# Patient Record
Sex: Female | Born: 1955 | Race: White | Hispanic: No | Smoking: Former smoker
Health system: Southern US, Community
[De-identification: ages and names within clinical notes are randomized; demographics above are authoritative.]

## PROBLEM LIST (undated history)

## (undated) DIAGNOSIS — Z9189 Other specified personal risk factors, not elsewhere classified: Secondary | ICD-10-CM

## (undated) DIAGNOSIS — E079 Disorder of thyroid, unspecified: Secondary | ICD-10-CM

## (undated) DIAGNOSIS — E7800 Pure hypercholesterolemia, unspecified: Secondary | ICD-10-CM

## (undated) DIAGNOSIS — I10 Essential (primary) hypertension: Secondary | ICD-10-CM

## (undated) DIAGNOSIS — I4891 Unspecified atrial fibrillation: Secondary | ICD-10-CM

## (undated) DIAGNOSIS — D219 Benign neoplasm of connective and other soft tissue, unspecified: Secondary | ICD-10-CM

## (undated) DIAGNOSIS — Z8639 Personal history of other endocrine, nutritional and metabolic disease: Secondary | ICD-10-CM

## (undated) DIAGNOSIS — E119 Type 2 diabetes mellitus without complications: Secondary | ICD-10-CM

## (undated) DIAGNOSIS — B1920 Unspecified viral hepatitis C without hepatic coma: Secondary | ICD-10-CM

## (undated) DIAGNOSIS — N912 Amenorrhea, unspecified: Secondary | ICD-10-CM

## (undated) HISTORY — DX: Amenorrhea, unspecified: N91.2

## (undated) HISTORY — DX: Benign neoplasm of connective and other soft tissue, unspecified: D21.9

## (undated) HISTORY — DX: Essential (primary) hypertension: I10

## (undated) HISTORY — DX: Other specified personal risk factors, not elsewhere classified: Z91.89

## (undated) HISTORY — DX: Unspecified atrial fibrillation: I48.91

## (undated) HISTORY — PX: THYROIDECTOMY, PARTIAL: SHX18

## (undated) HISTORY — DX: Personal history of other endocrine, nutritional and metabolic disease: Z86.39

## (undated) HISTORY — DX: Pure hypercholesterolemia, unspecified: E78.00

## (undated) HISTORY — DX: Unspecified viral hepatitis C without hepatic coma: B19.20

## (undated) HISTORY — DX: Type 2 diabetes mellitus without complications: E11.9

## (undated) HISTORY — DX: Disorder of thyroid, unspecified: E07.9

---

## 1997-01-31 HISTORY — PX: COLPOSCOPY: SHX161

## 1998-03-03 DIAGNOSIS — B1920 Unspecified viral hepatitis C without hepatic coma: Secondary | ICD-10-CM

## 1998-03-03 HISTORY — DX: Unspecified viral hepatitis C without hepatic coma: B19.20

## 1999-03-05 ENCOUNTER — Other Ambulatory Visit: Admission: RE | Admit: 1999-03-05 | Discharge: 1999-03-05 | Payer: Self-pay | Admitting: *Deleted

## 1999-03-24 ENCOUNTER — Other Ambulatory Visit: Admission: RE | Admit: 1999-03-24 | Discharge: 1999-03-24 | Payer: Self-pay | Admitting: *Deleted

## 1999-04-29 ENCOUNTER — Ambulatory Visit (HOSPITAL_COMMUNITY): Admission: RE | Admit: 1999-04-29 | Discharge: 1999-04-29 | Payer: Self-pay | Admitting: Internal Medicine

## 1999-04-29 ENCOUNTER — Encounter: Payer: Self-pay | Admitting: Obstetrics and Gynecology

## 2000-06-15 ENCOUNTER — Other Ambulatory Visit: Admission: RE | Admit: 2000-06-15 | Discharge: 2000-06-15 | Payer: Self-pay | Admitting: *Deleted

## 2001-07-20 ENCOUNTER — Other Ambulatory Visit: Admission: RE | Admit: 2001-07-20 | Discharge: 2001-07-20 | Payer: Self-pay | Admitting: Obstetrics and Gynecology

## 2001-08-31 HISTORY — PX: ENDOMETRIAL BIOPSY: SHX622

## 2002-08-21 ENCOUNTER — Other Ambulatory Visit: Admission: RE | Admit: 2002-08-21 | Discharge: 2002-08-21 | Payer: Self-pay | Admitting: Obstetrics and Gynecology

## 2002-12-02 HISTORY — PX: ENDOMETRIAL ABLATION: SHX621

## 2002-12-28 ENCOUNTER — Ambulatory Visit (HOSPITAL_BASED_OUTPATIENT_CLINIC_OR_DEPARTMENT_OTHER): Admission: RE | Admit: 2002-12-28 | Discharge: 2002-12-28 | Payer: Self-pay | Admitting: Obstetrics and Gynecology

## 2003-04-23 ENCOUNTER — Ambulatory Visit (HOSPITAL_COMMUNITY): Admission: RE | Admit: 2003-04-23 | Discharge: 2003-04-23 | Payer: Self-pay | Admitting: Obstetrics and Gynecology

## 2003-11-06 ENCOUNTER — Other Ambulatory Visit: Admission: RE | Admit: 2003-11-06 | Discharge: 2003-11-06 | Payer: Self-pay | Admitting: Obstetrics and Gynecology

## 2005-01-11 ENCOUNTER — Other Ambulatory Visit: Admission: RE | Admit: 2005-01-11 | Discharge: 2005-01-11 | Payer: Self-pay | Admitting: Obstetrics and Gynecology

## 2005-07-22 ENCOUNTER — Ambulatory Visit (HOSPITAL_COMMUNITY): Admission: RE | Admit: 2005-07-22 | Discharge: 2005-07-22 | Payer: Self-pay | Admitting: Obstetrics and Gynecology

## 2006-02-22 ENCOUNTER — Other Ambulatory Visit: Admission: RE | Admit: 2006-02-22 | Discharge: 2006-02-22 | Payer: Self-pay | Admitting: Obstetrics & Gynecology

## 2007-04-06 ENCOUNTER — Other Ambulatory Visit: Admission: RE | Admit: 2007-04-06 | Discharge: 2007-04-06 | Payer: Self-pay | Admitting: Obstetrics & Gynecology

## 2008-07-09 ENCOUNTER — Ambulatory Visit (HOSPITAL_COMMUNITY): Admission: RE | Admit: 2008-07-09 | Discharge: 2008-07-09 | Payer: Self-pay | Admitting: Obstetrics and Gynecology

## 2009-07-14 LAB — HM PAP SMEAR

## 2009-10-03 ENCOUNTER — Ambulatory Visit (HOSPITAL_COMMUNITY): Admission: RE | Admit: 2009-10-03 | Discharge: 2009-10-03 | Payer: Self-pay | Admitting: Obstetrics and Gynecology

## 2010-05-24 ENCOUNTER — Encounter: Payer: Self-pay | Admitting: Obstetrics and Gynecology

## 2010-09-18 NOTE — Op Note (Signed)
NAME:  Melissa Boyer, Melissa Boyer                       ACCOUNT NO.:  [id number]   MEDICAL RECORD NO.:  [id number]                   PATIENT TYPE:  AMB   LOCATION:  [facility]                                 FACILITY:  [facility]   PHYSICIAN:  Laqueta Linden, M.D.                 DATE OF BIRTH:  1956/01/17   DATE OF PROCEDURE:  12/28/2002  DATE OF DISCHARGE:                                 OPERATIVE REPORT   PREOPERATIVE DIAGNOSIS:  Menorrhagia with intramural fibroids.   POSTOPERATIVE DIAGNOSES:  1. Menorrhagia with intramural fibroids.  2. Submucosal fibroid.   OPERATION/PROCEDURE:  Hydrothermal endometrial ablation.   ANESTHESIA:  General LMA.   ESTIMATED BLOOD LOSS:  Negligible.   SORBITOL NET INTAKE:  None.   COMPLICATIONS:  None.   INDICATIONS:  Melissa Boyer is a 55 year old gravida 1, para 1,  perimenopausal female who has had persistent and erratic abnormal bleeding.  She has known uterine fibroids with a uterus which has been as large as 12  to 13 weeks size.  She was initially scheduled for hysterectomy which she  subsequently cancelled.  She presented again with complaints of prolonged  and persistent abnormal bleeding to undergo hysterectomy.  On examination  she was felt to clinically have some decrease in size of her uterus.  In  addition she had previously undergone a sonohysterogram which revealed no  evidence of any large or significant intrauterine fibroids.  Endometrial  biopsy revealed benign secretory endometrium and the uterine cavity sounded  to 10 cm.  She was offered the alternative of vaginal hysterectomy versus  hydrothermal ablation as an outpatient and chose the latter.  She and her  husband saw the informed consent film and voiced their understanding and  acceptance of all risks, benefits, alternatives, and complications,  including but not limited to anesthesia risks, infection, bleeding,  postoperative discomfort and complete temporary relief of bleeding,  possible  growth of the rest of her fibroids requiring surgical intervention in the  future as well as intraoperative risks including uterine perforation and  burns related to the fluid.  She has voiced her understanding and acceptance  of all risks and limitations and agrees to proceed.   DESCRIPTION OF PROCEDURE:  The patient was taken to the operating room and  after proper identification, consents were ascertained.  She was placed on  the operating table in the supine position.  After induction of general LMA  anesthesia she was placed in the [place] stirrups and the perineum and vagina  were prepped and draped in the routine  sterile fashion.  The bladder was  entered with a red rubber catheter.  The uterus was noted to be  approximately 10 weeks size anterior and irregular, consistent with serosal  fibroids.  The speculum was placed on the vagina and the cervix was grasped  with the single-tooth tenaculum.  The internal os was gently dilated to a  #21  Pratt dilator.  The hysteroscope with the appropriate connections was  then inserted under direct vision with continuous sorbitol flow.  There was  a tight seal around the cervix with no leakage noted.  The endocervical  canal was without lesion.  The left tubal ostia was visualized.  The tubal  ostia was obscured by an approximately 1 cm submucosal fibroid.  The  remainder of the cavity was free of any lesions.  It was quite atrophic  consistent with the patient's preoperative Lupron preparation.  The scope  was then withdrawn to the level of the internal os.  The test to rule out  uterine perforations was then performed and a three-minute flush was  performed with the fluid then heated to the appropriate temperature.  A 10-  minute ablation procedure was then performed which was monitored closely by  Florentina Addison from the company as well as the circulating nurse.  There were no  problems encountered.  The fluid was then cooled with a cool  flush for one  minute.  Postoperative endometrial blanching was documented on a photograph.  The scope was then withdrawn.  There was no evidence of any sorbitol leakage  or any leakage into the vagina throughout the procedure.  The tenaculum was  removed and was hemostatic.  The patient was stable and awake on transfer to  the recovery room.  She will be observed and discharged per anesthesia  protocol.   She will be given routine verbal and written discharge instructions.  Is  told to take Advil or Aleve as needed for any cramping.  She did receive  Toradol 30 mg IV on call to the OR and 30 mg IV prior to initiating the  procedure after she was asleep.  She is to take Advil or Aleve as needed and  follow up in the office in two to three weeks.  She was given routine  written instructions and is to call for any problems.                                                 Laqueta Linden, M.D.    LKS/MEDQ  D:  12/28/2002  T:  12/28/2002  Job:  [id number]

## 2013-01-11 ENCOUNTER — Encounter: Payer: Self-pay | Admitting: Nurse Practitioner

## 2013-01-11 ENCOUNTER — Ambulatory Visit: Payer: Self-pay | Admitting: Nurse Practitioner

## 2013-09-05 ENCOUNTER — Other Ambulatory Visit: Payer: Self-pay | Admitting: Nurse Practitioner

## 2013-09-05 DIAGNOSIS — Z1231 Encounter for screening mammogram for malignant neoplasm of breast: Secondary | ICD-10-CM

## 2013-09-11 ENCOUNTER — Ambulatory Visit (HOSPITAL_COMMUNITY)
Admission: RE | Admit: 2013-09-11 | Discharge: 2013-09-11 | Disposition: A | Discharge status: Home / Self Care | Payer: BC Managed Care – PPO | Source: Ambulatory Visit | Attending: Nurse Practitioner | Admitting: Nurse Practitioner

## 2013-09-11 DIAGNOSIS — Z1231 Encounter for screening mammogram for malignant neoplasm of breast: Secondary | ICD-10-CM

## 2013-10-22 ENCOUNTER — Ambulatory Visit: Payer: Self-pay | Admitting: Nurse Practitioner

## 2013-12-03 ENCOUNTER — Telehealth: Payer: Self-pay | Admitting: Nurse Practitioner

## 2013-12-03 NOTE — Telephone Encounter (Signed)
LMTCB/ °

## 2013-12-03 NOTE — Telephone Encounter (Signed)
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

## 2013-12-04 ENCOUNTER — Ambulatory Visit: Payer: Self-pay | Admitting: Nurse Practitioner

## 2014-01-01 ENCOUNTER — Other Ambulatory Visit: Payer: Self-pay | Admitting: Nurse Practitioner

## 2014-01-01 ENCOUNTER — Encounter: Payer: Self-pay | Admitting: Nurse Practitioner

## 2014-01-01 ENCOUNTER — Ambulatory Visit (INDEPENDENT_AMBULATORY_CARE_PROVIDER_SITE_OTHER): Payer: BC Managed Care – PPO | Admitting: Nurse Practitioner

## 2014-01-01 VITALS — BP 120/76 | HR 80 | Ht 66.5 in | Wt 222.0 lb

## 2014-01-01 DIAGNOSIS — E7800 Pure hypercholesterolemia, unspecified: Secondary | ICD-10-CM

## 2014-01-01 DIAGNOSIS — Z0000 Encounter for general adult medical examination without abnormal findings: Secondary | ICD-10-CM

## 2014-01-01 DIAGNOSIS — I10 Essential (primary) hypertension: Secondary | ICD-10-CM

## 2014-01-01 DIAGNOSIS — Z91B Personal risk factor of exposure to diethylstilbestrol: Secondary | ICD-10-CM

## 2014-01-01 DIAGNOSIS — E039 Hypothyroidism, unspecified: Secondary | ICD-10-CM

## 2014-01-01 DIAGNOSIS — N6310 Unspecified lump in the right breast, unspecified quadrant: Secondary | ICD-10-CM

## 2014-01-01 DIAGNOSIS — N63 Unspecified lump in unspecified breast: Secondary | ICD-10-CM

## 2014-01-01 DIAGNOSIS — Z9189 Other specified personal risk factors, not elsewhere classified: Secondary | ICD-10-CM | POA: Insufficient documentation

## 2014-01-01 DIAGNOSIS — Z1211 Encounter for screening for malignant neoplasm of colon: Secondary | ICD-10-CM

## 2014-01-01 DIAGNOSIS — Z803 Family history of malignant neoplasm of breast: Secondary | ICD-10-CM

## 2014-01-01 DIAGNOSIS — Z01419 Encounter for gynecological examination (general) (routine) without abnormal findings: Secondary | ICD-10-CM

## 2014-01-01 HISTORY — DX: Family history of malignant neoplasm of breast: Z80.3

## 2014-01-01 HISTORY — DX: Hypothyroidism, unspecified: E03.9

## 2014-01-01 HISTORY — DX: Pure hypercholesterolemia, unspecified: E78.00

## 2014-01-01 HISTORY — DX: Other specified personal risk factors, not elsewhere classified: Z91.89

## 2014-01-01 HISTORY — DX: Essential (primary) hypertension: I10

## 2014-01-01 NOTE — Progress Notes (Signed)
Patient ID: Melissa Boyer, female   DOB: 12-15-1955, 58 y.o.   MRN: [id number] 58 y.o. G60P1001 Married Caucasian Fe here for annual exam.  No vaso symptoms.  Some vaginal dryness. New right breast lump about 2 weeks ago. No history of trauma.  Patient's last menstrual period was 12/02/2002.          Sexually active: Yes.   occasionally. The current method of family planning is post menopausal status and ablation.    Exercising: No.  The patient does not participate in regular exercise at present. Smoker:  no  Health Maintenance: Pap:  3 years ago, [facility], normal MMG:  09/11/13, Bi-Rads 1:  Negative  Colonoscopy:  never BMD:   None recently TDaP:  ? Date in our old chart Labs: PCP   reports that she has never smoked. She has never used smokeless tobacco. She reports that she does not drink alcohol or use illicit drugs.  Past Medical History  Diagnosis Date  . DES exposure in utero   . Fibroids   . Hepatitis C 11/99    2 yr tx, "remission", told Ag neg  . Amenorrhea   . Thyroid disease     hypothyroid  . Hypertension   . Hypercholesteremia     Past Surgical History  Procedure Laterality Date  . Thyroidectomy, partial      thyroid nodule excision left side  . Colposcopy  10/98    normal, no biopsy  . Endometrial biopsy  5/03  . Endometrial ablation  12/02/2002    Current Outpatient Prescriptions  Medication Sig Dispense Refill  . ALPRAZolam (XANAX) 0.5 MG tablet Take 1 tablet by mouth as needed.      Marland Kitchen levothyroxine (SYNTHROID, LEVOTHROID) 125 MCG tablet Take 1 tablet by mouth daily.      Marland Kitchen losartan-hydrochlorothiazide (HYZAAR) 100-12.5 MG per tablet Take 1 tablet by mouth daily.      . meloxicam (MOBIC) 7.5 MG tablet Take 1 tablet by mouth daily.      . metoprolol succinate (TOPROL-XL) 100 MG 24 hr tablet Take 1 tablet by mouth daily.      . pravastatin (PRAVACHOL) 80 MG tablet Take 1 tablet by mouth daily.      . triazolam (HALCION) 0.25 MG tablet Take 2 tablets by mouth  daily.       No current facility-administered medications for this visit.    Family History  Problem Relation Age of Onset  . Cancer Mother     lung,   . Hypertension Father   . Emphysema Father   . Cancer Sister     tongue  . Cancer Maternal Aunt     colon, cervical, throat    ROS:  Pertinent items are noted in HPI.  Otherwise, a comprehensive ROS was negative.  Exam:   BP 120/76  Pulse 80  Ht 5' 6.5" (1.689 m)  Wt 222 lb (100.699 kg)  BMI 35.30 kg/m2  LMP 12/02/2002 Height: 5' 6.5" (168.9 cm)  Ht Readings from Last 3 Encounters:  01/01/14 5' 6.5" (1.689 m)    General appearance: alert, cooperative and appears stated age Head: Normocephalic, without obvious abnormality, atraumatic Neck: no adenopathy, supple, symmetrical, trachea midline and thyroid normal to inspection and palpation Lungs: clear to auscultation bilaterally Breasts: normal appearance, no masses or tenderness, right breast at 2:00 position at 2 1/2 inches from the areola is a firm round nontender mass at 1 cm. No nodes and no nipple discharge Heart: regular rate and rhythm  Abdomen: soft, non-tender; no masses,  no organomegaly Extremities: extremities normal, atraumatic, no cyanosis or edema Skin: Skin color, texture, turgor normal. No rashes or lesions Lymph nodes: Cervical, supraclavicular, and axillary nodes normal. No abnormal inguinal nodes palpated Neurologic: Grossly normal   Pelvic: External genitalia:  no lesions              Urethra:  normal appearing urethra with no masses, tenderness or lesions              Bartholin's and Skene's: normal                 Vagina: normal appearing vagina with normal color and discharge, no lesions              Cervix: anteverted              Pap taken: Yes.   Bimanual Exam:  Uterus:  normal size, contour, position, consistency, mobility, non-tender              Adnexa: no mass, fullness, tenderness               Rectovaginal: Confirms               Anus:   normal sphincter tone, no lesions  A:  Well Woman with normal exam  Postmenopausal no HRT  Atrophic vaginitis  History of right breast mass X 2 weeks  History of hypothyroid, Hep C 1999, HTN, hypercholesterolemia  History of DES exposure   Ward: breast cancer  P:   Reviewed health and wellness pertinent to exam  Pap smear taken today  Mammogram is due 09/2014 - but will get diagnostic to evaluate this new mass  Will also make a new referral for patient to see Dr. Collene Mares - several years ago she let her sister who had breast cancer to take her appointment and she failed to follow and reschedule for herself.  She will check to see if sister has had BRCA testing and get the results.  Counseled on breast self exam, mammography screening, adequate intake of calcium and vitamin D, diet and exercise, Kegel's exercises return annually or prn  An After Visit Summary was printed and given to the patient.

## 2014-01-01 NOTE — Patient Instructions (Signed)

## 2014-01-03 ENCOUNTER — Telehealth: Payer: Self-pay | Admitting: Nurse Practitioner

## 2014-01-03 LAB — IPS PAP TEST WITH HPV

## 2014-01-03 NOTE — Telephone Encounter (Signed)
Spoke with patient. Advised that she is scheduled with Dr Collene Mares 09.29.2015 @ [phone]. Gave her their telephone # because she would like to contact them to schedule an appointment later in the day.

## 2014-01-09 ENCOUNTER — Ambulatory Visit
Admission: RE | Admit: 2014-01-09 | Discharge: 2014-01-09 | Disposition: A | Discharge status: Home / Self Care | Payer: BC Managed Care – PPO | Source: Ambulatory Visit | Attending: Nurse Practitioner | Admitting: Nurse Practitioner

## 2014-01-09 ENCOUNTER — Telehealth: Payer: Self-pay | Admitting: Nurse Practitioner

## 2014-01-09 DIAGNOSIS — N6310 Unspecified lump in the right breast, unspecified quadrant: Secondary | ICD-10-CM

## 2014-01-09 NOTE — Telephone Encounter (Signed)
Patient calling from the parking lot of [facility] requesting to speak with nurse about their office "refusing to do the breast ultrasound that Patty ordered."

## 2014-01-09 NOTE — Telephone Encounter (Signed)
Spoke with patient at time of incoming call.  Patient at the [facility] now and has just seen radiologist.  Radiologist advised that imaging was normal and ultrasound not needed at this time. Patient very concerned as she and Melissa Cage, FNP discussed ultrasound and patient was expecting this imaging to evaluate palpable mass that patient states has been increasing in size over the last few months.  Called the breast center and spoke with Perry. Melissa Boyer advises that ultrasounds are ordered proactively but that does not necessarily mean that imaging will be done that it is up to the radiologists discretion. Melissa Boyer would not schedule ultrasound at this time.  Spoke with Melissa Cage, FNP, advise patient to come here for breast recheck with MD and can discuss if needs additional imaging.  Spoke with patient again and advised of plan. Patient expresses concern over not having ultrasound. Patient states that she feels radiologist may have had her confused with another patient because she mentioned a previous biopsy which patient has not had. Patient expresses concern about palpable area and feels that imaging did not completely assess area. Patient concerned about family hx of cancer and recent negative 3D Mammogram. Patient is agreeable to office visit for breast recheck. Patient is scheduled for Friday 01/11/14 at 1130 with Dr. Quincy Boyer for breast check. Advised she can discuss her concerns with Dr. Quincy Boyer. Patient is agreeable to this.   Routing to [employer], FNP and Dr. Quincy Boyer.   Routing to provider for final review. Patient agreeable to disposition. Will close encounter

## 2014-01-11 ENCOUNTER — Ambulatory Visit (INDEPENDENT_AMBULATORY_CARE_PROVIDER_SITE_OTHER): Payer: BC Managed Care – PPO | Admitting: Obstetrics and Gynecology

## 2014-01-11 ENCOUNTER — Encounter: Payer: Self-pay | Admitting: Obstetrics and Gynecology

## 2014-01-11 VITALS — BP 130/90 | HR 68 | Ht 66.5 in | Wt 224.0 lb

## 2014-01-11 DIAGNOSIS — R21 Rash and other nonspecific skin eruption: Secondary | ICD-10-CM

## 2014-01-11 DIAGNOSIS — N63 Unspecified lump in unspecified breast: Secondary | ICD-10-CM

## 2014-01-11 DIAGNOSIS — N6310 Unspecified lump in the right breast, unspecified quadrant: Secondary | ICD-10-CM

## 2014-01-11 NOTE — Progress Notes (Signed)
GYNECOLOGY VISIT  PCP:  Micheal Likens.   Referring provider:   HPI: 58 y.o.   Married  Caucasian  female   G1P1001 with Patient's last menstrual period was 12/02/2002.   here for   Right breast check. 4 - 5 months ago felt a right sided lump size of a pea.  Had 3D mammogram in May which was normal.  Had annual examination and lump in right breast noted to be about quarter sized.  Had a diagnostic mammogram and was expecting an ultrasound also.  Told the study was normal.  Was told, "There was not change since her last biopsy." Patient very worried about lump.  Wants validation that she had a lump. She can feel it, her husband can feel it. Patient's maternal cousin with breast cancer.   Rash on skin.  Possible spider bite.  Denies tick bite.  Denies fevers of headaches.   Hgb:  na Urine:  na  GYNECOLOGIC HISTORY: Patient's last menstrual period was 12/02/2002. Sexually active:  yes Partner preference: female Contraception:  postmenopausal  Menopausal hormone therapy: no DES exposure:   no Blood transfusions: no   Sexually transmitted diseases: no    GYN procedures and prior surgeries: Colposcopy, Endometrial Bx, Endometrial Ablation  Last mammogram:    01/09/14 Bi-Rads Neg             Last pap and high risk HPV testing:  3 years ago, [place], normal  History of abnormal pap smear:  no   OB History   Grav Para Term Preterm Abortions TAB SAB Ect Mult Living   1 1 1       1        LIFESTYLE: Exercise:  Light activity             Tobacco: no Alcohol:no Drug use:no    Patient Active Problem List   Diagnosis Date Noted  . 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 Family history of breast cancer in first degree relative 01/01/2014  . Essential hypertension 01/01/2014  . Pure hypercholesterolemia 01/01/2014  . Unspecified hypothyroidism 01/01/2014    Past Medical History  Diagnosis Date  . DES exposure in utero   . Fibroids   . Hepatitis C 11/99    2  yr tx, "remission", told Ag neg  . Amenorrhea   . Thyroid disease     hypothyroid  . Hypertension   . Hypercholesteremia     Past Surgical History  Procedure Laterality Date  . Thyroidectomy, partial      thyroid nodule excision left side  . Colposcopy  10/98    normal, no biopsy  . Endometrial biopsy  5/03  . Endometrial ablation  12/02/2002    Current Outpatient Prescriptions  Medication Sig Dispense Refill  . ALPRAZolam (XANAX) 0.5 MG tablet Take 1 tablet by mouth as needed.      Marland Kitchen levothyroxine (SYNTHROID, LEVOTHROID) 125 MCG tablet Take 1 tablet by mouth daily.      Marland Kitchen losartan-hydrochlorothiazide (HYZAAR) 100-12.5 MG per tablet Take 1 tablet by mouth daily.      . meloxicam (MOBIC) 7.5 MG tablet Take 1 tablet by mouth daily.      . metoprolol succinate (TOPROL-XL) 100 MG 24 hr tablet Take 1 tablet by mouth daily.      . pravastatin (PRAVACHOL) 80 MG tablet Take 1 tablet by mouth daily.      . triazolam (HALCION) 0.25 MG tablet Take 2 tablets by mouth daily.  No current facility-administered medications for this visit.     ALLERGIES: Review of patient's allergies indicates no known allergies.  Family History  Problem Relation Age of Onset  . Cancer Mother     lung,   . Hypertension Father   . Emphysema Father   . Cancer Sister     tongue  . Cancer Maternal Aunt     colon, cervical, throat    History   Social History  . Marital Status: Married    Spouse Name: N/A    Number of Children: 1  . Years of Education: N/A   Occupational History  . Not on file.   Social History Main Topics  . Smoking status: Never Smoker   . Smokeless tobacco: Never Used  . Alcohol Use: No  . Drug Use: No  . Sexual Activity: Not Currently    Partners: Male   Other Topics Concern  . Not on file   Social History Narrative  . No narrative on file    ROS:  Pertinent items are noted in HPI.  PHYSICAL EXAMINATION:    BP 130/90  Pulse 68  Ht 5' 6.5" (1.689 m)  Wt  224 lb (101.606 kg)  BMI 35.62 kg/m2  LMP 12/02/2002   Wt Readings from Last 3 Encounters:  01/11/14 224 lb (101.606 kg)  01/01/14 222 lb (100.699 kg)     Ht Readings from Last 3 Encounters:  01/11/14 5' 6.5" (1.689 m)  01/01/14 5' 6.5" (1.689 m)    General appearance: alert, cooperative and appears stated age  Breasts: Inspection negative, No nipple retraction or dimpling, No nipple discharge or bleeding, No axillary or supraclavicular adenopathy, Normal to palpation without dominant masses on left.  Right breast with a ridge across breast tissue at 2 o'clock position. Skin:  Red target like rash of left side.  Has central whitish pimple like area.   ASSESSMENT  Right breast lump.  Change in self exam.  Negative diagnostic mammogram at [facility]. No ultrasound done.  Rash of left side abdominal skin.  PLAN  Will get second opinion at [facility].  Send records and request ultrasound of right breast.  Told patient that she may also have a general surgery consultation, even if all diagnostic studies are negative as there is no perfect method for detection of breast cancer.  Will follow up with PCP or urgent care for insect bite.  Our lab is closed and we cannot do any studies to rule out tick bite related infections.   An After Visit Summary was printed and given to the patient.  25 minutes face to face time of which over 50% was spent in counseling.

## 2014-01-11 NOTE — Patient Instructions (Signed)
We will call you on Monday next week to schedule an appointment at [facility] for a second opinion about your right breast lump.

## 2014-01-14 ENCOUNTER — Telehealth: Payer: Self-pay | Admitting: Obstetrics and Gynecology

## 2014-01-14 NOTE — Telephone Encounter (Signed)
Patient returning [name]'s call. °

## 2014-01-14 NOTE — Telephone Encounter (Signed)
Order faxed with cover sheet to [facility] mammography.Spoke with patient. Appointment scheduled for 9/16 at 11:30am at [facility] mammography for right breast ultrasound. Patient agreeable to date and time.  Routing to provider for final review. Patient agreeable to disposition. Will close encounter

## 2014-01-14 NOTE — Telephone Encounter (Signed)
Patient states that she was told that Olivia Mackie was going to get everything arranged and give her a call today with an appointment for a right breast ultrasound. Advised patient have order to [place]. Patient prefers Monday, Wednesday, Friday appointment. Advised would get results sent to [facility] and get appointment scheduled and call patient back. Advised may need to sign release with the Breast Center for Imaging to be sent. Spoke with [facility] who will need imaging and radiology report. Spoke with [facility] who will need patient to call over to get imaging sent to [facility]. Spoke with patient. Advised of the need to speak with the Breast Center to get results sent to [facility] Patient is agreeable and will speak with them and call back so that I can call [facility] to schedule.

## 2014-01-14 NOTE — Telephone Encounter (Signed)
Pt calling to schedule ultrasound appointment.

## 2014-01-14 NOTE — Telephone Encounter (Signed)
Pt ca;;ed Melissa Boyer back during lunch said that she called the breast center and her images will be sent.

## 2014-01-29 ENCOUNTER — Telehealth: Payer: Self-pay | Admitting: Emergency Medicine

## 2014-01-29 NOTE — Telephone Encounter (Signed)
Calling patient with message from Dr. Quincy Simmonds.  Normal results from second opinion at [facility] with Breast Ultrasound.   Does patient want referral to general surgery for evaluation of R breast lump? Can place referral if patient would like referral to [facility]. Possible Dr. Marlou Starks,, breast surgeon.

## 2014-01-30 NOTE — Progress Notes (Signed)
Encounter reviewed by Dr. Genean Adamski Silva.  

## 2014-02-01 NOTE — Telephone Encounter (Signed)
Spoke with patient. Patient states that she spoke with MD at second opinion appointment. States that right now she would like to hold off and she is anything changes before seeing general surgery. Will call back if she changes her mind.  Routing to provider for final review. Patient agreeable to disposition. Will close encounter

## 2014-03-04 ENCOUNTER — Encounter: Payer: Self-pay | Admitting: Obstetrics and Gynecology

## 2014-11-07 ENCOUNTER — Telehealth: Payer: Self-pay | Admitting: Emergency Medicine

## 2014-11-07 NOTE — Telephone Encounter (Signed)
-----   Message from Nunzio Cobbs, MD sent at 11/07/2014 12:04 AM EDT ----- Regarding: RE: Mammogram hold  OK to remove from mammogram hold.   Josefa Half ----- Message -----    From: Michele Mcalpine, RN    Sent: 11/06/2014   1:08 PM      To: Brook Oletta Lamas, MD Subject: Mammogram hold                                 Dr. Quincy Simmonds, this patient has been in mammogram hold.  She has elected to wait for evaluation by general surgery. Verline Lema spoke with her via telephone 02/01/14. Okay to remove from hold?

## 2015-01-13 ENCOUNTER — Ambulatory Visit: Payer: Self-pay | Admitting: Nurse Practitioner

## 2015-03-05 ENCOUNTER — Encounter: Payer: Self-pay | Admitting: Nurse Practitioner

## 2015-03-05 ENCOUNTER — Ambulatory Visit (INDEPENDENT_AMBULATORY_CARE_PROVIDER_SITE_OTHER): Payer: 59 | Admitting: Nurse Practitioner

## 2015-03-05 VITALS — BP 122/76 | HR 60 | Ht 66.25 in | Wt 242.0 lb

## 2015-03-05 DIAGNOSIS — Z01419 Encounter for gynecological examination (general) (routine) without abnormal findings: Secondary | ICD-10-CM

## 2015-03-05 DIAGNOSIS — D259 Leiomyoma of uterus, unspecified: Secondary | ICD-10-CM | POA: Diagnosis not present

## 2015-03-05 DIAGNOSIS — Z1211 Encounter for screening for malignant neoplasm of colon: Secondary | ICD-10-CM

## 2015-03-05 DIAGNOSIS — I10 Essential (primary) hypertension: Secondary | ICD-10-CM | POA: Diagnosis not present

## 2015-03-05 DIAGNOSIS — Z0000 Encounter for general adult medical examination without abnormal findings: Secondary | ICD-10-CM

## 2015-03-05 DIAGNOSIS — Z803 Family history of malignant neoplasm of breast: Secondary | ICD-10-CM

## 2015-03-05 DIAGNOSIS — Z23 Encounter for immunization: Secondary | ICD-10-CM | POA: Diagnosis not present

## 2015-03-05 DIAGNOSIS — N949 Unspecified condition associated with female genital organs and menstrual cycle: Secondary | ICD-10-CM | POA: Diagnosis not present

## 2015-03-05 DIAGNOSIS — Z91B Personal risk factor of exposure to diethylstilbestrol: Secondary | ICD-10-CM

## 2015-03-05 DIAGNOSIS — Z9189 Other specified personal risk factors, not elsewhere classified: Secondary | ICD-10-CM

## 2015-03-05 NOTE — Progress Notes (Signed)
Patient ID: Melissa Boyer, female   DOB: 1956-04-28, 59 y.o.   MRN: [id number] 59 y.o. G32P1001 Married  Caucasian Fe here for annual exam.  Doing well.  She has a new grand  baby - a week old.  She would like update on her TDaP.  She was also told that she had a fairly large uterine fibroid on recent ?CT/MRI of her back.  She has a history of fibroids but is having no pain or bleeding.  She also has a peri rectal skin tag/ mole that she would like checked.  Patient's last menstrual period was 12/02/2002 (approximate).          Sexually active: No.  The current method of family planning is post menopausal status.    Exercising: No.  The patient does not participate in regular exercise at present. Smoker:  no  Health Maintenance: Pap:01/01/14, Negative with neg HR HPV MMG: 09/11/13, 3D, Bi-Rads 1: Negative; Diagnostic Right 01/09/14; Bi-Rads 1: Negative Colonoscopy: Never (will send to see Dr. Collene Mares) NID:[id number] TDaP:will update today Labs: PCP in 12/2014   reports that she has never smoked. She has never used smokeless tobacco. She reports that she does not drink alcohol or use illicit drugs.  Past Medical History  Diagnosis Date  . DES exposure in utero   . Fibroids   . Hepatitis C 11/99    2 yr tx, "remission", told Ag neg  . Amenorrhea   . Thyroid disease     hypothyroid  . Hypertension   . Hypercholesteremia     Past Surgical History  Procedure Laterality Date  . Thyroidectomy, partial      thyroid nodule excision left side  . Colposcopy  10/98    normal, no biopsy  . Endometrial biopsy  5/03  . Endometrial ablation  12/02/2002    Current Outpatient Prescriptions  Medication Sig Dispense Refill  . ALPRAZolam (XANAX) 0.5 MG tablet Take 1 tablet by mouth as needed.    Marland Kitchen levothyroxine (SYNTHROID, LEVOTHROID) 125 MCG tablet Take 1 tablet by mouth daily.    Marland Kitchen losartan-hydrochlorothiazide (HYZAAR) 100-12.5 MG per tablet Take 1 tablet by mouth daily.    . meloxicam (MOBIC)  7.5 MG tablet Take 1 tablet by mouth daily.    . metoprolol succinate (TOPROL-XL) 100 MG 24 hr tablet Take 1 tablet by mouth daily.    . pravastatin (PRAVACHOL) 80 MG tablet Take 1 tablet by mouth daily.    . triazolam (HALCION) 0.25 MG tablet Take 2 tablets by mouth daily.     No current facility-administered medications for this visit.    Family History  Problem Relation Age of Onset  . Cancer Mother     lung,   . Hypertension Father   . Emphysema Father   . Cancer Sister     tongue  . Cancer Maternal Aunt     colon, cervical, throat    ROS:  Pertinent items are noted in HPI.  Otherwise, a comprehensive ROS was negative.  Exam:   BP 122/76 mmHg  Pulse 60  Ht 5' 6.25" (1.683 m)  Wt 242 lb (109.77 kg)  BMI 38.75 kg/m2  LMP 12/02/2002 (Approximate) Height: 5' 6.25" (168.3 cm) Ht Readings from Last 3 Encounters:  03/05/15 5' 6.25" (1.683 m)  01/11/14 5' 6.5" (1.689 m)  01/01/14 5' 6.5" (1.689 m)    General appearance: alert, cooperative and appears stated age Head: Normocephalic, without obvious abnormality, atraumatic Neck: no adenopathy, supple, symmetrical, trachea midline and thyroid  normal to inspection and palpation Lungs: clear to auscultation bilaterally Breasts: normal appearance, no masses or tenderness Heart: regular rate and rhythm Abdomen: soft, non-tender; no masses,  no organomegaly Extremities: extremities normal, atraumatic, no cyanosis or edema Skin: Skin color, texture, turgor normal. No rashes or lesions Lymph nodes: Cervical, supraclavicular, and axillary nodes normal. No abnormal inguinal nodes palpated Neurologic: Grossly normal   Pelvic: External genitalia:  no lesions              Urethra:  normal appearing urethra with no masses, tenderness or lesions              Bartholin's and Skene's: normal                 Vagina: normal appearing vagina with normal color and discharge, no lesions              Cervix: anteverted              Pap  taken: No. Bimanual Exam:  Uterus:  normal size, contour, position, consistency, mobility, non-tender              Adnexa: no mass, fullness, tenderness  Exam is limited due to body habitus               Rectovaginal: Confirms               Anus:  normal sphincter tone, no lesions  Chaperone present: no  A:  Well Woman with normal exam  Postmenopausal no HRT Atrophic vaginitis History of hypothyroid, Hep C 1999, HTN, hypercholesterolemia History of DES exposure, history of uterine fibroids [place]: breast cancer  Update immunization   P:   Reviewed health and wellness pertinent to exam  Pap smear as above  Mammogram is due now and will schedule  TDaP is given  Will place order for skin tag/ mole removal  IFOB is given  Will get report of CT/MRI and follow and see if we need to do anything about fibroid  Counseled on breast self exam, mammography screening, adequate intake of calcium and vitamin D, diet and exercise, Kegel's exercises return annually or prn  An After Visit Summary was printed and given to the patient.

## 2015-03-05 NOTE — Patient Instructions (Signed)

## 2015-03-06 NOTE — Progress Notes (Signed)
Encounter reviewed by Dr. Aundria Rud. Pap and HR HPV testing will be done at appointment for skin tag removal.

## 2015-03-14 ENCOUNTER — Other Ambulatory Visit: Payer: Self-pay | Admitting: Nurse Practitioner

## 2015-03-14 ENCOUNTER — Telehealth: Payer: Self-pay | Admitting: Nurse Practitioner

## 2015-03-14 DIAGNOSIS — R937 Abnormal findings on diagnostic imaging of other parts of musculoskeletal system: Secondary | ICD-10-CM

## 2015-03-14 NOTE — Telephone Encounter (Signed)
Patient called stating she is returning Tracy's call.

## 2015-03-14 NOTE — Telephone Encounter (Signed)
Patient returned call. She states she would like to wait for ultrasound until the beginning of the year. She states "I don't think this is a huge deal and I would like to wait."  Patient is advised to call back when she is ready to schedule.  cc Fortino Sic, FNP  Dr. Quincy Simmonds

## 2015-03-14 NOTE — Addendum Note (Signed)
Addended by: Michele Mcalpine on: 03/14/2015 02:13 PM   Modules accepted: Orders

## 2015-03-14 NOTE — Telephone Encounter (Signed)
Patient is informed that we now have Lumbar Spine X rays from 03/03/2010.  There is a "probable calcified fibroid vs vascular or less likely bowel related" .  I have discussed this with De. Quincy Simmonds and feel that a PUS would be good to further evaluate the "calcified fibroid'.  She has had a history of fibroids in the past - but paper chart is in storage and not available for comparison.  Will get PUS and follow.  Order is placed.

## 2015-03-16 NOTE — Telephone Encounter (Signed)
I think that it is OK to wait until beginning of next year.   Cc- Olivia Mackie Fast and [employer]

## 2015-05-26 ENCOUNTER — Telehealth: Payer: Self-pay | Admitting: Obstetrics & Gynecology

## 2015-05-26 NOTE — Telephone Encounter (Signed)
Spoke with pt regarding benefit for ultrasound. Patient understood and agreeable. Patient ready to schedule. Patient scheduled 06/19/15 with miller. Pt aware of arrival date and time. Pt aware of 72 hours cancellation policy with [id number] fee. No further questions. Ok to close

## 2015-06-19 ENCOUNTER — Other Ambulatory Visit: Payer: 59 | Admitting: Obstetrics & Gynecology

## 2015-06-19 ENCOUNTER — Other Ambulatory Visit: Payer: 59

## 2015-07-22 ENCOUNTER — Telehealth: Payer: Self-pay | Admitting: Nurse Practitioner

## 2015-07-22 NOTE — Telephone Encounter (Signed)
Order for 3D screening

## 2015-07-22 NOTE — Telephone Encounter (Signed)
After review of past Mammo and [place] and her exams.  It is OK to precede with screening 3D Mammo.

## 2015-07-22 NOTE — Telephone Encounter (Signed)
Spoke with patient. Patient states that she has a lump in her right breast that has been present for many years. Last mammogram was performed on 09/11/2013. Had a unilateral right diagnostic mammogram on 01/09/2014 and a unilateral right ultrasound on 01/16/2014. Routine mammogram was recommended in 12 months. Patient states the lump is still present in the right breast and she would like to proceed with her screening mammogram. Reports the lump is in the same area of the breast breast, but is a little tender to the touch. Patient would like to proceed with mammogram at this time. Advised I will speak with Kem Boroughs, FNP regarding imaging recommendation and return call. She is agreeable.  Kem Boroughs, FNP is it okay for the patient to have a 3D screening mammogram at this time or will she need diagnostic imaging? All results from previous imaging are available in EPIC.

## 2015-07-22 NOTE — Telephone Encounter (Signed)
Order for 3D screening mammogram to Kem Boroughs, FNP for review and signature for fax to [facility].

## 2015-07-22 NOTE — Telephone Encounter (Signed)
Patient has some questions regarding a previous mammogram. Patient also has a breast lump that is being followed. Patient says the lump feels a little different  "feels like a stitch".

## 2015-12-02 NOTE — Telephone Encounter (Signed)
Okay to close encounter?  °

## 2016-03-08 ENCOUNTER — Ambulatory Visit: Payer: 59 | Admitting: Nurse Practitioner

## 2016-03-23 ENCOUNTER — Encounter: Payer: Self-pay | Admitting: Nurse Practitioner

## 2016-03-23 ENCOUNTER — Ambulatory Visit (INDEPENDENT_AMBULATORY_CARE_PROVIDER_SITE_OTHER): Payer: BLUE CROSS/BLUE SHIELD | Admitting: Nurse Practitioner

## 2016-03-23 VITALS — BP 130/84 | HR 72 | Ht 66.25 in | Wt 246.0 lb

## 2016-03-23 DIAGNOSIS — Z01419 Encounter for gynecological examination (general) (routine) without abnormal findings: Secondary | ICD-10-CM | POA: Diagnosis not present

## 2016-03-23 DIAGNOSIS — Z803 Family history of malignant neoplasm of breast: Secondary | ICD-10-CM

## 2016-03-23 DIAGNOSIS — Z9189 Other specified personal risk factors, not elsewhere classified: Secondary | ICD-10-CM | POA: Diagnosis not present

## 2016-03-23 DIAGNOSIS — I10 Essential (primary) hypertension: Secondary | ICD-10-CM

## 2016-03-23 NOTE — Progress Notes (Signed)
Patient scheduled at [facility] mammography while in office for right breast diagnostic MMG with Ultrasound if needed -04/06/16 at 0915. Patient agreeable to date and time.

## 2016-03-23 NOTE — Patient Instructions (Signed)

## 2016-03-23 NOTE — Progress Notes (Signed)
Patient ID: Melissa Boyer, female   DOB: 1956/03/13, 60 y.o.   MRN: [id number]  60 y.o. G78P1001 Married  Caucasian Fe here for annual exam.  Has had a lot of back pain and has been out of Mobic.  She is going to restart.  She has tenderness in right breast that is noted with turning over in bed or with hugging grandchildren.  She can feel a deep tender spot and at times thinks there is a mass.  Her Mammo was done in March and US done to evaluate the right breast without any concerns.  She thinks still feels like a similar location.  Patient's last menstrual period was 12/02/2002 (approximate).          Sexually active: No.  The current method of family planning is post menopausal status.    Exercising: No.  The patient does not participate in regular exercise at present. Smoker:  no  Health Maintenance: Pap:01/01/14, Negative with neg HR HPV   (HX of DES exposure - pap yearly until age 78, then every 3 yrs.) MMG:07/29/15, 3D with ultrasound, Bi-Rads 2: Benign Findings Colonoscopy: Never - will discuss with PCP [id number] TDaP: 03/05/15 Shingles: Never but will check with PCP Pneumonia: Not indicated due to age Labs: PCP takes care of all labs   reports that she has never smoked. She has never used smokeless tobacco. She reports that she does not drink alcohol or use drugs.  Past Medical History:  Diagnosis Date  . Amenorrhea   . DES exposure in utero   . Fibroids   . Hepatitis C 11/99   2 yr tx, "remission", told Ag neg  . Hypercholesteremia   . Hypertension   . Thyroid disease    hypothyroid    Past Surgical History:  Procedure Laterality Date  . COLPOSCOPY  10/98   normal, no biopsy  . ENDOMETRIAL ABLATION  12/02/2002  . ENDOMETRIAL BIOPSY  5/03  . THYROIDECTOMY, PARTIAL     thyroid nodule excision left side    Current Outpatient Prescriptions  Medication Sig Dispense Refill  . ALPRAZolam (XANAX) 0.5 MG tablet Take 1 tablet by mouth as needed.    Marland Kitchen levothyroxine  (SYNTHROID, LEVOTHROID) 125 MCG tablet Take 1 tablet by mouth daily.    Marland Kitchen losartan-hydrochlorothiazide (HYZAAR) 100-12.5 MG per tablet Take 1 tablet by mouth daily.    . meloxicam (MOBIC) 7.5 MG tablet Take 1 tablet by mouth daily.    . metoprolol succinate (TOPROL-XL) 100 MG 24 hr tablet Take 1 tablet by mouth daily.    . pravastatin (PRAVACHOL) 80 MG tablet Take 1 tablet by mouth daily.    . triazolam (HALCION) 0.25 MG tablet Take 2 tablets by mouth daily.     No current facility-administered medications for this visit.     Family History  Problem Relation Age of Onset  . Cancer Mother     lung,   . Hypertension Father   . Emphysema Father   . Cancer Sister     tongue  . Cancer Maternal Aunt     colon, cervical, throat    ROS:  Pertinent items are noted in HPI.  Otherwise, a comprehensive ROS was negative.  Exam:   LMP 12/02/2002 (Approximate)    Ht Readings from Last 3 Encounters:  03/05/15 5' 6.25" (1.683 m)  01/11/14 5' 6.5" (1.689 m)  01/01/14 5' 6.5" (1.689 m)    General appearance: alert, cooperative and appears stated age Head: Normocephalic, without obvious abnormality, atraumatic  Neck: no adenopathy, supple, symmetrical, trachea midline and thyroid normal to inspection and palpation Lungs: clear to auscultation bilaterally Breasts: normal appearance, no masses or tenderness, for the left breast.  The right breast has some generalized tenderness at the 2:00 position close to the areola.  The breast are fibrocyctic and no mass can be felt.  No adenopathy.  Heart: regular rate and rhythm Abdomen: soft, non-tender; no masses,  no organomegaly Extremities: extremities normal, atraumatic, no cyanosis or edema Skin: Skin color, texture, turgor normal. No rashes or lesions Lymph nodes: Cervical, supraclavicular, and axillary nodes normal. No abnormal inguinal nodes palpated Neurologic: Grossly normal   Pelvic: External genitalia:  no lesions              Urethra:   normal appearing urethra with no masses, tenderness or lesions              Bartholin's and Skene's: normal                 Vagina: normal appearing vagina with normal color and discharge, no lesions              Cervix: anteverted              Pap taken: Yes.   Bimanual Exam:  Uterus:  normal size, contour, position, consistency, mobility, non-tender              Adnexa: no mass, fullness, tenderness               Rectovaginal: Confirms               Anus:  normal sphincter tone, no lesions  Chaperone present: yes  A:  Well Woman with normal exam  Postmenopausal no HRT Atrophic vaginitis History of hypothyroid, Hep C 1999, HTN, hypercholesterolemia History of DES exposure, history of uterine fibroids [place]: breast cancer             Update immunization   P:   Reviewed health and wellness pertinent to exam  Pap smear as above  Mammogram will be scheduled for diagnostic and [place] if needed  Counseled on breast self exam, mammography screening, adequate intake of calcium and vitamin D, diet and exercise return annually or prn  An After Visit Summary was printed and given to the patient.

## 2016-03-24 NOTE — Progress Notes (Signed)
Reviewed personally.  M. Suzanne Siyana Erney, MD.  

## 2016-03-26 LAB — PAP IG (IMAGE GUIDED)

## 2016-05-06 ENCOUNTER — Encounter: Payer: Self-pay | Admitting: Nurse Practitioner

## 2016-05-21 ENCOUNTER — Telehealth: Payer: Self-pay | Admitting: Nurse Practitioner

## 2016-05-21 NOTE — Telephone Encounter (Signed)
Called and left patient a message to call back and ask for Melissa Boyer re: need to locate MRI or CT scan done prior to her AEX on 03/2015.  On an audit, we never received a copy of this for review.  Cc: Gay Filler for [employer].

## 2016-05-24 NOTE — Telephone Encounter (Signed)
I called and spoke with the patient about this.  She said, "The only imaging I recall having done around that time was a breast ultrasound in 2015.  I don't remember any MRI or CT scan in the last few years but a lot has gone on for me.  I have had some more breast issues but am getting care for that at [facility] and you all don't need to help me with that right now.  I am glad you called but I am doing fine and don't think I need anything right now."

## 2016-05-24 NOTE — Telephone Encounter (Signed)
Call to patient, Left message to call back. Calling to follow-up on pelvic ultrasound that was ordered but never completed. It was ordered due to fibroid seen on pelvic MRI but we never received these results.

## 2016-06-07 NOTE — Telephone Encounter (Signed)
Call to patient. Left message calling to follow-up reagrding an appointment. Nothing wrong. Call back to [place] at her convenience.

## 2016-06-29 NOTE — Telephone Encounter (Signed)
Patient has not returned calls. Okay to close encounter or is further follow up needed?

## 2016-06-29 NOTE — Telephone Encounter (Addendum)
No patient response. Please advise on next step.

## 2016-06-30 NOTE — Telephone Encounter (Signed)
Patient has a normal pelvic exam documented in November 2017.  I am closing the encounter.

## 2017-04-04 ENCOUNTER — Ambulatory Visit: Payer: BLUE CROSS/BLUE SHIELD | Admitting: Nurse Practitioner

## 2017-04-05 ENCOUNTER — Ambulatory Visit: Payer: BLUE CROSS/BLUE SHIELD | Admitting: Certified Nurse Midwife

## 2017-05-26 ENCOUNTER — Ambulatory Visit: Payer: BLUE CROSS/BLUE SHIELD | Admitting: Certified Nurse Midwife

## 2017-05-26 ENCOUNTER — Telehealth: Payer: Self-pay | Admitting: Certified Nurse Midwife

## 2017-05-26 NOTE — Telephone Encounter (Signed)
Patient cancelled appointment for today because she has the flu. No dnka fee or letter.

## 2017-05-26 NOTE — Progress Notes (Deleted)
62 y.o. G1P1001 Married  {Race/ethnicity:17218} Fe here for annual exam.    Patient's last menstrual period was 12/02/2002 (approximate).          Sexually active: {yes no:314532}  The current method of family planning is post menopausal status.    Exercising: {yes no:314532}  {types:19826} Smoker:  {YES NO:22349}  Health Maintenance: Pap:  01/01/14 neg HPV HR neg (Hx of DES exposure, pap yearly until age 80, then every 13yrs), 03-23-16 neg , 16/18 genotype neg History of Abnormal Pap: {YES NO:22349} MMG:  07-29-15 bilateral & f/u neded 04-06-16 rt breast u/s neg Self Breast exams: {YES NO:22349} Colonoscopy:  *** BMD:   *** TDaP:  2016 Shingles: *** Pneumonia: *** Hep C and HIV: *** Labs: ***   reports that  has never smoked. she has never used smokeless tobacco. She reports that she does not drink alcohol or use drugs.  Past Medical History:  Diagnosis Date  . Amenorrhea   . DES exposure in utero   . Fibroids   . Hepatitis C 11/99   2 yr tx, "remission", told Ag neg  . Hypercholesteremia   . Hypertension   . Thyroid disease    hypothyroid    Past Surgical History:  Procedure Laterality Date  . COLPOSCOPY  10/98   normal, no biopsy  . ENDOMETRIAL ABLATION  12/02/2002  . ENDOMETRIAL BIOPSY  5/03  . THYROIDECTOMY, PARTIAL     thyroid nodule excision left side    Current Outpatient Medications  Medication Sig Dispense Refill  . ALPRAZolam (XANAX) 0.5 MG tablet Take 1 tablet by mouth as needed.    Marland Kitchen levothyroxine (SYNTHROID, LEVOTHROID) 125 MCG tablet Take 1 tablet by mouth daily.    Marland Kitchen losartan-hydrochlorothiazide (HYZAAR) 100-12.5 MG per tablet Take 1 tablet by mouth daily.    . meloxicam (MOBIC) 7.5 MG tablet Take 1 tablet by mouth daily.    . metoprolol succinate (TOPROL-XL) 100 MG 24 hr tablet Take 1 tablet by mouth daily.    . pravastatin (PRAVACHOL) 80 MG tablet Take 1 tablet by mouth daily.    . triazolam (HALCION) 0.25 MG tablet Take 2 tablets by mouth daily.      No current facility-administered medications for this visit.     Family History  Problem Relation Age of Onset  . Cancer Mother        lung,   . Hypertension Father   . Emphysema Father   . Cancer Sister        tongue  . Cancer Maternal Aunt        colon, cervical, throat    ROS:  Pertinent items are noted in HPI.  Otherwise, a comprehensive ROS was negative.  Exam:   LMP 12/02/2002 (Approximate)    Ht Readings from Last 3 Encounters:  03/23/16 5' 6.25" (1.683 m)  03/05/15 5' 6.25" (1.683 m)  01/11/14 5' 6.5" (1.689 m)    General appearance: alert, cooperative and appears stated age Head: Normocephalic, without obvious abnormality, atraumatic Neck: no adenopathy, supple, symmetrical, trachea midline and thyroid {EXAM; THYROID:18604} Lungs: clear to auscultation bilaterally Breasts: {Exam; breast:13139::"normal appearance, no masses or tenderness"} Heart: regular rate and rhythm Abdomen: soft, non-tender; no masses,  no organomegaly Extremities: extremities normal, atraumatic, no cyanosis or edema Skin: Skin color, texture, turgor normal. No rashes or lesions Lymph nodes: Cervical, supraclavicular, and axillary nodes normal. No abnormal inguinal nodes palpated Neurologic: Grossly normal   Pelvic: External genitalia:  no lesions  Urethra:  normal appearing urethra with no masses, tenderness or lesions              Bartholin's and Skene's: normal                 Vagina: normal appearing vagina with normal color and discharge, no lesions              Cervix: {exam; cervix:14595}              Pap taken: {yes no:314532} Bimanual Exam:  Uterus:  {exam; uterus:12215}              Adnexa: {exam; adnexa:12223}               Rectovaginal: Confirms               Anus:  normal sphincter tone, no lesions  Chaperone present: ***  A:  Well Woman with normal exam  P:   Reviewed health and wellness pertinent to exam  Pap smear: {YES NO:22349}  {plan;  gyn:5269::"mammogram","pap smear","return annually or prn"}  An After Visit Summary was printed and given to the patient.

## 2017-06-30 ENCOUNTER — Ambulatory Visit: Payer: Self-pay | Admitting: Obstetrics and Gynecology

## 2017-06-30 ENCOUNTER — Other Ambulatory Visit: Payer: Self-pay

## 2017-06-30 ENCOUNTER — Other Ambulatory Visit (HOSPITAL_COMMUNITY)
Admission: RE | Admit: 2017-06-30 | Discharge: 2017-06-30 | Disposition: A | Discharge status: Home / Self Care | Payer: 59 | Source: Ambulatory Visit | Attending: Obstetrics and Gynecology | Admitting: Obstetrics and Gynecology

## 2017-06-30 ENCOUNTER — Encounter: Payer: Self-pay | Admitting: Obstetrics and Gynecology

## 2017-06-30 VITALS — BP 148/82 | HR 80 | Resp 18 | Ht 66.0 in | Wt 244.0 lb

## 2017-06-30 DIAGNOSIS — I10 Essential (primary) hypertension: Secondary | ICD-10-CM | POA: Diagnosis not present

## 2017-06-30 DIAGNOSIS — N841 Polyp of cervix uteri: Secondary | ICD-10-CM | POA: Insufficient documentation

## 2017-06-30 DIAGNOSIS — R3915 Urgency of urination: Secondary | ICD-10-CM

## 2017-06-30 DIAGNOSIS — N644 Mastodynia: Secondary | ICD-10-CM

## 2017-06-30 DIAGNOSIS — E039 Hypothyroidism, unspecified: Secondary | ICD-10-CM | POA: Insufficient documentation

## 2017-06-30 DIAGNOSIS — R3129 Other microscopic hematuria: Secondary | ICD-10-CM | POA: Diagnosis not present

## 2017-06-30 DIAGNOSIS — Z01419 Encounter for gynecological examination (general) (routine) without abnormal findings: Secondary | ICD-10-CM | POA: Diagnosis not present

## 2017-06-30 DIAGNOSIS — Z124 Encounter for screening for malignant neoplasm of cervix: Secondary | ICD-10-CM

## 2017-06-30 DIAGNOSIS — Z9189 Other specified personal risk factors, not elsewhere classified: Secondary | ICD-10-CM

## 2017-06-30 DIAGNOSIS — Z1272 Encounter for screening for malignant neoplasm of vagina: Secondary | ICD-10-CM | POA: Diagnosis not present

## 2017-06-30 DIAGNOSIS — E7800 Pure hypercholesterolemia, unspecified: Secondary | ICD-10-CM | POA: Diagnosis not present

## 2017-06-30 DIAGNOSIS — R350 Frequency of micturition: Secondary | ICD-10-CM

## 2017-06-30 LAB — POCT URINALYSIS DIPSTICK
Bilirubin, UA: NEGATIVE
Glucose, UA: NEGATIVE
Ketones, UA: NEGATIVE
Leukocytes, UA: NEGATIVE
Nitrite, UA: NEGATIVE
Protein, UA: NEGATIVE
Urobilinogen, UA: NEGATIVE E.U./dL — AB
pH, UA: 5 (ref 5.0–8.0)

## 2017-06-30 NOTE — Progress Notes (Signed)
62 y.o. G1P1001 MarriedCaucasianF here for annual exam.  She has had pain in her right breast for the last 2-3 years, has had negative imaging. In the past. She is more uncomfortable in the last year. She has a small lump in the right breast, no change.  She drinks about 1/2 a cup of coffee a day.  She is sexually active, no pain, she c/o brownish spotting after intercourse for a year or two, she thought it was her husband and recently realized it was her. Infrequently has intercourse. Last spotting about a month ago.  For the last month or so she c/o urinary frequency and urgency. Can be small to small to normal amounts. No leakage. Up 1-2 x at night to void.    Patient's last menstrual period was 12/02/2002 (approximate).          Sexually active: Yes.    The current method of family planning is post menopausal status.    Exercising: No.  The patient does not participate in regular exercise at present. 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 she has never used smokeless tobacco. She reports that she drinks about 1.2 - 1.8 oz of alcohol per week. She reports that she does not use drugs. Retired, then went back to work. 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 Daughter is local, has a 78 year old son.   Past Medical History:  Diagnosis Date  . Amenorrhea   . DES exposure in utero   . Fibroids   . Hepatitis C 11/99   2 yr tx, "remission", told Ag neg  . Hypercholesteremia   . Hypertension   . Thyroid disease    hypothyroid    Past Surgical History:  Procedure Laterality Date  . COLPOSCOPY  10/98   normal, no biopsy  . ENDOMETRIAL ABLATION  12/02/2002  . ENDOMETRIAL BIOPSY  5/03  . THYROIDECTOMY, PARTIAL     thyroid nodule excision left side    Current  Outpatient Medications  Medication Sig Dispense Refill  . ALPRAZolam (XANAX) 0.5 MG tablet Take 1 tablet by mouth as needed.    Marland Kitchen levothyroxine (SYNTHROID, LEVOTHROID) 125 MCG tablet Take 1 tablet by mouth daily.    Marland Kitchen losartan-hydrochlorothiazide (HYZAAR) 100-12.5 MG per tablet Take 1 tablet by mouth daily.    . meloxicam (MOBIC) 7.5 MG tablet Take 1 tablet by mouth daily.    . metoprolol (TOPROL-XL) 200 MG 24 hr tablet Take 200 mg by mouth daily.    . pravastatin (PRAVACHOL) 80 MG tablet Take 1 tablet by mouth daily.    . triazolam (HALCION) 0.25 MG tablet Take 2 tablets by mouth daily.     No current facility-administered medications for this visit.     Family History  Problem Relation Age of Onset  . Cancer Mother        lung,   . Hypertension Father   . Emphysema Father   . Cancer Sister        tongue  . Cancer Maternal Aunt        colon, cervical, throat  Maternal first cousin with breast cancer, 71  Review of Systems  Constitutional: Negative.   HENT: Negative.   Eyes:  Negative.   Respiratory: Negative.   Cardiovascular: Negative.   Gastrointestinal: Negative.   Endocrine: Negative.   Genitourinary: Positive for dysuria and frequency.       Spotting after intercourse  Night urination  Right Breast pain   Musculoskeletal: Positive for joint swelling and myalgias.  Skin: Negative.   Allergic/Immunologic: Negative.   Neurological: Negative.   Psychiatric/Behavioral: Negative.     Exam:   BP (!) 148/82 (BP Location: Right Arm, Patient Position: Sitting, Cuff Size: Large)   Pulse 80   Resp 18   Ht 5\' 6"  (1.676 m)   Wt 244 lb (110.7 kg)   LMP 12/02/2002 (Approximate)   BMI 39.38 kg/m   Weight change: @WEIGHTCHANGE @ Height:   Height: 5\' 6"  (167.6 cm)  Ht Readings from Last 3 Encounters:  06/30/17 5\' 6"  (1.676 m)  03/23/16 5' 6.25" (1.683 m)  03/05/15 5' 6.25" (1.683 m)    General appearance: alert, cooperative and appears stated age Head: Normocephalic,  without obvious abnormality, atraumatic Neck: no adenopathy, supple, symmetrical, trachea midline and thyroid normal to inspection and palpation Lungs: clear to auscultation bilaterally Cardiovascular: regular rate and rhythm Breasts: normal appearance, the right breast is slightly larger than the left, tender at 2 o'clock, no masses noted Abdomen: soft, non-tender; non distended,  no masses,  no organomegaly Extremities: extremities normal, atraumatic, no cyanosis or edema Skin: Skin color, texture, turgor normal. No rashes or lesions Lymph nodes: Cervical, supraclavicular, and axillary nodes normal. No abnormal inguinal nodes palpated Neurologic: Grossly normal   Pelvic: External genitalia:  no lesions              Urethra:  normal appearing urethra with no masses, tenderness or lesions              Bartholins and Skenes: normal                 Vagina: normal appearing vagina with normal color and discharge, no lesions              Cervix: cervical polyp removed with ringed forceps               Bimanual Exam:  Uterus:  slightly enlarged RV uterus (h/o fibroids), not tender, mobile              Adnexa: no mass, fullness, tenderness               Rectovaginal: Confirms               Anus:  normal sphincter tone, no lesions  Chaperone was present for exam.  A:  Well Woman with normal exam  H/O DES exposure, needs yearly paps until 72, then q 3 years  Worsening right breast pain  Postcoital bleeding, cervical polyp removed, sent to pathology  Urinary frequency and urgency  Microscopic hematuria  P:   Pap   Declines colonoscopy, will place order for cologuard  Diagnostic breast imaging, attention right breast 2 o'clock  Discussed stopping caffeine  Discussed evening primrose oil and vit E for breast pain  Send urine for ua, c&s, if negative we discussed the option of trying medication for overactive bladder symptoms, discussed side effects of constipation and dry mouth  Cervical  polyp removed, if any further bleeding with or without intercourse, she needs to be further evaluated  Labs with primary

## 2017-06-30 NOTE — Progress Notes (Signed)
Scheduled for bilateral diagnostic mammogram with right breast ultrasound at [facility] on 07/06/2017 at 2:30 pm. Patient is agreeable to date and time. Placed in mammogram hold.

## 2017-06-30 NOTE — Patient Instructions (Addendum)
Breast Tenderness Breast tenderness is a common problem for women of all ages. Breast tenderness may cause mild discomfort to severe pain. The pain usually comes and goes in association with your menstrual cycle, but it can be constant. Breast tenderness has many possible causes, including hormone changes and some medicines. Your health care provider may order tests, such as a mammogram or an ultrasound, to check for any unusual findings. Having breast tenderness usually does not mean that you have breast cancer. Follow these instructions at home: Sometimes, reassurance that you do not have breast cancer is all that is needed. In general, follow these home care instructions: Managing pain and discomfort  If directed, apply ice to the area: ? Put ice in a plastic bag. ? Place a towel between your skin and the bag. ? Leave the ice on for 20 minutes, 2-3 times a day.  Make sure you are wearing a supportive bra, especially during exercise. You may also want to wear a supportive bra while sleeping if your breasts are very tender. Medicines  Take over-the-counter and prescription medicines only as told by your health care provider. If the cause of your pain is infection, you may be prescribed an antibiotic medicine.  If you were prescribed an antibiotic, take it as told by your health care provider. Do not stop taking the antibiotic even if you start to feel better. General instructions  Your health care provider may recommend that you reduce the amount of fat in your diet. You can do this by: ? Limiting fried foods. ? Cooking foods using methods, such as baking, boiling, grilling, and broiling.  Decrease the amount of caffeine in your diet. You can do this by drinking more water and choosing caffeine-free options.  Keep a log of the days and times when your breasts are most tender.  Ask your health care provider how to do breast exams at home. This will help you notice if you have an unusual  growth or lump. Contact a health care provider if:  Any part of your breast is hard, red, and hot to the touch. This may be a sign of infection.  You are not breastfeeding and you have fluid, especially blood or pus, coming out of your nipples.  You have a fever.  You have a new or painful lump in your breast that remains after your menstrual period ends.  Your pain does not improve or it gets worse.  Your pain is interfering with your daily activities. This information is not intended to replace advice given to you by your health care provider. Make sure you discuss any questions you have with your health care provider. Document Released: 04/01/2008 Document Revised: 01/16/2016 Document Reviewed: 01/16/2016 Elsevier Interactive Patient Education  2018 [employer]. Fibrocystic Breast Changes Fibrocystic breast changes are changes in breast tissue that can cause breasts to become swollen, lumpy, or painful. This can happen due to buildup of scar-like tissue (fibrous tissue) or the forming of fluid-filled lumps (cysts) in the breast. This is a common condition, and it is not cancerous (is benign). The exact cause is not known, but it seems to occur when women go through hormonal changes during their menstrual cycle. Fibrocystic breast changes can affect one or both breasts. What are the causes? The exact cause of fibrocystic breast changes is not known. However, this condition:  May be related to the female hormones estrogen and progesterone.  May be influenced by family traits that get passed from parent to child (  genetics).  What are the signs or symptoms? Symptoms of this condition may affect one or both breasts, and may include:  Tenderness, mild discomfort, or pain.  Swelling.  Rope-like tissue that can be felt when touching the breast.  Lumps in one or both breasts.  Changes in breast size. Breasts may get larger before the menstrual period and smaller after the menstrual  period.  Green or dark brown discharge from the nipple.  Symptoms are usually worse before menstrual periods start, and they get better toward the end of menstrual periods. How is this diagnosed? This condition is diagnosed based on your medical history and a physical exam of your breasts. You may also have tests, such as:  A breast X-ray (mammogram).  Ultrasound of your breasts.  MRI.  Removal of a breast tissue sample for testing (breast biopsy). This may be done if your health care provider thinks that something else may be causing changes in your breasts.  How is this treated? Often, treatment is not needed for this condition. In some cases, treatment may include:  Taking over-the-counter pain relievers to help lessen pain or discomfort.  Limiting or avoiding caffeine. Foods and beverages that contain caffeine include chocolate, soda, coffee, and tea.  Reducing sugar and fat in your diet.  Your health care provider may also recommend:  A procedure to remove fluid from a cyst that is causing pain (fine needle aspiration).  Surgery to remove a cyst that is large or tender or does not go away.  Follow these instructions at home:  Examine your breasts after every menstrual period. If you do not have menstrual periods, check your breasts on the first day of every month. Feel for changes in your breasts, such as: ? More tenderness. ? A new growth. ? A change in size. ? A change in an existing lump.  Take over-the-counter and prescription medicines only as told by your health care provider.  Wear a well-fitted support or sports bra, especially when exercising.  Decrease or avoid caffeine, fat, and sugar in your diet as directed by your health care provider. Contact a health care provider if:  You have fluid leaking from your nipple, especially if it is bloody.  You have new lumps or bumps in your breast.  Your breast becomes enlarged, red, and painful.  You have areas  of your breast that pucker inward.  Your nipple appears flat or indented. Get help right away if:  You have redness of your breast and the redness is spreading. Summary  Fibrocystic breast changes are changes in breast tissue that can cause breasts to become swollen, lumpy, or painful.  This condition may be related to the female hormones estrogen and progesterone.  With this condition, it is important to examine your breasts after every menstrual period. If you do not have menstrual periods, check your breasts on the first day of every month. This information is not intended to replace advice given to you by your health care provider. Make sure you discuss any questions you have with your health care provider. Document Released: 02/03/2006 Document Revised: 12/30/2015 Document Reviewed: 12/17/2015 Elsevier Interactive Patient Education  2017 [place] AND DIET:  We recommended that you start or continue a regular exercise program for good health. Regular exercise means any activity that makes your heart beat faster and makes you sweat.  We recommend exercising at least 30 minutes per day at least 3 days a week, preferably 4 or 5.  We  also recommend a diet low in fat and sugar.  Inactivity, poor dietary choices and obesity can cause diabetes, heart attack, stroke, and kidney damage, among others.    ALCOHOL AND SMOKING:  Women should limit their alcohol intake to no more than 7 drinks/beers/glasses of wine (combined, not each!) per week. Moderation of alcohol intake to this level decreases your risk of breast cancer and liver damage. And of course, no recreational drugs are part of a healthy lifestyle.  And absolutely no smoking or even second hand smoke. Most people know smoking can cause heart and lung diseases, but did you know it also contributes to weakening of your bones? Aging of your skin?  Yellowing of your teeth and nails?  CALCIUM AND VITAMIN D:  Adequate intake of  calcium and Vitamin D are recommended.  The recommendations for exact amounts of these supplements seem to change often, but generally speaking 600 mg of calcium (either carbonate or citrate) and 800 units of Vitamin D per day seems prudent. Certain women may benefit from higher intake of Vitamin D.  If you are among these women, your doctor will have told you during your visit.    PAP SMEARS:  Pap smears, to check for cervical cancer or precancers,  have traditionally been done yearly, although recent scientific advances have shown that most women can have pap smears less often.  However, every woman still should have a physical exam from her gynecologist every year. It will include a breast check, inspection of the vulva and vagina to check for abnormal growths or skin changes, a visual exam of the cervix, and then an exam to evaluate the size and shape of the uterus and ovaries.  And after 63 years of age, a rectal exam is indicated to check for rectal cancers. We will also provide age appropriate advice regarding health maintenance, like when you should have certain vaccines, screening for sexually transmitted diseases, bone density testing, colonoscopy, mammograms, etc.   MAMMOGRAMS:  All women over 47 years old should have a yearly mammogram. Many facilities now offer a "3D" mammogram, which may cost around $50 extra out of pocket. If possible,  we recommend you accept the option to have the 3D mammogram performed.  It both reduces the number of women who will be called back for extra views which then turn out to be normal, and it is better than the routine mammogram at detecting truly abnormal areas.    COLONOSCOPY:  Colonoscopy to screen for colon cancer is recommended for all women at age 42.  We know, you hate the idea of the prep.  We agree, BUT, having colon cancer and not knowing it is worse!!  Colon cancer so often starts as a polyp that can be seen and removed at colonscopy, which can quite  literally save your life!  And if your first colonoscopy is normal and you have no family history of colon cancer, most women don't have to have it again for 10 years.  Once every ten years, you can do something that may end up saving your life, right?  We will be happy to help you get it scheduled when you are ready.  Be sure to check your insurance coverage so you understand how much it will cost.  It may be covered as a preventative service at no cost, but you should check your particular policy.

## 2017-07-05 LAB — CYTOLOGY - PAP
Diagnosis: NEGATIVE
HPV: NOT DETECTED

## 2017-07-06 DIAGNOSIS — N6312 Unspecified lump in the right breast, upper inner quadrant: Secondary | ICD-10-CM | POA: Diagnosis not present

## 2017-07-08 ENCOUNTER — Telehealth: Payer: Self-pay | Admitting: Obstetrics and Gynecology

## 2017-07-08 ENCOUNTER — Encounter: Payer: Self-pay | Admitting: Obstetrics and Gynecology

## 2017-07-08 ENCOUNTER — Ambulatory Visit: Payer: 59 | Admitting: Obstetrics and Gynecology

## 2017-07-08 VITALS — BP 138/84 | HR 80 | Ht 66.0 in | Wt 244.0 lb

## 2017-07-08 DIAGNOSIS — N950 Postmenopausal bleeding: Secondary | ICD-10-CM | POA: Diagnosis not present

## 2017-07-08 NOTE — Progress Notes (Signed)
Patient scheduled while in office for [facility] on 07/12/17 at 1:30pm, consult at 2pm with Dr. Talbert Nan. Order placed. Patient request return to work note with reason for today's OV and procedure preformed, note provided. Patient verbalizes understanding and is agreeable to date and time.

## 2017-07-08 NOTE — Telephone Encounter (Signed)
Patient states she had a polyp removed last Wednesday at her annual and she is now having a lot of bleeding and passing intermittent clots. Patient is also having back cramping. Patient is in menopause and has not had a cycle in 15 years. Please advise.

## 2017-07-08 NOTE — Telephone Encounter (Signed)
Spoke with patient. Advised reviewed with Dr.Silva who would like to see her today for evaluation. Patient states she can be to the office by 12 pm. Aware she is being worked in.  Cc: Dr.Jertson  Routing to covering provider for final review. Patient agreeable to disposition. Will close encounter.

## 2017-07-08 NOTE — Patient Instructions (Signed)
Endometrial Biopsy, Care After This sheet gives you information about how to care for yourself after your procedure. Your health care provider may also give you more specific instructions. If you have problems or questions, contact your health care provider. What can I expect after the procedure? After the procedure, it is common to have:  Mild cramping.  A small amount of vaginal bleeding for a few days. This is normal.  Follow these instructions at home:  Take over-the-counter and prescription medicines only as told by your health care provider.  Do not douche, use tampons, or have sexual intercourse until your health care provider approves.  Return to your normal activities as told by your health care provider. Ask your health care provider what activities are safe for you.  Follow instructions from your health care provider about any activity restrictions, such as restrictions on strenuous exercise or heavy lifting. Contact a health care provider if:  You have heavy bleeding, or bleed for longer than 2 days after the procedure.  You have bad smelling discharge from your vagina.  You have a fever or chills.  You have a burning sensation when urinating or you have difficulty urinating.  You have severe pain in your lower abdomen. Get help right away if:  You have severe cramps in your stomach or back.  You pass large blood clots.  Your bleeding increases.  You become weak or light-headed, or you pass out. Summary  After the procedure, it is common to have mild cramping and a small amount of vaginal bleeding for a few days.  Do not douche, use tampons, or have sexual intercourse until your health care provider approves.  Return to your normal activities as told by your health care provider. Ask your health care provider what activities are safe for you. This information is not intended to replace advice given to you by your health care provider. Make sure you discuss any  questions you have with your health care provider. Document Released: 02/07/2013 Document Revised: 05/05/2016 Document Reviewed: 05/05/2016 Elsevier Interactive Patient Education  2017 [employer]   Postmenopausal Bleeding Postmenopausal bleeding is any bleeding after menopause. Menopause is when a woman's period stops. Any type of bleeding after menopause is concerning. It should be checked by your doctor. Any treatment will depend on the cause. Follow these instructions at home: Watch your condition for any changes.  Avoid the use of tampons and douches as told by your doctor.  Change your pads often.  Get regular pelvic exams and Pap tests.  Keep all appointments for tests as told by your doctor.  Contact a doctor if:  Your bleeding lasts for more than 1 week.  You have belly (abdominal) pain.  You have bleeding after sex (intercourse). Get help right away if:  You have a fever, chills, a headache, dizziness, muscle aches, and bleeding.  You have strong pain with bleeding.  You have clumps of blood (blood clots) coming from your vagina.  You have bleeding and need more than 1 pad an hour.  You feel like you are going to pass out (faint). This information is not intended to replace advice given to you by your health care provider. Make sure you discuss any questions you have with your health care provider. Document Released: 01/27/2008 Document Revised: 09/25/2015 Document Reviewed: 11/16/2012 Elsevier Interactive Patient Education  2017 [employer]  

## 2017-07-08 NOTE — Telephone Encounter (Signed)
Spoke with patient. Post menopausal patient. Patient states that she had a cervical polyp removed in office on 06/30/2017. Had light spotting after that continued through 07/05/2017. On 07/06/2017 she had a heavy gush of bleeding with clots once during the day. Woke up this morning 07/08/2017 and reports she had heavy bleeding heavier than a menses and was passing clots that are the size of two quarters together. Since being up this morning no further bleeding. Having cramping in her lower back and uterus. Patient would like to review with MD before scheduling an appointment. Advised will review and return call. Recommended OV.

## 2017-07-08 NOTE — Progress Notes (Signed)
GYNECOLOGY  VISIT   HPI: 62 y.o.   Married  Caucasian  female   G1P1001 with Patient's last menstrual period was 12/02/2002 (approximate).   here for heavy bleeding following benign cervical polyp removal on 06/30/17 during annual exam.  Bled vaginally the day of her recent mammogram and then had vaginal bleeding again last night that was pouring out. Not changing pads.  Sexual intercourse did not precede this.   Cramping and pressure since 3:30 this am.  Feels it in the lower abdomen and her lower back.  No fever.  Feels a little weak.  No dysuria.  No hx UTIs or renal stones.   Hx post coital bleeding.  Hx DES exposure.   GYNECOLOGIC HISTORY: Patient's last menstrual period was 12/02/2002 (approximate). Contraception:   Menopausal hormone therapy: none Last mammogram: 04-06-16 Right breast U/S WNL  Last pap smear:   06/30/17 WNL NEG HR HPV, 01-01-14 WNL NEG HR HPV                  OB History    Gravida Para Term Preterm AB Living   1 1 1  0 0 1   SAB TAB Ectopic Multiple Live Births   0 0 0 0 1         Patient Active Problem List   Diagnosis Date Noted  . 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 Family history of breast cancer in first degree relative 01/01/2014  . Essential hypertension 01/01/2014  . Pure hypercholesterolemia 01/01/2014  . Unspecified hypothyroidism 01/01/2014    Past Medical History:  Diagnosis Date  . Amenorrhea   . DES exposure in utero   . Fibroids   . Hepatitis C 11/99   2 yr tx, "remission", told Ag neg  . Hypercholesteremia   . Hypertension   . Thyroid disease    hypothyroid    Past Surgical History:  Procedure Laterality Date  . COLPOSCOPY  10/98   normal, no biopsy  . ENDOMETRIAL ABLATION  12/02/2002  . ENDOMETRIAL BIOPSY  5/03  . THYROIDECTOMY, PARTIAL     thyroid nodule excision left side    Current Outpatient Medications  Medication Sig Dispense Refill  . ALPRAZolam (XANAX) 0.5 MG tablet Take 1  tablet by mouth as needed.    Marland Kitchen levothyroxine (SYNTHROID, LEVOTHROID) 125 MCG tablet Take 1 tablet by mouth daily.    Marland Kitchen losartan-hydrochlorothiazide (HYZAAR) 100-12.5 MG per tablet Take 1 tablet by mouth daily.    . meloxicam (MOBIC) 7.5 MG tablet Take 1 tablet by mouth daily.    . metoprolol (TOPROL-XL) 200 MG 24 hr tablet Take 200 mg by mouth daily.    . pravastatin (PRAVACHOL) 80 MG tablet Take 1 tablet by mouth daily.    . triazolam (HALCION) 0.25 MG tablet Take 2 tablets by mouth daily.     No current facility-administered medications for this visit.      ALLERGIES: Patient has no known allergies.  Family History  Problem Relation Age of Onset  . Cancer Mother        lung,   . Hypertension Father   . Emphysema Father   . Cancer Sister        tongue  . Cancer Maternal Aunt        colon, cervical, throat    Social History   Socioeconomic History  . Marital status: Married    Spouse name: Not on file  . Number of children: 1  . Years of education:  Not on file  . Highest education level: Not on file  Social Needs  . Financial resource strain: Not on file  . Food insecurity - worry: Not on file  . Food insecurity - inability: Not on file  . Transportation needs - medical: Not on file  . Transportation needs - non-medical: Not on file  Occupational History  . Not on file  Tobacco Use  . Smoking status: Never Smoker  . Smokeless tobacco: Never Used  Substance and Sexual Activity  . Alcohol use: Yes    Alcohol/week: 1.2 - 1.8 oz    Types: 2 - 3 Standard drinks or equivalent per week  . Drug use: No  . Sexual activity: Yes    Partners: Male    Birth control/protection: Post-menopausal  Other Topics Concern  . Not on file  Social History Narrative  . Not on file    ROS:  Pertinent items are noted in HPI.  PHYSICAL EXAMINATION:    BP 138/84 (BP Location: Right Arm, Patient Position: Sitting, Cuff Size: Large)   Pulse 80 Comment: slight skip  Ht 5\' 6"  (1.676  m)   Wt 244 lb (110.7 kg)   LMP 12/02/2002 (Approximate)   BMI 39.38 kg/m     General appearance: alert, cooperative and appears stated age   Pelvic: External genitalia:  no lesions              Urethra:  normal appearing urethra with no masses, tenderness or lesions              Bartholins and Skenes: normal                 Vagina: normal appearing vagina with normal color and discharge, no lesions              Cervix: no lesions.  Very tip of polyp seen?  Brown, red blood in vagina.                 Bimanual Exam:  Uterus:  normal size, contour, position, consistency, mobility, non-tender              Adnexa: no mass, fullness, tenderness     EMB performed after consent obtained.  Hibiclens prep.  Paracervical block with 10 cc 1% lidocaine, lot [id number], exp 11/21.     Pipelle passed x 2 to 6.5 cm.  Tissue to pathology.  Minimal EBL.  Tip of polyp just inside os tx with AgNO3. No complications.   Chaperone was present for exam.  ASSESSMENT  Postmenopausal bleeding.  Recent removal of cervical polyp.  Hx DES exposure.  Normal recent pap.   PLAN  Discussion of postmenopausal bleeding.  Follow up biopsy.  Return for sonohysterogram with Dr. Talbert Nan. There may be an endometrial polyp which had prolapsed through the cervix. Procedure explained.   An After Visit Summary was printed and given to the patient.  __15____ minutes face to face time of which over 50% was spent in counseling.

## 2017-07-08 NOTE — Telephone Encounter (Signed)
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

## 2017-07-12 ENCOUNTER — Other Ambulatory Visit: Payer: Self-pay | Admitting: Obstetrics and Gynecology

## 2017-07-12 ENCOUNTER — Other Ambulatory Visit: Payer: Self-pay

## 2017-07-12 ENCOUNTER — Encounter: Payer: Self-pay | Admitting: Obstetrics and Gynecology

## 2017-07-12 ENCOUNTER — Ambulatory Visit: Payer: 59 | Admitting: Obstetrics and Gynecology

## 2017-07-12 ENCOUNTER — Ambulatory Visit (INDEPENDENT_AMBULATORY_CARE_PROVIDER_SITE_OTHER): Payer: 59

## 2017-07-12 VITALS — BP 144/86 | HR 72 | Resp 16 | Wt 245.0 lb

## 2017-07-12 DIAGNOSIS — R102 Pelvic and perineal pain: Secondary | ICD-10-CM

## 2017-07-12 DIAGNOSIS — N950 Postmenopausal bleeding: Secondary | ICD-10-CM

## 2017-07-12 DIAGNOSIS — R3915 Urgency of urination: Secondary | ICD-10-CM

## 2017-07-12 NOTE — Progress Notes (Signed)
GYNECOLOGY  VISIT   HPI: 62 y.o.   Married  Caucasian  female   G1P1001 with Patient's last menstrual period was 12/02/2002 (approximate).   here for f/u on PMP bleeding. She was seen on 2/28 for an annual exam. She was c/o postcoital spotting. An endocervical polyp was removed. She had bleeding after removal which started and stopped. She was seen on 3/8 with continued bleeding. Endometrial biopsy was benign. She is here for an ultrasound.  She had some cramping prior to her visit on Friday, had bad cramping after the endometrial biopsy and has been cramping intermittently since then.  She c/o urinary urgency for weeks, voiding small amounts in the last week. She has frequency of urination. NO pain.  GYNECOLOGIC HISTORY: Patient's last menstrual period was 12/02/2002 (approximate). Contraception:post menopausal  Menopausal hormone therapy: none        OB History    Gravida Para Term Preterm AB Living   1 1 1  0 0 1   SAB TAB Ectopic Multiple Live Births   0 0 0 0 1         Patient Active Problem List   Diagnosis Date Noted  . 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 Family history of breast cancer in first degree relative 01/01/2014  . Essential hypertension 01/01/2014  . Pure hypercholesterolemia 01/01/2014  . Unspecified hypothyroidism 01/01/2014    Past Medical History:  Diagnosis Date  . Amenorrhea   . DES exposure in utero   . Fibroids   . Hepatitis C 11/99   2 yr tx, "remission", told Ag neg  . Hypercholesteremia   . Hypertension   . Thyroid disease    hypothyroid    Past Surgical History:  Procedure Laterality Date  . COLPOSCOPY  10/98   normal, no biopsy  . ENDOMETRIAL ABLATION  12/02/2002  . ENDOMETRIAL BIOPSY  5/03  . THYROIDECTOMY, PARTIAL     thyroid nodule excision left side    Current Outpatient Medications  Medication Sig Dispense Refill  . ALPRAZolam (XANAX) 0.5 MG tablet Take 1 tablet by mouth as needed.    Marland Kitchen  levothyroxine (SYNTHROID, LEVOTHROID) 125 MCG tablet Take 1 tablet by mouth daily.    Marland Kitchen losartan-hydrochlorothiazide (HYZAAR) 100-12.5 MG per tablet Take 1 tablet by mouth daily.    . meloxicam (MOBIC) 7.5 MG tablet Take 1 tablet by mouth daily.    . metoprolol (TOPROL-XL) 200 MG 24 hr tablet Take 200 mg by mouth daily.    . pravastatin (PRAVACHOL) 80 MG tablet Take 1 tablet by mouth daily.    . triazolam (HALCION) 0.25 MG tablet Take 2 tablets by mouth daily.     No current facility-administered medications for this visit.      ALLERGIES: Patient has no known allergies.  Family History  Problem Relation Age of Onset  . Cancer Mother        lung,   . Hypertension Father   . Emphysema Father   . Cancer Sister        tongue  . Cancer Maternal Aunt        colon, cervical, throat    Social History   Socioeconomic History  . Marital status: Married    Spouse name: Not on file  . Number of children: 1  . Years of education: Not on file  . Highest education level: Not on file  Social Needs  . Financial resource strain: Not on file  . Food insecurity - worry: Not  on file  . Food insecurity - inability: Not on file  . Transportation needs - medical: Not on file  . Transportation needs - non-medical: Not on file  Occupational History  . Not on file  Tobacco Use  . Smoking status: Never Smoker  . Smokeless tobacco: Never Used  Substance and Sexual Activity  . Alcohol use: Yes    Alcohol/week: 1.2 - 1.8 oz    Types: 2 - 3 Standard drinks or equivalent per week  . Drug use: No  . Sexual activity: Yes    Partners: Male    Birth control/protection: Post-menopausal  Other Topics Concern  . Not on file  Social History Narrative  . Not on file    Review of Systems  Constitutional: Negative.   HENT: Negative.   Eyes: Negative.   Respiratory: Negative.   Cardiovascular: Negative.   Genitourinary: Positive for frequency and urgency.       Night urination  Pain with  intercourse  Musculoskeletal: Positive for myalgias.  Skin: Negative.   Neurological: Negative.   Endo/Heme/Allergies: Negative.   Psychiatric/Behavioral: Negative.     PHYSICAL EXAMINATION:    BP (!) 144/86 (BP Location: Right Arm, Patient Position: Sitting, Cuff Size: Large)   Pulse 72   Resp 16   Wt 245 lb (111.1 kg)   LMP 12/02/2002 (Approximate)   BMI 39.54 kg/m     General appearance: alert, cooperative and appears stated age   Pelvic: External genitalia:  no lesions              Urethra:  normal appearing urethra with no masses, tenderness or lesions              Bartholins and Skenes: normal                 Vagina: normal appearing vagina with normal color and discharge, no lesions              Cervix: no cervical motion tenderness, no lesions and not friable              Bimanual Exam:  Uterus:  retroverted, irregular, mildly tender               Adnexa: no mass, fullness, tenderness               Chaperone was present for exam.  ASSESSMENT Postmenopausal bleeding, endocervical polyp removed, benign, endometrial biopsy benign. Stripe appears thin Cramping since endometrial biopsy on Friday, mildly tender on exam. No fevers. Cramping is improving some Urinary urgency, frequency, OAB. Altering her life   PLAN If she has further bleeding, will need further evaluation If her pain doesn't improve will reevaluate, if still tender would treat for possible endometritis.  Send urine for ua, c&s, if negative will start medication   An After Visit Summary was printed and given to the patient.  ~20 minutes face to face time of which over 50% was spent in counseling.

## 2017-07-13 LAB — URINALYSIS, MICROSCOPIC ONLY: Casts: NONE SEEN /lpf

## 2017-07-13 LAB — URINE CULTURE

## 2017-07-15 ENCOUNTER — Other Ambulatory Visit: Payer: Self-pay | Admitting: *Deleted

## 2017-07-15 MED ORDER — TOLTERODINE TARTRATE ER 2 MG PO CP24: 2.0000 mg | ORAL_CAPSULE | Freq: Every day | ORAL | 0 refills | Status: DC

## 2017-07-18 ENCOUNTER — Telehealth: Payer: Self-pay | Admitting: Obstetrics and Gynecology

## 2017-07-18 MED ORDER — OXYBUTYNIN CHLORIDE ER 5 MG PO TB24: ORAL_TABLET | ORAL | 0 refills | Status: DC

## 2017-07-18 NOTE — Telephone Encounter (Signed)
Spoke with patient, advised PA required for Detrol. Advised can submit via [email or web address], can take up to 72 hrs for response.   Patient request alternative covered medication, does not want to wait for PA. Advised patient to contact insurance provider for list of covered alternatives, can then review options with provider. Patient request to submit PA for Detrol. Advised will submit PA and return call with response. Patient agreeable.

## 2017-07-18 NOTE — Telephone Encounter (Signed)
Left message to call Geeta Dworkin at [phone].  

## 2017-07-18 NOTE — Telephone Encounter (Signed)
Dr. Talbert Nan, Detrol not covered by patients plan, please advise on Oxybutynin as alternative?

## 2017-07-18 NOTE — Telephone Encounter (Signed)
Spoke with patient, advised as seen below per Dr. Talbert Nan. Patient is scheduled for f/u on 08/09/17.   Routing to provider for final review. Patient is agreeable to disposition. Will close encounter.

## 2017-07-18 NOTE — Telephone Encounter (Signed)
Script for Oxybutynin sent. She can increase to 2 tablets a day after a week as tolerated and needed. Make sure she has a f/u appointment within a month to f/u. I can increase the medication more if needed at that appointment.

## 2017-07-18 NOTE — Telephone Encounter (Signed)
PA submitted for Detrol LA 2mg  cap (Tolterodine) via phone. Fax received, medication is a plan exclusion, patient can contact Member Services on back of ID card to review benefits.

## 2017-07-18 NOTE — Telephone Encounter (Signed)
Spoke with Lenna Sciara at [facility], was advised Rx for Detrol received 3/15, requires PA, will fax request to office.

## 2017-07-18 NOTE — Telephone Encounter (Signed)
Pharmacy did not receive Detrol prescription. Can be reached at work.

## 2017-07-18 NOTE — Telephone Encounter (Signed)
Patient returning [facility] call. Generic covered medication is Oxybutynin.

## 2017-08-05 ENCOUNTER — Telehealth: Payer: Self-pay | Admitting: Obstetrics and Gynecology

## 2017-08-05 NOTE — Telephone Encounter (Signed)
Patient is scheduled for medication recheck on the 9th and she want to know if she should keep this appointment because she has one scheduled with her primary doctor.

## 2017-08-05 NOTE — Telephone Encounter (Signed)
Patient states she's doing well with Detrol, no more vaginal bleeding. She has appointment with PCP to check urine and refill her other medications and states he can refill her Detrol also. She can't afford to see Dr.Jertson (specialist) and PCP right now. She's already spoken with PCP office and they state they will recheck urine for her and refill her Detrol. She wants to cancel her f/u with Dr.Jertson on 4/9. Patient thanks her for all her help and will call back if any further vaginal bleeding. Cancelled 08/09/17 follow up. Routed to Dr. Talbert Nan

## 2017-08-08 DIAGNOSIS — R319 Hematuria, unspecified: Secondary | ICD-10-CM | POA: Diagnosis not present

## 2017-08-08 DIAGNOSIS — I10 Essential (primary) hypertension: Secondary | ICD-10-CM | POA: Diagnosis not present

## 2017-08-08 DIAGNOSIS — G4733 Obstructive sleep apnea (adult) (pediatric): Secondary | ICD-10-CM | POA: Diagnosis not present

## 2017-08-08 DIAGNOSIS — E785 Hyperlipidemia, unspecified: Secondary | ICD-10-CM | POA: Diagnosis not present

## 2017-08-09 ENCOUNTER — Ambulatory Visit: Payer: Self-pay | Admitting: Obstetrics and Gynecology

## 2017-08-23 ENCOUNTER — Encounter: Payer: Self-pay | Admitting: Obstetrics and Gynecology

## 2017-09-13 ENCOUNTER — Telehealth: Payer: Self-pay | Admitting: *Deleted

## 2017-09-13 NOTE — Telephone Encounter (Signed)
Spoke with patient in regards to cologuard. Patient states that she has recently switched to [employer] and is waiting for a "clear answer" as to what her insurance will cover before she proceeds with cologuard testing. RN advised patient to return call to office if any additional questions or concerns. Patient agreeable.

## 2017-09-14 DIAGNOSIS — G4733 Obstructive sleep apnea (adult) (pediatric): Secondary | ICD-10-CM | POA: Diagnosis not present

## 2017-09-14 DIAGNOSIS — J4520 Mild intermittent asthma, uncomplicated: Secondary | ICD-10-CM | POA: Diagnosis not present

## 2017-09-14 DIAGNOSIS — R5383 Other fatigue: Secondary | ICD-10-CM | POA: Diagnosis not present

## 2017-10-21 DIAGNOSIS — L821 Other seborrheic keratosis: Secondary | ICD-10-CM | POA: Diagnosis not present

## 2017-10-21 DIAGNOSIS — L820 Inflamed seborrheic keratosis: Secondary | ICD-10-CM | POA: Diagnosis not present

## 2017-10-21 DIAGNOSIS — B079 Viral wart, unspecified: Secondary | ICD-10-CM | POA: Diagnosis not present

## 2017-11-19 DIAGNOSIS — G4733 Obstructive sleep apnea (adult) (pediatric): Secondary | ICD-10-CM | POA: Diagnosis not present

## 2017-11-23 DIAGNOSIS — G4733 Obstructive sleep apnea (adult) (pediatric): Secondary | ICD-10-CM | POA: Diagnosis not present

## 2017-11-23 DIAGNOSIS — R5383 Other fatigue: Secondary | ICD-10-CM | POA: Diagnosis not present

## 2017-11-23 DIAGNOSIS — J4520 Mild intermittent asthma, uncomplicated: Secondary | ICD-10-CM | POA: Diagnosis not present

## 2017-12-15 ENCOUNTER — Encounter: Payer: Self-pay | Admitting: Obstetrics and Gynecology

## 2017-12-21 DIAGNOSIS — F419 Anxiety disorder, unspecified: Secondary | ICD-10-CM

## 2017-12-21 DIAGNOSIS — E785 Hyperlipidemia, unspecified: Secondary | ICD-10-CM | POA: Diagnosis not present

## 2017-12-21 DIAGNOSIS — R079 Chest pain, unspecified: Secondary | ICD-10-CM

## 2017-12-21 DIAGNOSIS — E039 Hypothyroidism, unspecified: Secondary | ICD-10-CM | POA: Diagnosis not present

## 2017-12-21 DIAGNOSIS — R072 Precordial pain: Secondary | ICD-10-CM | POA: Diagnosis not present

## 2017-12-21 DIAGNOSIS — I4891 Unspecified atrial fibrillation: Secondary | ICD-10-CM | POA: Diagnosis not present

## 2017-12-21 DIAGNOSIS — I160 Hypertensive urgency: Secondary | ICD-10-CM | POA: Diagnosis not present

## 2017-12-22 DIAGNOSIS — I160 Hypertensive urgency: Secondary | ICD-10-CM | POA: Diagnosis not present

## 2017-12-22 DIAGNOSIS — I4891 Unspecified atrial fibrillation: Secondary | ICD-10-CM | POA: Diagnosis not present

## 2017-12-22 DIAGNOSIS — R079 Chest pain, unspecified: Secondary | ICD-10-CM | POA: Diagnosis not present

## 2018-01-05 DIAGNOSIS — E785 Hyperlipidemia, unspecified: Secondary | ICD-10-CM | POA: Diagnosis not present

## 2018-01-05 DIAGNOSIS — M2550 Pain in unspecified joint: Secondary | ICD-10-CM | POA: Diagnosis not present

## 2018-01-05 DIAGNOSIS — I10 Essential (primary) hypertension: Secondary | ICD-10-CM | POA: Diagnosis not present

## 2018-01-23 DIAGNOSIS — I482 Chronic atrial fibrillation: Secondary | ICD-10-CM | POA: Diagnosis not present

## 2018-07-17 ENCOUNTER — Telehealth: Payer: Self-pay | Admitting: Obstetrics and Gynecology

## 2018-07-17 NOTE — Telephone Encounter (Signed)
Please call the patient and encourage her to do the cologuard. If she is agreeable please place another order (last one expired). Check if she has the kit. If she won't do the cologuard and she still declines colonoscopy, please send her an IFOB.

## 2018-07-18 NOTE — Telephone Encounter (Signed)
Spoke with patient, advised as seen below per Dr. Talbert Nan. Patient declines cologuard, IFob and colonoscopy at this time. Patient states she has a new insurance plan, would like to check and see what is covered. Patient states there is too much going on right now, will return call at later date to notify how she would like to proceed. Patient verbalizes understanding.  Routing to provider for final review. Patient is agreeable to disposition. Will close encounter.

## 2018-07-20 ENCOUNTER — Ambulatory Visit: Payer: 59 | Admitting: Obstetrics and Gynecology

## 2018-08-08 DIAGNOSIS — G4700 Insomnia, unspecified: Secondary | ICD-10-CM | POA: Diagnosis not present

## 2018-08-08 DIAGNOSIS — I10 Essential (primary) hypertension: Secondary | ICD-10-CM | POA: Diagnosis not present

## 2018-08-08 DIAGNOSIS — E785 Hyperlipidemia, unspecified: Secondary | ICD-10-CM | POA: Diagnosis not present

## 2018-08-31 DIAGNOSIS — I480 Paroxysmal atrial fibrillation: Secondary | ICD-10-CM | POA: Diagnosis not present

## 2019-02-27 DIAGNOSIS — E782 Mixed hyperlipidemia: Secondary | ICD-10-CM | POA: Insufficient documentation

## 2019-02-27 DIAGNOSIS — I480 Paroxysmal atrial fibrillation: Secondary | ICD-10-CM | POA: Insufficient documentation

## 2019-02-27 HISTORY — DX: Mixed hyperlipidemia: E78.2

## 2019-02-27 HISTORY — DX: Paroxysmal atrial fibrillation: I48.0

## 2019-06-06 NOTE — Progress Notes (Signed)
64 y.o. G35P1001 Married White or Caucasian Not Hispanic or Latino female here for annual exam.  No vaginal bleeding, occasionally sexually active, no pain or bleeding.  No bowel or bladder c/o.     Patient's last menstrual period was 12/02/2002 (approximate).          Sexually active: Yes.    The current method of family planning is status post hysterectomy.    Exercising: No.  The patient does not participate in regular exercise at present. Smoker:  no  Health Maintenance: Pap: 06/30/16  Normal HR HPV neg 03-23-16 WNL 01-01-14 WNL NEG HR HPV       History of abnormal Pap:  no MMG: 07/06/17 Right breast US Bi-rads 1 neg  Colonoscopy: none TDaP:  03/04/15 Gardasil: NA DEXA: no   reports that she has never smoked. She has never used smokeless tobacco. She reports current alcohol use of about 2.0 - 3.0 standard drinks of alcohol per week. She reports that she does not use drugs. 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 Daughter is local, has a 68 year old son  Past Medical History:  Diagnosis Date  . Atrial fibrillation ([place])   . DES exposure in utero   . Fibroids   . Hepatitis C 11/99   2 yr tx, "remission", told Ag neg  . History of diabetes mellitus   . Hypercholesteremia   . Hypertension   . Thyroid disease    hypothyroid    Past Surgical History:  Procedure Laterality Date  . COLPOSCOPY  10/98   normal, no biopsy  . ENDOMETRIAL ABLATION  12/02/2002  . ENDOMETRIAL BIOPSY  5/03  . THYROIDECTOMY, PARTIAL     thyroid nodule excision left side    Current Outpatient Medications  Medication Sig Dispense Refill  . ALPRAZolam (XANAX) 0.5 MG tablet Take 1 tablet by mouth as needed.    Marland Kitchen apixaban (ELIQUIS) 5 MG TABS tablet Take by mouth.    . levothyroxine (SYNTHROID, LEVOTHROID) 125 MCG tablet Take 1 tablet by mouth daily.    Marland Kitchen losartan-hydrochlorothiazide (HYZAAR) 100-12.5 MG per tablet Take 1 tablet by mouth daily.    . meloxicam (MOBIC) 7.5 MG tablet Take 1 tablet by mouth  daily.    . metoprolol (TOPROL-XL) 200 MG 24 hr tablet Take 200 mg by mouth daily.    . pravastatin (PRAVACHOL) 80 MG tablet Take 1 tablet by mouth daily.    . triazolam (HALCION) 0.25 MG tablet Take 2 tablets by mouth daily.    . metFORMIN (GLUCOPHAGE) 500 MG tablet     . TRULICITY 1.5 0000000 SOPN      No current facility-administered medications for this visit.    Family History  Problem Relation Age of Onset  . Cancer Mother        lung,   . Hypertension Father   . Emphysema Father   . Cancer Sister        tongue  . Cancer Maternal Aunt        colon, cervical, throat    Review of Systems  All other systems reviewed and are negative.   Exam:   BP (!) 160/94   Pulse 79   Temp (!) 97.3 F (36.3 C)   Ht 5\' 6"  (1.676 m)   Wt 232 lb (105.2 kg)   LMP 12/02/2002 (Approximate)   SpO2 95%   BMI 37.45 kg/m   Weight change: @WEIGHTCHANGE @ Height:   Height: 5\' 6"  (167.6 cm)  Ht Readings from  Last 3 Encounters:  06/11/19 5\' 6"  (1.676 m)  07/08/17 5\' 6"  (1.676 m)  06/30/17 5\' 6"  (1.676 m)    General appearance: alert, cooperative and appears stated age Head: Normocephalic, without obvious abnormality, atraumatic Neck: no adenopathy, supple, symmetrical, trachea midline and thyroid normal to inspection and palpation Lungs: clear to auscultation bilaterally Cardiovascular: regular rate and rhythm Breasts: normal appearance, no masses or tenderness Abdomen: soft, non-tender; non distended,  no masses,  no organomegaly Extremities: extremities normal, atraumatic, no cyanosis or edema Skin: Skin color, texture, turgor normal. No rashes or lesions Lymph nodes: Cervical, supraclavicular, and axillary nodes normal. No abnormal inguinal nodes palpated Neurologic: Grossly normal   Pelvic: External genitalia:  no lesions              Urethra:  normal appearing urethra with no masses, tenderness or lesions              Bartholins and Skenes: normal                 Vagina: normal  appearing vagina with normal color and discharge, no lesions              Cervix: no lesions               Bimanual Exam:  Uterus:  normal size, contour, position, consistency, mobility, non-tender              Adnexa: no mass, fullness, tenderness               Rectovaginal: Confirms               Anus:  normal sphincter tone, no lesions  Gae Dry chaperoned for the exam.  A:  Well Woman with normal exam  H/O DES exposure  Colon cancer screening, declines colonoscopy  P:   Pap from cervix and vagina, yearly until 62, then q 3 years.   Discussed breast self exam  Discussed calcium and vit D intake  Labs with primary  IFOB given  Mammogram overdue, she will schedule

## 2019-06-08 ENCOUNTER — Other Ambulatory Visit: Payer: Self-pay

## 2019-06-11 ENCOUNTER — Other Ambulatory Visit (HOSPITAL_COMMUNITY)
Admission: RE | Admit: 2019-06-11 | Discharge: 2019-06-11 | Disposition: A | Discharge status: Home / Self Care | Payer: 59 | Source: Ambulatory Visit | Attending: Obstetrics and Gynecology | Admitting: Obstetrics and Gynecology

## 2019-06-11 ENCOUNTER — Ambulatory Visit: Payer: 59 | Admitting: Obstetrics and Gynecology

## 2019-06-11 ENCOUNTER — Encounter: Payer: Self-pay | Admitting: Obstetrics and Gynecology

## 2019-06-11 ENCOUNTER — Other Ambulatory Visit: Payer: Self-pay

## 2019-06-11 VITALS — BP 160/94 | HR 79 | Temp 97.3°F | Ht 66.0 in | Wt 232.0 lb

## 2019-06-11 DIAGNOSIS — Z1272 Encounter for screening for malignant neoplasm of vagina: Secondary | ICD-10-CM

## 2019-06-11 DIAGNOSIS — Z124 Encounter for screening for malignant neoplasm of cervix: Secondary | ICD-10-CM | POA: Insufficient documentation

## 2019-06-11 DIAGNOSIS — Z01419 Encounter for gynecological examination (general) (routine) without abnormal findings: Secondary | ICD-10-CM | POA: Diagnosis not present

## 2019-06-11 DIAGNOSIS — I4891 Unspecified atrial fibrillation: Secondary | ICD-10-CM | POA: Insufficient documentation

## 2019-06-11 DIAGNOSIS — Z8639 Personal history of other endocrine, nutritional and metabolic disease: Secondary | ICD-10-CM | POA: Insufficient documentation

## 2019-06-11 DIAGNOSIS — Z9189 Other specified personal risk factors, not elsewhere classified: Secondary | ICD-10-CM | POA: Diagnosis present

## 2019-06-11 DIAGNOSIS — Z1211 Encounter for screening for malignant neoplasm of colon: Secondary | ICD-10-CM

## 2019-06-11 NOTE — Patient Instructions (Signed)
EXERCISE AND DIET:  We recommended that you start or continue a regular exercise program for good health. Regular exercise means any activity that makes your heart beat faster and makes you sweat.  We recommend exercising at least 30 minutes per day at least 3 days a week, preferably 4 or 5.  We also recommend a diet low in fat and sugar.  Inactivity, poor dietary choices and obesity can cause diabetes, heart attack, stroke, and kidney damage, among others.    ALCOHOL AND SMOKING:  Women should limit their alcohol intake to no more than 7 drinks/beers/glasses of wine (combined, not each!) per week. Moderation of alcohol intake to this level decreases your risk of breast cancer and liver damage. And of course, no recreational drugs are part of a healthy lifestyle.  And absolutely no smoking or even second hand smoke. Most people know smoking can cause heart and lung diseases, but did you know it also contributes to weakening of your bones? Aging of your skin?  Yellowing of your teeth and nails?  CALCIUM AND VITAMIN D:  Adequate intake of calcium and Vitamin D are recommended.  The recommendations for exact amounts of these supplements seem to change often, but generally speaking 1,200 mg of calcium (between diet and supplement) and 800 units of Vitamin D per day seems prudent. Certain women may benefit from higher intake of Vitamin D.  If you are among these women, your doctor will have told you during your visit.    PAP SMEARS:  Pap smears, to check for cervical cancer or precancers,  have traditionally been done yearly, although recent scientific advances have shown that most women can have pap smears less often.  However, every woman still should have a physical exam from her gynecologist every year. It will include a breast check, inspection of the vulva and vagina to check for abnormal growths or skin changes, a visual exam of the cervix, and then an exam to evaluate the size and shape of the uterus and  ovaries.  And after 64 years of age, a rectal exam is indicated to check for rectal cancers. We will also provide age appropriate advice regarding health maintenance, like when you should have certain vaccines, screening for sexually transmitted diseases, bone density testing, colonoscopy, mammograms, etc.   MAMMOGRAMS:  All women over 64 years old should have a yearly mammogram. Many facilities now offer a "3D" mammogram, which may cost around $50 extra out of pocket. If possible,  we recommend you accept the option to have the 3D mammogram performed.  It both reduces the number of women who will be called back for extra views which then turn out to be normal, and it is better than the routine mammogram at detecting truly abnormal areas.    COLON CANCER SCREENING: Now recommend starting at age 45. At this time colonoscopy is not covered for routine screening until 50. There are take home tests that can be done between 45-49.   COLONOSCOPY:  Colonoscopy to screen for colon cancer is recommended for all women at age 50.  We know, you hate the idea of the prep.  We agree, BUT, having colon cancer and not knowing it is worse!!  Colon cancer so often starts as a polyp that can be seen and removed at colonscopy, which can quite literally save your life!  And if your first colonoscopy is normal and you have no family history of colon cancer, most women don't have to have it again for   10 years.  Once every ten years, you can do something that may end up saving your life, right?  We will be happy to help you get it scheduled when you are ready.  Be sure to check your insurance coverage so you understand how much it will cost.  It may be covered as a preventative service at no cost, but you should check your particular policy.      Breast Self-Awareness Breast self-awareness means being familiar with how your breasts look and feel. It involves checking your breasts regularly and reporting any changes to your  health care provider. Practicing breast self-awareness is important. A change in your breasts can be a sign of a serious medical problem. Being familiar with how your breasts look and feel allows you to find any problems early, when treatment is more likely to be successful. All women should practice breast self-awareness, including women who have had breast implants. How to do a breast self-exam One way to learn what is normal for your breasts and whether your breasts are changing is to do a breast self-exam. To do a breast self-exam: Look for Changes  1. Remove all the clothing above your waist. 2. Stand in front of a mirror in a room with good lighting. 3. Put your hands on your hips. 4. Push your hands firmly downward. 5. Compare your breasts in the mirror. Look for differences between them (asymmetry), such as: ? Differences in shape. ? Differences in size. ? Puckers, dips, and bumps in one breast and not the other. 6. Look at each breast for changes in your skin, such as: ? Redness. ? Scaly areas. 7. Look for changes in your nipples, such as: ? Discharge. ? Bleeding. ? Dimpling. ? Redness. ? A change in position. Feel for Changes Carefully feel your breasts for lumps and changes. It is best to do this while lying on your back on the floor and again while sitting or standing in the shower or tub with soapy water on your skin. Feel each breast in the following way:  Place the arm on the side of the breast you are examining above your head.  Feel your breast with the other hand.  Start in the nipple area and make  inch (2 cm) overlapping circles to feel your breast. Use the pads of your three middle fingers to do this. Apply light pressure, then medium pressure, then firm pressure. The light pressure will allow you to feel the tissue closest to the skin. The medium pressure will allow you to feel the tissue that is a little deeper. The firm pressure will allow you to feel the tissue  close to the ribs.  Continue the overlapping circles, moving downward over the breast until you feel your ribs below your breast.  Move one finger-width toward the center of the body. Continue to use the  inch (2 cm) overlapping circles to feel your breast as you move slowly up toward your collarbone.  Continue the up and down exam using all three pressures until you reach your armpit.  Write Down What You Find  Write down what is normal for each breast and any changes that you find. Keep a written record with breast changes or normal findings for each breast. By writing this information down, you do not need to depend only on memory for size, tenderness, or location. Write down where you are in your menstrual cycle, if you are still menstruating. If you are having trouble noticing differences   in your breasts, do not get discouraged. With time you will become more familiar with the variations in your breasts and more comfortable with the exam. How often should I examine my breasts? Examine your breasts every month. If you are breastfeeding, the best time to examine your breasts is after a feeding or after using a breast pump. If you menstruate, the best time to examine your breasts is 5-7 days after your period is over. During your period, your breasts are lumpier, and it may be more difficult to notice changes. When should I see my health care provider? See your health care provider if you notice:  A change in shape or size of your breasts or nipples.  A change in the skin of your breast or nipples, such as a reddened or scaly area.  Unusual discharge from your nipples.  A lump or thick area that was not there before.  Pain in your breasts.  Anything that concerns you.  

## 2019-06-15 LAB — CYTOLOGY - PAP
Adequacy: ABSENT
Comment: NEGATIVE
Diagnosis: NEGATIVE
Diagnosis: REACTIVE
High risk HPV: NEGATIVE

## 2019-06-19 DIAGNOSIS — Z7901 Long term (current) use of anticoagulants: Secondary | ICD-10-CM | POA: Insufficient documentation

## 2019-06-19 DIAGNOSIS — Z9989 Dependence on other enabling machines and devices: Secondary | ICD-10-CM | POA: Insufficient documentation

## 2019-06-19 DIAGNOSIS — G4733 Obstructive sleep apnea (adult) (pediatric): Secondary | ICD-10-CM

## 2019-06-19 HISTORY — DX: Obstructive sleep apnea (adult) (pediatric): G47.33

## 2019-07-24 ENCOUNTER — Encounter: Payer: Self-pay | Admitting: Certified Nurse Midwife

## 2019-12-29 DIAGNOSIS — E669 Obesity, unspecified: Secondary | ICD-10-CM

## 2019-12-29 DIAGNOSIS — N2889 Other specified disorders of kidney and ureter: Secondary | ICD-10-CM | POA: Insufficient documentation

## 2019-12-29 HISTORY — DX: Other specified disorders of kidney and ureter: N28.89

## 2019-12-29 HISTORY — DX: Obesity, unspecified: E66.9

## 2020-03-12 ENCOUNTER — Telehealth: Payer: Self-pay | Admitting: Oncology

## 2020-03-12 NOTE — Telephone Encounter (Signed)
Scheduled appointment per 11/10 los. Spoke to patient who is aware of appointment date and time.

## 2020-03-12 NOTE — Telephone Encounter (Signed)
Scheduled appointments per 11/9 new patient referral. Spoke to patient who is aware of appointment date and time.

## 2020-04-01 ENCOUNTER — Other Ambulatory Visit: Payer: Self-pay | Admitting: Oncology

## 2020-04-01 NOTE — Progress Notes (Signed)
[facility]  [address] [place],  [place]  [address] [phone]  Clinic Day:  04/02/2020  Referring physician: Dr Cher Nakai  HISTORY OF PRESENT ILLNESS:  The patient is a 64 y.o. female  who I was asked to consult upon for possible hemochromatosis.  Recent labs showed an elevated ferritin of 377, an elevated serum iron of 143, and an iron saturation of 49%.  The patient claims her sister has hemochromatosis and undergoes routine phlebotomies.  The patient recently had elevated liver enzymes, which led to multiple studies, including iron parameters, being run to determine the etiology behind her transaminitis.  The patient also complains of joint aches, fatigue, and foggy thinking.    PAST MEDICAL HISTORY:   Past Medical History:  Diagnosis Date  . Atrial fibrillation ([place])   . DES exposure in utero   . Fibroids   . Hepatitis C 11/99   2 yr tx, "remission", told Ag neg  . History of diabetes mellitus   . Hypercholesteremia   . Hypertension   . Thyroid disease    hypothyroid    PAST SURGICAL HISTORY:   Past Surgical History:  Procedure Laterality Date  . COLPOSCOPY  10/98   normal, no biopsy  . ENDOMETRIAL ABLATION  12/02/2002  . ENDOMETRIAL BIOPSY  5/03  . THYROIDECTOMY, PARTIAL     thyroid nodule excision left side    CURRENT MEDICATIONS:   Current Outpatient Medications  Medication Sig Dispense Refill  . ALPRAZolam (XANAX) 0.5 MG tablet Take 1 tablet by mouth as needed.    Marland Kitchen apixaban (ELIQUIS) 5 MG TABS tablet Take by mouth.    . levothyroxine (SYNTHROID, LEVOTHROID) 125 MCG tablet Take 1 tablet by mouth daily.    Marland Kitchen losartan-hydrochlorothiazide (HYZAAR) 100-12.5 MG per tablet Take 1 tablet by mouth daily.    . meloxicam (MOBIC) 7.5 MG tablet Take 1 tablet by mouth daily.    . metFORMIN (GLUCOPHAGE) 500 MG tablet     . metoprolol (TOPROL-XL) 200 MG 24 hr tablet Take 200 mg by mouth daily.    . pravastatin (PRAVACHOL) 80 MG tablet  Take 1 tablet by mouth daily.    . triazolam (HALCION) 0.25 MG tablet Take 2 tablets by mouth daily.    . TRULICITY 1.5 GU/5.4YH SOPN      No current facility-administered medications for this visit.    ALLERGIES:  No Known Allergies  FAMILY HISTORY:   Family History  Problem Relation Age of Onset  . Cancer Mother        lung,   . Hypertension Father   . Emphysema Father   . Cancer Sister        tongue  . Cancer Maternal Aunt        colon, cervical, throat    SOCIAL HISTORY:  She was born and raised in [place].  She lives [place] town.  She is divorced, with 1 child and 1 grandchild.  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  She smoked up to a pack and a half of cigarettes daily x 35 years, but quit 1 years ago.  She denies a history of alcohol abuse.    REVIEW OF SYSTEMS:  Review of Systems  Constitutional: Positive for fatigue. Negative for fever.  HENT:   Negative for hearing loss and sore throat.   Eyes: Negative for eye problems.  Respiratory: Positive for shortness of breath. Negative for chest tightness, cough and hemoptysis.   Cardiovascular:  Positive for palpitations. Negative for chest pain.  Gastrointestinal: Positive for constipation and diarrhea. Negative for abdominal distention, abdominal pain, blood in stool, nausea and vomiting.  Endocrine: Negative for hot flashes.  Genitourinary: Negative for difficulty urinating, dysuria, frequency, hematuria and nocturia.   Musculoskeletal: Positive for arthralgias. Negative for back pain, gait problem and myalgias.  Skin: Negative.  Negative for itching and rash.  Neurological: Negative.  Negative for dizziness, extremity weakness, gait problem, headaches, light-headedness and numbness.  Hematological: Negative.   Psychiatric/Behavioral: Negative.  Negative for depression and suicidal ideas. The patient is not nervous/anxious.      PHYSICAL EXAM:  Blood pressure (!) 156/96, pulse (!) 55, temperature  98.3 F (36.8 C), resp. rate 14, height 5\' 7"  (1.702 m), weight 243 lb 1.6 oz (110.3 kg), last menstrual period 12/02/2002, SpO2 96 %. Wt Readings from Last 3 Encounters:  04/02/20 243 lb 1.6 oz (110.3 kg)  06/11/19 232 lb (105.2 kg)  07/12/17 245 lb (111.1 kg)   Body mass index is 38.07 kg/m. Performance status (ECOG): 1 Physical Exam Constitutional:      Appearance: Normal appearance. She is not ill-appearing.  HENT:     Mouth/Throat:     Mouth: Mucous membranes are moist.     Pharynx: Oropharynx is clear. No oropharyngeal exudate or posterior oropharyngeal erythema.  Cardiovascular:     Rate and Rhythm: Normal rate and regular rhythm.     Heart sounds: No murmur heard.  No friction rub. No gallop.   Pulmonary:     Effort: Pulmonary effort is normal. No respiratory distress.     Breath sounds: Normal breath sounds. No wheezing, rhonchi or rales.  Abdominal:     General: Bowel sounds are normal. There is no distension.     Palpations: Abdomen is soft. There is no mass.     Tenderness: There is no abdominal tenderness.  Musculoskeletal:        General: No swelling.     Right lower leg: No edema.     Left lower leg: No edema.  Lymphadenopathy:     Cervical: No cervical adenopathy.     Upper Body:     Right upper body: No supraclavicular or axillary adenopathy.     Left upper body: No supraclavicular or axillary adenopathy.     Lower Body: No right inguinal adenopathy. No left inguinal adenopathy.  Skin:    General: Skin is warm.     Coloration: Skin is not jaundiced.     Findings: No lesion or rash.  Neurological:     General: No focal deficit present.     Mental Status: She is alert and oriented to person, place, and time. Mental status is at baseline.     Cranial Nerves: Cranial nerves are intact.  Psychiatric:        Mood and Affect: Mood normal.        Behavior: Behavior normal.        Thought Content: Thought content normal.    LABS:   CBC Latest Ref Rng &  Units 04/02/2020  WBC - 6.9  Hemoglobin 12.0 - 16.0 15.3  Hematocrit 36 - 46 45  Platelets 150 - 399 233    Ref. Range 04/02/2020 11:05  Iron Latest Ref Range: 28 - 170 ug/dL 109  UIBC Latest Units: ug/dL 255  TIBC Latest Ref Range: 250 - 450 ug/dL 364  Saturation Ratios Latest Ref Range: 10.4 - 31.8 % 30  Ferritin Latest Ref Range: 11 - 307 ng/mL  [place]:  A 64 y.o. female who I was asked to consult upon for the possibility of hemochromatosis.  Although her iron parameters today are not particularly high, I will do hemochromatosis testing to ensure she does not have this iron overload disorder.  I will see her back in 1 week to go over her labs collected today.  If she is fully affected, particularly with 2 C282Y genes, I would phlebotomize her to get her ferritin below 50.  The patient understands all the plans discussed today and is in agreement with them.  I do appreciate Dr Cher Nakai for his new consult.   Shima Compere Macarthur Critchley, MD

## 2020-04-02 ENCOUNTER — Encounter: Payer: Self-pay | Admitting: Oncology

## 2020-04-02 ENCOUNTER — Inpatient Hospital Stay (INDEPENDENT_AMBULATORY_CARE_PROVIDER_SITE_OTHER): Payer: 59 | Admitting: Oncology

## 2020-04-02 ENCOUNTER — Telehealth: Payer: Self-pay | Admitting: Oncology

## 2020-04-02 ENCOUNTER — Other Ambulatory Visit: Payer: Self-pay | Admitting: Hematology and Oncology

## 2020-04-02 ENCOUNTER — Inpatient Hospital Stay: Payer: 59 | Attending: Oncology

## 2020-04-02 ENCOUNTER — Other Ambulatory Visit: Payer: Self-pay

## 2020-04-02 DIAGNOSIS — Z801 Family history of malignant neoplasm of trachea, bronchus and lung: Secondary | ICD-10-CM | POA: Insufficient documentation

## 2020-04-02 DIAGNOSIS — Z87891 Personal history of nicotine dependence: Secondary | ICD-10-CM | POA: Diagnosis not present

## 2020-04-02 DIAGNOSIS — R5383 Other fatigue: Secondary | ICD-10-CM | POA: Insufficient documentation

## 2020-04-02 DIAGNOSIS — Z8249 Family history of ischemic heart disease and other diseases of the circulatory system: Secondary | ICD-10-CM | POA: Diagnosis not present

## 2020-04-02 DIAGNOSIS — Z79899 Other long term (current) drug therapy: Secondary | ICD-10-CM | POA: Insufficient documentation

## 2020-04-02 DIAGNOSIS — Z800 Family history of malignant neoplasm of digestive organs: Secondary | ICD-10-CM | POA: Diagnosis not present

## 2020-04-02 LAB — CBC
MCV: 90 (ref 76–111)
RBC: 5.01 (ref 3.87–5.11)

## 2020-04-02 LAB — CBC AND DIFFERENTIAL
HCT: 45 (ref 36–46)
Hemoglobin: 15.3 (ref 12.0–16.0)
Neutrophils Absolute: 3.66
Platelets: 233 (ref 150–399)
WBC: 6.9

## 2020-04-02 LAB — IRON AND TIBC
Iron: 109 ug/dL (ref 28–170)
Saturation Ratios: 30 % (ref 10.4–31.8)
TIBC: 364 ug/dL (ref 250–450)
UIBC: 255 ug/dL

## 2020-04-02 LAB — FERRITIN: Ferritin: 298 ng/mL (ref 11–307)

## 2020-04-02 NOTE — Telephone Encounter (Signed)
Per 12/1 los 1week fu appt given to patient

## 2020-04-07 LAB — HEMOCHROMATOSIS DNA-PCR(C282Y,H63D)

## 2020-04-08 NOTE — Progress Notes (Signed)
[facility]  [address] [place],  [place]  [address] [phone]  Clinic Day:  04/09/2020  Referring physician: Dr Cher Nakai  HISTORY OF PRESENT ILLNESS:  The patient is a 64 y.o. female  who I recently began seeing for possible hemochromatosis.  She comes in today to go over her hemochromatosis mutation test results and their implications.  Since her last visit, the patient has been doing okay.  She still complains of joint aches, fatigue, and foggy thinking.     PHYSICAL EXAM:  Blood pressure (!) 168/94, pulse 73, temperature 97.7 F (36.5 C), resp. rate 16, height 5\' 7"  (1.702 m), weight 241 lb 8 oz (109.5 kg), last menstrual period 12/02/2002, SpO2 95 %. Wt Readings from Last 3 Encounters:  04/09/20 241 lb 8 oz (109.5 kg)  04/02/20 243 lb 1.6 oz (110.3 kg)  06/11/19 232 lb (105.2 kg)   Body mass index is 37.82 kg/m. Performance status (ECOG): 1 Physical Exam Constitutional:      Appearance: Normal appearance. She is not ill-appearing.  HENT:     Mouth/Throat:     Mouth: Mucous membranes are moist.     Pharynx: Oropharynx is clear. No oropharyngeal exudate or posterior oropharyngeal erythema.  Cardiovascular:     Rate and Rhythm: Normal rate and regular rhythm.     Heart sounds: No murmur heard.  No friction rub. No gallop.   Pulmonary:     Effort: Pulmonary effort is normal. No respiratory distress.     Breath sounds: Normal breath sounds. No wheezing, rhonchi or rales.  Abdominal:     General: Bowel sounds are normal. There is no distension.     Palpations: Abdomen is soft. There is no mass.     Tenderness: There is no abdominal tenderness.  Musculoskeletal:        General: No swelling.     Right lower leg: No edema.     Left lower leg: No edema.  Lymphadenopathy:     Cervical: No cervical adenopathy.     Upper Body:     Right upper body: No supraclavicular or axillary adenopathy.     Left upper body: No supraclavicular or  axillary adenopathy.     Lower Body: No right inguinal adenopathy. No left inguinal adenopathy.  Skin:    General: Skin is warm.     Coloration: Skin is not jaundiced.     Findings: No lesion or rash.  Neurological:     General: No focal deficit present.     Mental Status: She is alert and oriented to person, place, and time. Mental status is at baseline.     Cranial Nerves: Cranial nerves are intact.  Psychiatric:        Mood and Affect: Mood normal.        Behavior: Behavior normal.        Thought Content: Thought content normal.    LABS:       Ref. Range 04/02/2020 11:05  Iron Latest Ref Range: 28 - 170 ug/dL 109  UIBC Latest Units: ug/dL 255  TIBC Latest Ref Range: 250 - 450 ug/dL 364  Saturation Ratios Latest Ref Range: 10.4 - 31.8 % 30  Ferritin Latest Ref Range: 11 - 307 ng/mL 298    ASSESSMENT & PLAN:  A 64 y.o. 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  In clinic today, I explained to the patient that although she has hemochromatosis, she does not have the genotype that  is associated with severe iron overload and secondary organ damage.  For now, her iron parameters will be followed conservatively.  If I see a brisk rise in her ferritin and other iron parameters, I would consider performing a phlebotomy.  I will see her back in 6 months to reassess her iron parameters.  The patient understands all the plans discussed today and is in agreement with them.  Handsome Anglin Macarthur Critchley, MD

## 2020-04-09 ENCOUNTER — Inpatient Hospital Stay (INDEPENDENT_AMBULATORY_CARE_PROVIDER_SITE_OTHER): Payer: 59 | Admitting: Oncology

## 2020-04-09 ENCOUNTER — Other Ambulatory Visit: Payer: Self-pay

## 2020-04-09 ENCOUNTER — Telehealth: Payer: Self-pay | Admitting: Oncology

## 2020-04-09 ENCOUNTER — Encounter: Payer: Self-pay | Admitting: Oncology

## 2020-04-09 ENCOUNTER — Other Ambulatory Visit: Payer: Self-pay | Admitting: Oncology

## 2020-04-09 NOTE — Telephone Encounter (Signed)
Per 12/8 los next appt given to patient.

## 2020-06-11 ENCOUNTER — Ambulatory Visit: Payer: 59 | Admitting: Obstetrics and Gynecology

## 2020-07-10 ENCOUNTER — Encounter: Payer: Self-pay | Admitting: *Deleted

## 2020-07-10 DIAGNOSIS — Z1211 Encounter for screening for malignant neoplasm of colon: Secondary | ICD-10-CM | POA: Insufficient documentation

## 2020-07-10 DIAGNOSIS — R635 Abnormal weight gain: Secondary | ICD-10-CM | POA: Insufficient documentation

## 2020-07-10 DIAGNOSIS — Z860100 Personal history of colon polyps, unspecified: Secondary | ICD-10-CM | POA: Insufficient documentation

## 2020-07-10 DIAGNOSIS — R194 Change in bowel habit: Secondary | ICD-10-CM

## 2020-07-10 DIAGNOSIS — Z83719 Family history of colon polyps, unspecified: Secondary | ICD-10-CM

## 2020-07-10 DIAGNOSIS — Z8371 Family history of colonic polyps: Secondary | ICD-10-CM

## 2020-07-10 HISTORY — DX: Change in bowel habit: R19.4

## 2020-07-10 HISTORY — DX: Abnormal weight gain: R63.5

## 2020-07-10 HISTORY — DX: Family history of colon polyps, unspecified: Z83.719

## 2020-07-10 HISTORY — DX: Family history of colonic polyps: Z83.71

## 2020-07-11 ENCOUNTER — Encounter: Payer: Self-pay | Admitting: Cardiology

## 2020-07-11 ENCOUNTER — Other Ambulatory Visit: Payer: Self-pay

## 2020-07-11 ENCOUNTER — Ambulatory Visit: Payer: 59 | Admitting: Cardiology

## 2020-07-11 VITALS — BP 140/90 | HR 68 | Ht 67.0 in | Wt 241.0 lb

## 2020-07-11 DIAGNOSIS — E669 Obesity, unspecified: Secondary | ICD-10-CM

## 2020-07-11 DIAGNOSIS — R0602 Shortness of breath: Secondary | ICD-10-CM

## 2020-07-11 DIAGNOSIS — R0789 Other chest pain: Secondary | ICD-10-CM | POA: Diagnosis not present

## 2020-07-11 DIAGNOSIS — I4811 Longstanding persistent atrial fibrillation: Secondary | ICD-10-CM | POA: Diagnosis not present

## 2020-07-11 DIAGNOSIS — Z87891 Personal history of nicotine dependence: Secondary | ICD-10-CM | POA: Diagnosis not present

## 2020-07-11 DIAGNOSIS — G4733 Obstructive sleep apnea (adult) (pediatric): Secondary | ICD-10-CM

## 2020-07-11 DIAGNOSIS — I10 Essential (primary) hypertension: Secondary | ICD-10-CM | POA: Diagnosis not present

## 2020-07-11 DIAGNOSIS — Z9989 Dependence on other enabling machines and devices: Secondary | ICD-10-CM

## 2020-07-11 DIAGNOSIS — E782 Mixed hyperlipidemia: Secondary | ICD-10-CM

## 2020-07-11 DIAGNOSIS — E1100 Type 2 diabetes mellitus with hyperosmolarity without nonketotic hyperglycemic-hyperosmolar coma (NKHHC): Secondary | ICD-10-CM

## 2020-07-11 MED ORDER — ASPIRIN EC 81 MG PO TBEC: 81.0000 mg | DELAYED_RELEASE_TABLET | Freq: Every day | ORAL | 3 refills | Status: DC

## 2020-07-11 MED ORDER — NITROGLYCERIN 0.4 MG SL SUBL: 0.4000 mg | SUBLINGUAL_TABLET | SUBLINGUAL | 3 refills | Status: DC | PRN

## 2020-07-11 NOTE — H&P (View-Only) (Signed)
Cardiology Office Note:    Date:  07/11/2020   ID:  Melissa Boyer, DOB May 13, 1955, MRN [id number]  PCP:  Cher Nakai, MD  Cardiologist:  No primary care provider on file.  Electrophysiologist:  None   Referring MD: Cher Nakai, MD   Chief Complaint  Patient presents with  . Atrial Fibrillation   I have been experiencing worsening chest discomfort.  History of Present Illness:    Melissa Boyer is a 65 y.o. female with a hx of diabetes mellitus, hyperlipidemia, atrial fibrillation on Eliquis which was diagnosed in 2019 the patient has not really follow-up with cardiology, obesity, former smoker, sleep apnea on CPAP, hypothyroidism is here today to be evaluated for chest discomfort and shortness of breath.  The patient tells me for the last several months she has had worsening chest discomfort.  She described this as a midsternal chest sensation which feels like a squeezing and tightness.  Most times it feels like a burning sensation that started in her mid chest radiating up to her jaw and in her neck area.  She admits to associated shortness of breath.  She tells me on exertion this happens when she sits and rests it resolves.  The shortness of breath is not getting worse.  When she had these symptoms with the chest pain or shortness of breath after that she feels completely wiped out she feels tired and cannot do anything.  She is concerned.  Past Medical History:  Diagnosis Date  . Abnormal weight gain 07/10/2020  . Atrial fibrillation ([place])   . Change in bowel habit 07/10/2020  . DES exposure in utero   . DM2 (diabetes mellitus, type 2) ([place])   . Essential hypertension 01/01/2014  . Family history of breast cancer in first degree relative 01/01/2014  . Family history of colonic polyps 07/10/2020  . Fibroids   . Hepatitis C 11/99   2 yr tx, "remission", told Ag neg  . History of diabetes mellitus   . 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  Hypercholesteremia   . Hypertension   . Left renal mass 12/29/2019  . Mixed hyperlipidemia 02/27/2019  . Obesity (BMI 35.0-39.9 without comorbidity) 12/29/2019  . Obstructive sleep apnea on CPAP 06/19/2019  . Paroxysmal atrial fibrillation ([place]) 02/27/2019  . Pure hypercholesterolemia 01/01/2014  . Thyroid disease    hypothyroid  . Unspecified hypothyroidism 01/01/2014    Past Surgical History:  Procedure Laterality Date  . COLPOSCOPY  10/98   normal, no biopsy  . ENDOMETRIAL ABLATION  12/02/2002  . ENDOMETRIAL BIOPSY  5/03  . THYROIDECTOMY, PARTIAL     thyroid nodule excision left side    Current Medications: Current Meds  Medication Sig  . ALPRAZolam (XANAX) 0.5 MG tablet Take 0.5 mg by mouth 2 (two) times daily as needed for anxiety.  Marland Kitchen amLODipine (NORVASC) 5 MG tablet Take 2.5 mg by mouth at bedtime.  Marland Kitchen apixaban (ELIQUIS) 5 MG TABS tablet Take 5 mg by mouth 2 (two) times daily.  Marland Kitchen aspirin EC 81 MG tablet Take 1 tablet (81 mg total) by mouth daily. Swallow whole.  . hydrochlorothiazide (HYDRODIURIL) 12.5 MG tablet Take 12.5 mg by mouth daily.  Marland Kitchen levothyroxine (SYNTHROID, LEVOTHROID) 125 MCG tablet Take 125 mcg by mouth daily before breakfast.  . losartan (COZAAR) 100 MG tablet Take 100 mg by mouth daily.  . meloxicam (MOBIC) 7.5 MG tablet Take 7.5 mg by mouth every other day.  . metFORMIN (GLUCOPHAGE) 500  MG tablet Take 500 mg by mouth 2 (two) times daily with a meal.  . metoprolol succinate (TOPROL-XL) 100 MG 24 hr tablet Take 100 mg by mouth daily.  . nitroGLYCERIN (NITROSTAT) 0.4 MG SL tablet Place 1 tablet (0.4 mg total) under the tongue every 5 (five) minutes as needed for chest pain.  Marland Kitchen triazolam (HALCION) 0.25 MG tablet Take 0.25 mg by mouth at bedtime.  . TRULICITY 1.5 ZJ/6.9CV SOPN Inject 1.5 mg into the skin once a week.     Allergies:   Patient has no known allergies.   Social History   Socioeconomic History  . Marital status: Married    Spouse name: Not on file   . Number of children: 1  . Years of education: Not on file  . Highest education level: Not on file  Occupational History  . Not on file  Tobacco Use  . Smoking status: Former Smoker    Quit date: 07/11/2005    Years since quitting: 15.0  . Smokeless tobacco: Never Used  Vaping Use  . Vaping Use: Never used  Substance and Sexual Activity  . Alcohol use: Never  . Drug use: No  . Sexual activity: Yes    Partners: Male    Birth control/protection: Post-menopausal  Other Topics Concern  . Not on file  Social History Narrative  . Not on file   Social Determinants of Health   Financial Resource Strain: Not on file  Food Insecurity: Not on file  Transportation Needs: Not on file  Physical Activity: Not on file  Stress: Not on file  Social Connections: Not on file     Family History: The patient's family history includes Cancer in her maternal aunt, mother, and sister; Diabetes in her maternal grandfather; Emphysema in her father; Heart attack in her maternal aunt and maternal grandmother; Hypertension in her father.  ROS:   Review of Systems  Constitution: Negative for decreased appetite, fever and weight gain.  HENT: Negative for congestion, ear discharge, hoarse voice and sore throat.   Eyes: Negative for discharge, redness, vision loss in right eye and visual halos.  Cardiovascular: Negative for chest pain, dyspnea on exertion, leg swelling, orthopnea and palpitations.  Respiratory: Negative for cough, hemoptysis, shortness of breath and snoring.   Endocrine: Negative for heat intolerance and polyphagia.  Hematologic/Lymphatic: Negative for bleeding problem. Does not bruise/bleed easily.  Skin: Negative for flushing, nail changes, rash and suspicious lesions.  Musculoskeletal: Negative for arthritis, joint pain, muscle cramps, myalgias, neck pain and stiffness.  Gastrointestinal: Negative for abdominal pain, bowel incontinence, diarrhea and excessive appetite.   Genitourinary: Negative for decreased libido, genital sores and incomplete emptying.  Neurological: Negative for brief paralysis, focal weakness, headaches and loss of balance.  Psychiatric/Behavioral: Negative for altered mental status, depression and suicidal ideas.  Allergic/Immunologic: Negative for HIV exposure and persistent infections.    EKGs/Labs/Other Studies Reviewed:    The following studies were reviewed today:   EKG:  The ekg ordered today demonstrates atrial fibrillation, heart rate 68 bpm with occasional PVCs, poor precordial progression cannot rule out old septal infarction.  Recent Labs: 04/02/2020: Hemoglobin 15.3; Platelets 233  Recent Lipid Panel No results found for: CHOL, TRIG, HDL, CHOLHDL, VLDL, LDLCALC, LDLDIRECT  Physical Exam:    VS:  BP 140/90 (BP Location: Right Arm, Patient Position: Sitting, Cuff Size: Large)   Pulse 68   Ht 5\' 7"  (1.702 m)   Wt 241 lb (109.3 kg)   LMP 12/02/2002 (Approximate)   SpO2  95%   BMI 37.75 kg/m     Wt Readings from Last 3 Encounters:  07/11/20 241 lb (109.3 kg)  04/09/20 241 lb 8 oz (109.5 kg)  04/02/20 243 lb 1.6 oz (110.3 kg)     GEN: Well nourished, well developed in no acute distress HEENT: Normal NECK: No JVD; No carotid bruits LYMPHATICS: No lymphadenopathy CARDIAC: S1S2 noted,RRR, no murmurs, rubs, gallops RESPIRATORY:  Clear to auscultation without rales, wheezing or rhonchi  ABDOMEN: Soft, non-tender, non-distended, +bowel sounds, no guarding. EXTREMITIES: No edema, No cyanosis, no clubbing MUSCULOSKELETAL:  No deformity  SKIN: Warm and dry NEUROLOGIC:  Alert and oriented x 3, non-focal PSYCHIATRIC:  Normal affect, good insight  ASSESSMENT:    1. Chest pressure   2. Shortness of breath   3. Longstanding persistent atrial fibrillation ([place])   4. Former smoker   5. Hypertension, unspecified type   6. Obesity (BMI 30-39.9)   7. Mixed hyperlipidemia   8. Type 2 diabetes mellitus with  hyperosmolarity without coma, without long-term current use of insulin ([place])   9. Obstructive sleep apnea on CPAP    PLAN:     Her chest pain is concerning for typical angina in this female.  She had a stress test in 2019 which was normal.  She has significant risk (intermediate to high risk) factors for coronary artery disease which includes diabetes mellitus, hypertension, hyperlipidemia, age, former smoker, obesity.  I like to proceed with a left heart catheterization in this patient.  Have discussed with her she is agreeable to proceed.  The patient understands that risks include but are not limited to stroke (1 in 1000), death (1 in 65), kidney failure [usually temporary] (1 in 500), bleeding (1 in 200), allergic reaction [possibly serious] (1 in 200), and agrees to proceed.  Sublingual nitroglycerin prescription was sent, its protocol and 911 protocol explained and the patient vocalized understanding questions were answered to the patient's satisfaction.  I am also going to start the patient aspirin 81 mg daily. She is in atrial fibrillation today.  She is rate controlled.  She is on anticoagulation.  Once we complete the patient heart catheterization if she remains in atrial fibrillation while I like to do is have the patient undergo a TEE cardioversion hopefully this will help.  She also will get an echocardiogram to assess her LV function to understand her left atrial size as well.  I reviewed her lipid profile from her PCP office which shows LDL 128, triglyceride 169, HDL 45 and total cholesterol 207.  She tells me that she was on rosuvastatin did not really treat her well and she just was started on Lipitor by her PCP.  She is going to pick up this medication today. I have encouraged the patient to continue her statin medication as long as she can tolerate. Continue observation on her current antihypertensive regimen.  This is being managed by his primary care doctor.  No adjustments for  antidiabetic medications were made today. The patient understands the need to lose weight with diet and exercise. We have discussed specific strategies for this. The patient is in agreement with the above plan. The patient left the office in stable condition.  The patient will follow up in 2 weeks post heart catheterization.   Medication Adjustments/Labs and Tests Ordered: Current medicines are reviewed at length with the patient today.  Concerns regarding medicines are outlined above.  Orders Placed This Encounter  Procedures  . Basic metabolic panel  . Magnesium  .  CBC with Differential/Platelet  . EKG 12-Lead  . ECHOCARDIOGRAM COMPLETE   Meds ordered this encounter  Medications  . nitroGLYCERIN (NITROSTAT) 0.4 MG SL tablet    Sig: Place 1 tablet (0.4 mg total) under the tongue every 5 (five) minutes as needed for chest pain.    Dispense:  90 tablet    Refill:  3  . aspirin EC 81 MG tablet    Sig: Take 1 tablet (81 mg total) by mouth daily. Swallow whole.    Dispense:  90 tablet    Refill:  3    Patient Instructions  Medication Instructions:  Your physician has recommended you make the following change in your medication:  START: Nitroglycerin 0.4 mg take one tablet by mouth every 5 minutes up to three times as needed for chest pain. START: Aspirin 81 mg daily  *If you need a refill on your cardiac medications before your next appointment, please call your pharmacy*   Lab Work: Your physician recommends that you return for lab work: Monday: BMET, Mag, CBC If you have labs (blood work) drawn today and your tests are completely normal, you will receive your results only by: Marland Kitchen MyChart Message (if you have MyChart) OR . A paper copy in the mail If you have any lab test that is abnormal or we need to change your treatment, we will call you to review the results.   Testing/Procedures: Your physician has requested that you have an echocardiogram. Echocardiography is a painless  test that uses sound waves to create images of your heart. It provides your doctor with information about the size and shape of your heart and how well your heart's chambers and valves are working. This procedure takes approximately one hour. There are no restrictions for this procedure.     [address] [place] [address] Dept: [phone] Loc: [phone]  Melissa Boyer  07/11/2020  You are scheduled for a Cardiac Catheterization on Friday, March 18 with Dr. Larae Grooms.  1. Please arrive at the [facility] (Main Entrance A) at [facility]: [address] [place], [place] [address] at 8:30 AM (This time is two hours before your procedure to ensure your preparation). Free valet parking service is available.   Special note: Every effort is made to have your procedure done on time. Please understand that emergencies sometimes delay scheduled procedures.  2. Diet: Do not eat solid foods after midnight.  The patient may have clear liquids until 5am upon the day of the procedure.  3. Labs: You will need to have blood drawn on   4. Medication instructions in preparation for your procedure:   Contrast Allergy: No    Stop taking Eliquis (Apixiban) on Wednesday, March 16.   Hold Metformin day of and for 48 hours after.   On the morning of your procedure, take your Aspirin and any morning medicines NOT listed above.  You may use sips of water.  5. Plan for one night stay--bring personal belongings. 6. Bring a current list of your medications and current insurance cards. 7. You MUST have a responsible person to drive you home. 8. Someone MUST be with you the first 24 hours after you arrive home or your discharge will be delayed. 9. Please wear clothes that are easy to get on and off and wear slip-on shoes.  Thank you for allowing [place] to care for you!   -- [facility]  Invasive Cardiovascular services   Follow-Up: At [facility], you and your health needs are our priority.  As part of our continuing mission to provide you with exceptional heart care, we have created designated Provider Care Teams.  These Care Teams include your primary Cardiologist (physician) and Advanced Practice Providers (APPs -  Physician Assistants and Nurse Practitioners) who all work together to provide you with the care you need, when you need it.  We recommend signing up for the patient portal called "MyChart".  Sign up information is provided on this After Visit Summary.  MyChart is used to connect with patients for Virtual Visits (Telemedicine).  Patients are able to view lab/test results, encounter notes, upcoming appointments, etc.  Non-urgent messages can be sent to your provider as well.   To learn more about what you can do with MyChart, go to [email or web address].    Your next appointment:   2 week(s) after Cath  The format for your next appointment:   In Person  Provider:   Berniece Salines, DO   Other Instructions      Adopting a Healthy Lifestyle.  Know what a healthy weight is for you (roughly BMI <25) and aim to maintain this   Aim for 7+ servings of fruits and vegetables daily   65-80+ fluid ounces of water or unsweet tea for healthy kidneys   Limit to max 1 drink of alcohol per day; avoid smoking/tobacco   Limit animal fats in diet for cholesterol and heart health - choose grass fed whenever available   Avoid highly processed foods, and foods high in saturated/trans fats   Aim for low stress - take time to unwind and care for your mental health   Aim for 150 min of moderate intensity exercise weekly for heart health, and weights twice weekly for bone health   Aim for 7-9 hours of sleep daily   When it comes to diets, agreement about the perfect plan isnt easy to find, even among the experts. Experts at the [employer]  developed an idea known as the Healthy Eating Plate. Just imagine a plate divided into logical, healthy portions.   The emphasis is on diet quality:   Load up on vegetables and fruits - one-half of your plate: Aim for color and variety, and remember that potatoes dont count.   Go for whole grains - one-quarter of your plate: Whole wheat, barley, wheat berries, quinoa, oats, brown rice, and foods made with them. If you want pasta, go with whole wheat pasta.   Protein power - one-quarter of your plate: Fish, chicken, beans, and nuts are all healthy, versatile protein sources. Limit red meat.   The diet, however, does go beyond the plate, offering a few other suggestions.   Use healthy plant oils, such as olive, canola, soy, corn, sunflower and peanut. Check the labels, and avoid partially hydrogenated oil, which have unhealthy trans fats.   If youre thirsty, drink water. Coffee and tea are good in moderation, but skip sugary drinks and limit milk and dairy products to one or two daily servings.   The type of carbohydrate in the diet is more important than the amount. Some sources of carbohydrates, such as vegetables, fruits, whole grains, and beans-are healthier than others.   Finally, stay active  Signed, Berniece Salines, DO  07/11/2020 4:49 PM    [facility]

## 2020-07-11 NOTE — Patient Instructions (Addendum)
Medication Instructions:  Your physician has recommended you make the following change in your medication:  START: Nitroglycerin 0.4 mg take one tablet by mouth every 5 minutes up to three times as needed for chest pain. START: Aspirin 81 mg daily  *If you need a refill on your cardiac medications before your next appointment, please call your pharmacy*   Lab Work: Your physician recommends that you return for lab work: Monday: BMET, Mag, CBC If you have labs (blood work) drawn today and your tests are completely normal, you will receive your results only by: Marland Kitchen MyChart Message (if you have MyChart) OR . A paper copy in the mail If you have any lab test that is abnormal or we need to change your treatment, we will call you to review the results.   Testing/Procedures: Your physician has requested that you have an echocardiogram. Echocardiography is a painless test that uses sound waves to create images of your heart. It provides your doctor with information about the size and shape of your heart and how well your heart's chambers and valves are working. This procedure takes approximately one hour. There are no restrictions for this procedure.     [address] [place] [address] Dept: [phone] Loc: [phone]  Melissa Boyer  07/11/2020  You are scheduled for a Cardiac Catheterization on Friday, March 18 with Dr. Larae Grooms.  1. Please arrive at the [facility] (Main Entrance A) at [facility]: [address] [place],  [address] at 8:30 AM (This time is two hours before your procedure to ensure your preparation). Free valet parking service is available.   Special note: Every effort is made to have your procedure done on time. Please understand that emergencies sometimes delay scheduled procedures.  2. Diet: Do not eat solid foods after midnight.  The patient  may have clear liquids until 5am upon the day of the procedure.  3. Labs: You will need to have blood drawn on   4. Medication instructions in preparation for your procedure:   Contrast Allergy: No    Stop taking Eliquis (Apixiban) on Wednesday, March 16.   Hold Metformin day of and for 48 hours after.   On the morning of your procedure, take your Aspirin and any morning medicines NOT listed above.  You may use sips of water.  5. Plan for one night stay--bring personal belongings. 6. Bring a current list of your medications and current insurance cards. 7. You MUST have a responsible person to drive you home. 8. Someone MUST be with you the first 24 hours after you arrive home or your discharge will be delayed. 9. Please wear clothes that are easy to get on and off and wear slip-on shoes.  Thank you for allowing [place] to care for you!   -- [place] Invasive Cardiovascular services   Follow-Up: At [facility], you and your health needs are our priority.  As part of our continuing mission to provide you with exceptional heart care, we have created designated Provider Care Teams.  These Care Teams include your primary Cardiologist (physician) and Advanced Practice Providers (APPs -  Physician Assistants and Nurse Practitioners) who all work together to provide you with the care you need, when you need it.  We recommend signing up for the patient portal called "MyChart".  Sign up information is provided on this After Visit Summary.  MyChart is used to  connect with patients for Virtual Visits (Telemedicine).  Patients are able to view lab/test results, encounter notes, upcoming appointments, etc.  Non-urgent messages can be sent to your provider as well.   To learn more about what you can do with MyChart, go to [email or web address].    Your next appointment:   2 week(s) after Cath  The format for your next appointment:   In Person  Provider:   Berniece Salines, DO   Other  Instructions

## 2020-07-11 NOTE — Progress Notes (Signed)
Cardiology Office Note:    Date:  07/11/2020   ID:  Melissa Boyer, DOB [birth year], MRN [id number]  PCP:  Cher Nakai, MD  Cardiologist:  No primary care provider on file.  Electrophysiologist:  None   Referring MD: Cher Nakai, MD   Chief Complaint  Patient presents with  . Atrial Fibrillation   I have been experiencing worsening chest discomfort.  History of Present Illness:    Melissa Boyer is a 65 y.o. female with a hx of diabetes mellitus, hyperlipidemia, atrial fibrillation on Eliquis which was diagnosed in 2019 the patient has not really follow-up with cardiology, obesity, former smoker, sleep apnea on CPAP, hypothyroidism is here today to be evaluated for chest discomfort and shortness of breath.  The patient tells me for the last several months she has had worsening chest discomfort.  She described this as a midsternal chest sensation which feels like a squeezing and tightness.  Most times it feels like a burning sensation that started in her mid chest radiating up to her jaw and in her neck area.  She admits to associated shortness of breath.  She tells me on exertion this happens when she sits and rests it resolves.  The shortness of breath is not getting worse.  When she had these symptoms with the chest pain or shortness of breath after that she feels completely wiped out she feels tired and cannot do anything.  She is concerned.  Past Medical History:  Diagnosis Date  . Abnormal weight gain 07/10/2020  . Atrial fibrillation ([place])   . Change in bowel habit 07/10/2020  . DES exposure in utero   . DM2 (diabetes mellitus, type 2) ([place])   . Essential hypertension 01/01/2014  . Family history of breast cancer in first degree relative 01/01/2014  . Family history of colonic polyps 07/10/2020  . Fibroids   . Hepatitis C 11/99   2 yr tx, "remission", told Ag neg  . History of diabetes mellitus   . 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  Hypercholesteremia   . Hypertension   . Left renal mass 12/29/2019  . Mixed hyperlipidemia 02/27/2019  . Obesity (BMI 35.0-39.9 without comorbidity) 12/29/2019  . Obstructive sleep apnea on CPAP 06/19/2019  . Paroxysmal atrial fibrillation ([place]) 02/27/2019  . Pure hypercholesterolemia 01/01/2014  . Thyroid disease    hypothyroid  . Unspecified hypothyroidism 01/01/2014    Past Surgical History:  Procedure Laterality Date  . COLPOSCOPY  10/98   normal, no biopsy  . ENDOMETRIAL ABLATION  12/02/2002  . ENDOMETRIAL BIOPSY  5/03  . THYROIDECTOMY, PARTIAL     thyroid nodule excision left side    Current Medications: Current Meds  Medication Sig  . ALPRAZolam (XANAX) 0.5 MG tablet Take 0.5 mg by mouth 2 (two) times daily as needed for anxiety.  Marland Kitchen amLODipine (NORVASC) 5 MG tablet Take 2.5 mg by mouth at bedtime.  Marland Kitchen apixaban (ELIQUIS) 5 MG TABS tablet Take 5 mg by mouth 2 (two) times daily.  Marland Kitchen aspirin EC 81 MG tablet Take 1 tablet (81 mg total) by mouth daily. Swallow whole.  . hydrochlorothiazide (HYDRODIURIL) 12.5 MG tablet Take 12.5 mg by mouth daily.  Marland Kitchen levothyroxine (SYNTHROID, LEVOTHROID) 125 MCG tablet Take 125 mcg by mouth daily before breakfast.  . losartan (COZAAR) 100 MG tablet Take 100 mg by mouth daily.  . meloxicam (MOBIC) 7.5 MG tablet Take 7.5 mg by mouth every other day.  . metFORMIN (GLUCOPHAGE) 500  MG tablet Take 500 mg by mouth 2 (two) times daily with a meal.  . metoprolol succinate (TOPROL-XL) 100 MG 24 hr tablet Take 100 mg by mouth daily.  . nitroGLYCERIN (NITROSTAT) 0.4 MG SL tablet Place 1 tablet (0.4 mg total) under the tongue every 5 (five) minutes as needed for chest pain.  Marland Kitchen triazolam (HALCION) 0.25 MG tablet Take 0.25 mg by mouth at bedtime.  . TRULICITY 1.5 YI/9.4WN SOPN Inject 1.5 mg into the skin once a week.     Allergies:   Patient has no known allergies.   Social History   Socioeconomic History  . Marital status: Married    Spouse name: Not on file   . Number of children: 1  . Years of education: Not on file  . Highest education level: Not on file  Occupational History  . Not on file  Tobacco Use  . Smoking status: Former Smoker    Quit date: 07/11/2005    Years since quitting: 15.0  . Smokeless tobacco: Never Used  Vaping Use  . Vaping Use: Never used  Substance and Sexual Activity  . Alcohol use: Never  . Drug use: No  . Sexual activity: Yes    Partners: Male    Birth control/protection: Post-menopausal  Other Topics Concern  . Not on file  Social History Narrative  . Not on file   Social Determinants of Health   Financial Resource Strain: Not on file  Food Insecurity: Not on file  Transportation Needs: Not on file  Physical Activity: Not on file  Stress: Not on file  Social Connections: Not on file     Family History: The patient's family history includes Cancer in her maternal aunt, mother, and sister; Diabetes in her maternal grandfather; Emphysema in her father; Heart attack in her maternal aunt and maternal grandmother; Hypertension in her father.  ROS:   Review of Systems  Constitution: Negative for decreased appetite, fever and weight gain.  HENT: Negative for congestion, ear discharge, hoarse voice and sore throat.   Eyes: Negative for discharge, redness, vision loss in right eye and visual halos.  Cardiovascular: Negative for chest pain, dyspnea on exertion, leg swelling, orthopnea and palpitations.  Respiratory: Negative for cough, hemoptysis, shortness of breath and snoring.   Endocrine: Negative for heat intolerance and polyphagia.  Hematologic/Lymphatic: Negative for bleeding problem. Does not bruise/bleed easily.  Skin: Negative for flushing, nail changes, rash and suspicious lesions.  Musculoskeletal: Negative for arthritis, joint pain, muscle cramps, myalgias, neck pain and stiffness.  Gastrointestinal: Negative for abdominal pain, bowel incontinence, diarrhea and excessive appetite.   Genitourinary: Negative for decreased libido, genital sores and incomplete emptying.  Neurological: Negative for brief paralysis, focal weakness, headaches and loss of balance.  Psychiatric/Behavioral: Negative for altered mental status, depression and suicidal ideas.  Allergic/Immunologic: Negative for HIV exposure and persistent infections.    EKGs/Labs/Other Studies Reviewed:    The following studies were reviewed today:   EKG:  The ekg ordered today demonstrates atrial fibrillation, heart rate 68 bpm with occasional PVCs, poor precordial progression cannot rule out old septal infarction.  Recent Labs: 04/02/2020: Hemoglobin 15.3; Platelets 233  Recent Lipid Panel No results found for: CHOL, TRIG, HDL, CHOLHDL, VLDL, LDLCALC, LDLDIRECT  Physical Exam:    VS:  BP 140/90 (BP Location: Right Arm, Patient Position: Sitting, Cuff Size: Large)   Pulse 68   Ht 5\' 7"  (1.702 m)   Wt 241 lb (109.3 kg)   LMP 12/02/2002 (Approximate)   SpO2  95%   BMI 37.75 kg/m     Wt Readings from Last 3 Encounters:  07/11/20 241 lb (109.3 kg)  04/09/20 241 lb 8 oz (109.5 kg)  04/02/20 243 lb 1.6 oz (110.3 kg)     GEN: Well nourished, well developed in no acute distress HEENT: Normal NECK: No JVD; No carotid bruits LYMPHATICS: No lymphadenopathy CARDIAC: S1S2 noted,RRR, no murmurs, rubs, gallops RESPIRATORY:  Clear to auscultation without rales, wheezing or rhonchi  ABDOMEN: Soft, non-tender, non-distended, +bowel sounds, no guarding. EXTREMITIES: No edema, No cyanosis, no clubbing MUSCULOSKELETAL:  No deformity  SKIN: Warm and dry NEUROLOGIC:  Alert and oriented x 3, non-focal PSYCHIATRIC:  Normal affect, good insight  ASSESSMENT:    1. Chest pressure   2. Shortness of breath   3. Longstanding persistent atrial fibrillation ([place])   4. Former smoker   5. Hypertension, unspecified type   6. Obesity (BMI 30-39.9)   7. Mixed hyperlipidemia   8. Type 2 diabetes mellitus with  hyperosmolarity without coma, without long-term current use of insulin ([place])   9. Obstructive sleep apnea on CPAP    PLAN:     Her chest pain is concerning for typical angina in this female.  She had a stress test in 2019 which was normal.  She has significant risk (intermediate to high risk) factors for coronary artery disease which includes diabetes mellitus, hypertension, hyperlipidemia, age, former smoker, obesity.  I like to proceed with a left heart catheterization in this patient.  Have discussed with her she is agreeable to proceed.  The patient understands that risks include but are not limited to stroke (1 in 1000), death (1 in 82), kidney failure [usually temporary] (1 in 500), bleeding (1 in 200), allergic reaction [possibly serious] (1 in 200), and agrees to proceed.  Sublingual nitroglycerin prescription was sent, its protocol and 911 protocol explained and the patient vocalized understanding questions were answered to the patient's satisfaction.  I am also going to start the patient aspirin 81 mg daily. She is in atrial fibrillation today.  She is rate controlled.  She is on anticoagulation.  Once we complete the patient heart catheterization if she remains in atrial fibrillation while I like to do is have the patient undergo a TEE cardioversion hopefully this will help.  She also will get an echocardiogram to assess her LV function to understand her left atrial size as well.  I reviewed her lipid profile from her PCP office which shows LDL 128, triglyceride 169, HDL 45 and total cholesterol 207.  She tells me that she was on rosuvastatin did not really treat her well and she just was started on Lipitor by her PCP.  She is going to pick up this medication today. I have encouraged the patient to continue her statin medication as long as she can tolerate. Continue observation on her current antihypertensive regimen.  This is being managed by his primary care doctor.  No adjustments for  antidiabetic medications were made today. The patient understands the need to lose weight with diet and exercise. We have discussed specific strategies for this. The patient is in agreement with the above plan. The patient left the office in stable condition.  The patient will follow up in 2 weeks post heart catheterization.   Medication Adjustments/Labs and Tests Ordered: Current medicines are reviewed at length with the patient today.  Concerns regarding medicines are outlined above.  Orders Placed This Encounter  Procedures  . Basic metabolic panel  . Magnesium  .  CBC with Differential/Platelet  . EKG 12-Lead  . ECHOCARDIOGRAM COMPLETE   Meds ordered this encounter  Medications  . nitroGLYCERIN (NITROSTAT) 0.4 MG SL tablet    Sig: Place 1 tablet (0.4 mg total) under the tongue every 5 (five) minutes as needed for chest pain.    Dispense:  90 tablet    Refill:  3  . aspirin EC 81 MG tablet    Sig: Take 1 tablet (81 mg total) by mouth daily. Swallow whole.    Dispense:  90 tablet    Refill:  3    Patient Instructions  Medication Instructions:  Your physician has recommended you make the following change in your medication:  START: Nitroglycerin 0.4 mg take one tablet by mouth every 5 minutes up to three times as needed for chest pain. START: Aspirin 81 mg daily  *If you need a refill on your cardiac medications before your next appointment, please call your pharmacy*   Lab Work: Your physician recommends that you return for lab work: Monday: BMET, Mag, CBC If you have labs (blood work) drawn today and your tests are completely normal, you will receive your results only by: Marland Kitchen MyChart Message (if you have MyChart) OR . A paper copy in the mail If you have any lab test that is abnormal or we need to change your treatment, we will call you to review the results.   Testing/Procedures: Your physician has requested that you have an echocardiogram. Echocardiography is a painless  test that uses sound waves to create images of your heart. It provides your doctor with information about the size and shape of your heart and how well your heart's chambers and valves are working. This procedure takes approximately one hour. There are no restrictions for this procedure.     [address] [place] [address] Dept: [phone] Loc: [phone]  Melissa Boyer  07/11/2020  You are scheduled for a Cardiac Catheterization on Friday, March 18 with Dr. Larae Grooms.  1. Please arrive at the [facility] (Main Entrance A) at [facility]: [address] [place],  [address] at 8:30 AM (This time is two hours before your procedure to ensure your preparation). Free valet parking service is available.   Special note: Every effort is made to have your procedure done on time. Please understand that emergencies sometimes delay scheduled procedures.  2. Diet: Do not eat solid foods after midnight.  The patient may have clear liquids until 5am upon the day of the procedure.  3. Labs: You will need to have blood drawn on   4. Medication instructions in preparation for your procedure:   Contrast Allergy: No    Stop taking Eliquis (Apixiban) on Wednesday, March 16.   Hold Metformin day of and for 48 hours after.   On the morning of your procedure, take your Aspirin and any morning medicines NOT listed above.  You may use sips of water.  5. Plan for one night stay--bring personal belongings. 6. Bring a current list of your medications and current insurance cards. 7. You MUST have a responsible person to drive you home. 8. Someone MUST be with you the first 24 hours after you arrive home or your discharge will be delayed. 9. Please wear clothes that are easy to get on and off and wear slip-on shoes.  Thank you for allowing [place] to care for you!   -- [facility]  Invasive Cardiovascular services   Follow-Up: At [facility], you and your health needs are our priority.  As part of our continuing mission to provide you with exceptional heart care, we have created designated Provider Care Teams.  These Care Teams include your primary Cardiologist (physician) and Advanced Practice Providers (APPs -  Physician Assistants and Nurse Practitioners) who all work together to provide you with the care you need, when you need it.  We recommend signing up for the patient portal called "MyChart".  Sign up information is provided on this After Visit Summary.  MyChart is used to connect with patients for Virtual Visits (Telemedicine).  Patients are able to view lab/test results, encounter notes, upcoming appointments, etc.  Non-urgent messages can be sent to your provider as well.   To learn more about what you can do with MyChart, go to [email or web address].    Your next appointment:   2 week(s) after Cath  The format for your next appointment:   In Person  Provider:   Berniece Salines, DO   Other Instructions      Adopting a Healthy Lifestyle.  Know what a healthy weight is for you (roughly BMI <25) and aim to maintain this   Aim for 7+ servings of fruits and vegetables daily   65-80+ fluid ounces of water or unsweet tea for healthy kidneys   Limit to max 1 drink of alcohol per day; avoid smoking/tobacco   Limit animal fats in diet for cholesterol and heart health - choose grass fed whenever available   Avoid highly processed foods, and foods high in saturated/trans fats   Aim for low stress - take time to unwind and care for your mental health   Aim for 150 min of moderate intensity exercise weekly for heart health, and weights twice weekly for bone health   Aim for 7-9 hours of sleep daily   When it comes to diets, agreement about the perfect plan isnt easy to find, even among the experts. Experts at the [employer]  developed an idea known as the Healthy Eating Plate. Just imagine a plate divided into logical, healthy portions.   The emphasis is on diet quality:   Load up on vegetables and fruits - one-half of your plate: Aim for color and variety, and remember that potatoes dont count.   Go for whole grains - one-quarter of your plate: Whole wheat, barley, wheat berries, quinoa, oats, brown rice, and foods made with them. If you want pasta, go with whole wheat pasta.   Protein power - one-quarter of your plate: Fish, chicken, beans, and nuts are all healthy, versatile protein sources. Limit red meat.   The diet, however, does go beyond the plate, offering a few other suggestions.   Use healthy plant oils, such as olive, canola, soy, corn, sunflower and peanut. Check the labels, and avoid partially hydrogenated oil, which have unhealthy trans fats.   If youre thirsty, drink water. Coffee and tea are good in moderation, but skip sugary drinks and limit milk and dairy products to one or two daily servings.   The type of carbohydrate in the diet is more important than the amount. Some sources of carbohydrates, such as vegetables, fruits, whole grains, and beans-are healthier than others.   Finally, stay active  Signed, Berniece Salines, DO  07/11/2020 4:49 PM    [facility]

## 2020-07-15 ENCOUNTER — Telehealth: Payer: Self-pay

## 2020-07-15 LAB — BASIC METABOLIC PANEL
BUN/Creatinine Ratio: 17 (ref 12–28)
BUN: 14 mg/dL (ref 8–27)
CO2: 21 mmol/L (ref 20–29)
Calcium: 9.2 mg/dL (ref 8.7–10.3)
Chloride: 99 mmol/L (ref 96–106)
Creatinine, Ser: 0.84 mg/dL (ref 0.57–1.00)
Glucose: 171 mg/dL — ABNORMAL HIGH (ref 65–99)
Potassium: 3.8 mmol/L (ref 3.5–5.2)
Sodium: 139 mmol/L (ref 134–144)
eGFR: 77 mL/min/{1.73_m2} (ref >59)

## 2020-07-15 LAB — CBC WITH DIFFERENTIAL/PLATELET
Basophils Absolute: 0.1 10*3/uL (ref 0.0–0.2)
Basos: 1 %
EOS (ABSOLUTE): 0.2 10*3/uL (ref 0.0–0.4)
Eos: 2 %
Hematocrit: 46.2 % (ref 34.0–46.6)
Hemoglobin: 15.8 g/dL (ref 11.1–15.9)
Immature Grans (Abs): 0 10*3/uL (ref 0.0–0.1)
Immature Granulocytes: 0 %
Lymphocytes Absolute: 3.1 10*3/uL (ref 0.7–3.1)
Lymphs: 37 %
MCH: 30.4 pg (ref 26.6–33.0)
MCHC: 34.2 g/dL (ref 31.5–35.7)
MCV: 89 fL (ref 79–97)
Monocytes Absolute: 0.6 10*3/uL (ref 0.1–0.9)
Monocytes: 7 %
Neutrophils Absolute: 4.4 10*3/uL (ref 1.4–7.0)
Neutrophils: 53 %
Platelets: 240 10*3/uL (ref 150–450)
RBC: 5.19 x10E6/uL (ref 3.77–5.28)
RDW: 13.1 % (ref 11.7–15.4)
WBC: 8.4 10*3/uL (ref 3.4–10.8)

## 2020-07-15 LAB — MAGNESIUM: Magnesium: 1.8 mg/dL (ref 1.6–2.3)

## 2020-07-15 NOTE — Telephone Encounter (Signed)
-----   Message from Richardo Priest, MD sent at 07/15/2020  7:41 AM EDT ----- Good/stable results no changes in treatment

## 2020-07-15 NOTE — Telephone Encounter (Signed)
Spoke with patient regarding results and recommendation.  Patient verbalizes understanding and is agreeable to plan of care. Advised patient to call back with any issues or concerns.  

## 2020-07-16 ENCOUNTER — Other Ambulatory Visit (HOSPITAL_COMMUNITY)
Admission: RE | Admit: 2020-07-16 | Discharge: 2020-07-16 | Disposition: A | Discharge status: Home / Self Care | Payer: 59 | Source: Ambulatory Visit | Attending: Interventional Cardiology | Admitting: Interventional Cardiology

## 2020-07-16 ENCOUNTER — Other Ambulatory Visit (HOSPITAL_COMMUNITY): Payer: 59

## 2020-07-16 DIAGNOSIS — Z20822 Contact with and (suspected) exposure to covid-19: Secondary | ICD-10-CM | POA: Insufficient documentation

## 2020-07-16 DIAGNOSIS — Z01812 Encounter for preprocedural laboratory examination: Secondary | ICD-10-CM | POA: Insufficient documentation

## 2020-07-16 LAB — SARS CORONAVIRUS 2 (TAT 6-24 HRS): SARS Coronavirus 2: NEGATIVE

## 2020-07-17 ENCOUNTER — Telehealth: Payer: Self-pay | Admitting: *Deleted

## 2020-07-17 NOTE — Telephone Encounter (Signed)
Pt contacted pre-catheterization scheduled at [facility] for: Friday July 18, 2020 10:30 AM Verified arrival time and place: [place] [facility]) at: 8:30 AM   No solid food after midnight prior to cath, clear liquids until 5 AM day of procedure.  Hold: Eliquis-none 07/16/20 until post procedure  Metformin-day of procedure and 48 hours post procedure HCTZ-day of procedure  Except hold medications AM meds can be  taken pre-cath with sips of water including: ASA 81 mg   Confirmed patient has responsible adult to drive home post procedure and be with patient first 24 hours after arriving home: yes  You are allowed ONE visitor in the waiting room during the time you are at the hospital for your procedure. Both you and your visitor must wear a mask once you enter the hospital.  Reviewed procedure/mask/visitor instructions with patient.

## 2020-07-18 ENCOUNTER — Other Ambulatory Visit: Payer: Self-pay

## 2020-07-18 ENCOUNTER — Encounter (HOSPITAL_COMMUNITY)
Admission: RE | Disposition: A | Discharge status: Home / Self Care | Payer: 59 | Source: Home / Self Care | Attending: Interventional Cardiology

## 2020-07-18 ENCOUNTER — Ambulatory Visit (HOSPITAL_COMMUNITY)
Admission: RE | Admit: 2020-07-18 | Discharge: 2020-07-18 | Disposition: A | Discharge status: Home / Self Care | Payer: 59 | Attending: Interventional Cardiology | Admitting: Interventional Cardiology

## 2020-07-18 DIAGNOSIS — Z7982 Long term (current) use of aspirin: Secondary | ICD-10-CM | POA: Insufficient documentation

## 2020-07-18 DIAGNOSIS — E782 Mixed hyperlipidemia: Secondary | ICD-10-CM | POA: Insufficient documentation

## 2020-07-18 DIAGNOSIS — Z7901 Long term (current) use of anticoagulants: Secondary | ICD-10-CM | POA: Diagnosis not present

## 2020-07-18 DIAGNOSIS — Z7984 Long term (current) use of oral hypoglycemic drugs: Secondary | ICD-10-CM | POA: Diagnosis not present

## 2020-07-18 DIAGNOSIS — Z6837 Body mass index (BMI) 37.0-37.9, adult: Secondary | ICD-10-CM | POA: Diagnosis not present

## 2020-07-18 DIAGNOSIS — I2510 Atherosclerotic heart disease of native coronary artery without angina pectoris: Secondary | ICD-10-CM | POA: Insufficient documentation

## 2020-07-18 DIAGNOSIS — E669 Obesity, unspecified: Secondary | ICD-10-CM | POA: Diagnosis not present

## 2020-07-18 DIAGNOSIS — R0609 Other forms of dyspnea: Secondary | ICD-10-CM

## 2020-07-18 DIAGNOSIS — R0600 Dyspnea, unspecified: Secondary | ICD-10-CM

## 2020-07-18 DIAGNOSIS — Z87891 Personal history of nicotine dependence: Secondary | ICD-10-CM | POA: Diagnosis not present

## 2020-07-18 DIAGNOSIS — Z79890 Hormone replacement therapy (postmenopausal): Secondary | ICD-10-CM | POA: Diagnosis not present

## 2020-07-18 DIAGNOSIS — E119 Type 2 diabetes mellitus without complications: Secondary | ICD-10-CM | POA: Insufficient documentation

## 2020-07-18 DIAGNOSIS — I10 Essential (primary) hypertension: Secondary | ICD-10-CM | POA: Insufficient documentation

## 2020-07-18 DIAGNOSIS — R079 Chest pain, unspecified: Secondary | ICD-10-CM | POA: Insufficient documentation

## 2020-07-18 DIAGNOSIS — I4811 Longstanding persistent atrial fibrillation: Secondary | ICD-10-CM | POA: Diagnosis not present

## 2020-07-18 DIAGNOSIS — G4733 Obstructive sleep apnea (adult) (pediatric): Secondary | ICD-10-CM | POA: Insufficient documentation

## 2020-07-18 DIAGNOSIS — R0602 Shortness of breath: Secondary | ICD-10-CM | POA: Insufficient documentation

## 2020-07-18 HISTORY — PX: RIGHT/LEFT HEART CATH AND CORONARY ANGIOGRAPHY: CATH118266

## 2020-07-18 LAB — POCT I-STAT EG7
Acid-Base Excess: 0 mmol/L (ref 0.0–2.0)
Bicarbonate: 25.5 mmol/L (ref 20.0–28.0)
Calcium, Ion: 1.2 mmol/L (ref 1.15–1.40)
HCT: 41 % (ref 36.0–46.0)
Hemoglobin: 13.9 g/dL (ref 12.0–15.0)
O2 Saturation: 73 %
Potassium: 3.3 mmol/L — ABNORMAL LOW (ref 3.5–5.1)
Sodium: 142 mmol/L (ref 135–145)
TCO2: 27 mmol/L (ref 22–32)
pCO2, Ven: 44.2 mmHg (ref 44.0–60.0)
pH, Ven: 7.37 (ref 7.250–7.430)
pO2, Ven: 40 mmHg (ref 32.0–45.0)

## 2020-07-18 LAB — POCT I-STAT 7, (LYTES, BLD GAS, ICA,H+H)
Acid-base deficit: 1 mmol/L (ref 0.0–2.0)
Bicarbonate: 23.8 mmol/L (ref 20.0–28.0)
Calcium, Ion: 1.1 mmol/L — ABNORMAL LOW (ref 1.15–1.40)
HCT: 40 % (ref 36.0–46.0)
Hemoglobin: 13.6 g/dL (ref 12.0–15.0)
O2 Saturation: 99 %
Potassium: 3.2 mmol/L — ABNORMAL LOW (ref 3.5–5.1)
Sodium: 143 mmol/L (ref 135–145)
TCO2: 25 mmol/L (ref 22–32)
pCO2 arterial: 39 mmHg (ref 32.0–48.0)
pH, Arterial: 7.393 (ref 7.350–7.450)
pO2, Arterial: 135 mmHg — ABNORMAL HIGH (ref 83.0–108.0)

## 2020-07-18 LAB — GLUCOSE, CAPILLARY: Glucose-Capillary: 147 mg/dL — ABNORMAL HIGH (ref 70–99)

## 2020-07-18 SURGERY — RIGHT/LEFT HEART CATH AND CORONARY ANGIOGRAPHY
Anesthesia: LOCAL

## 2020-07-18 MED ORDER — LIDOCAINE HCL (PF) 1 % IJ SOLN
INTRAMUSCULAR | Status: DC | PRN
  Administered 2020-07-18 (×2): 2 mL via INTRADERMAL

## 2020-07-18 MED ORDER — ACETAMINOPHEN 325 MG PO TABS: 650.0000 mg | ORAL_TABLET | ORAL | Status: DC | PRN

## 2020-07-18 MED ORDER — SODIUM CHLORIDE 0.9% FLUSH: 3.0000 mL | INTRAVENOUS | Status: DC | PRN

## 2020-07-18 MED ORDER — FENTANYL CITRATE (PF) 100 MCG/2ML IJ SOLN
INTRAMUSCULAR | Status: AC
  Filled 2020-07-18: qty 2

## 2020-07-18 MED ORDER — ONDANSETRON HCL 4 MG/2ML IJ SOLN: 4.0000 mg | Freq: Four times a day (QID) | INTRAMUSCULAR | Status: DC | PRN

## 2020-07-18 MED ORDER — MIDAZOLAM HCL 2 MG/2ML IJ SOLN
INTRAMUSCULAR | Status: AC
  Filled 2020-07-18: qty 2

## 2020-07-18 MED ORDER — SODIUM CHLORIDE 0.9% FLUSH: 3.0000 mL | Freq: Two times a day (BID) | INTRAVENOUS | Status: DC

## 2020-07-18 MED ORDER — SODIUM CHLORIDE 0.9 % WEIGHT BASED INFUSION: 1.0000 mL/kg/h | INTRAVENOUS | Status: DC

## 2020-07-18 MED ORDER — MIDAZOLAM HCL 2 MG/2ML IJ SOLN
INTRAMUSCULAR | Status: DC | PRN
  Administered 2020-07-18: 1 mg via INTRAVENOUS
  Administered 2020-07-18: 2 mg via INTRAVENOUS

## 2020-07-18 MED ORDER — VERAPAMIL HCL 2.5 MG/ML IV SOLN
INTRAVENOUS | Status: DC | PRN
  Administered 2020-07-18: 10 mL via INTRA_ARTERIAL

## 2020-07-18 MED ORDER — FENTANYL CITRATE (PF) 100 MCG/2ML IJ SOLN
INTRAMUSCULAR | Status: DC | PRN
  Administered 2020-07-18 (×2): 25 ug via INTRAVENOUS

## 2020-07-18 MED ORDER — HEPARIN (PORCINE) IN NACL 1000-0.9 UT/500ML-% IV SOLN
INTRAVENOUS | Status: AC
  Filled 2020-07-18: qty 1000

## 2020-07-18 MED ORDER — IOHEXOL 350 MG/ML SOLN
INTRAVENOUS | Status: DC | PRN
  Administered 2020-07-18: 50 mL via INTRA_ARTERIAL

## 2020-07-18 MED ORDER — ASPIRIN 81 MG PO CHEW: 81.0000 mg | CHEWABLE_TABLET | ORAL | Status: DC

## 2020-07-18 MED ORDER — HEPARIN SODIUM (PORCINE) 1000 UNIT/ML IJ SOLN
INTRAMUSCULAR | Status: DC | PRN
  Administered 2020-07-18: 5000 [IU] via INTRAVENOUS

## 2020-07-18 MED ORDER — SODIUM CHLORIDE 0.9 % IV SOLN: INTRAVENOUS | Status: AC

## 2020-07-18 MED ORDER — METFORMIN HCL 500 MG PO TABS: 500.0000 mg | ORAL_TABLET | Freq: Two times a day (BID) | ORAL | Status: DC

## 2020-07-18 MED ORDER — SODIUM CHLORIDE 0.9 % WEIGHT BASED INFUSION
3.0000 mL/kg/h | INTRAVENOUS | Status: AC
  Administered 2020-07-18: 3 mL/kg/h via INTRAVENOUS

## 2020-07-18 MED ORDER — SODIUM CHLORIDE 0.9 % IV SOLN: 250.0000 mL | INTRAVENOUS | Status: DC | PRN

## 2020-07-18 MED ORDER — HEPARIN (PORCINE) IN NACL 1000-0.9 UT/500ML-% IV SOLN
INTRAVENOUS | Status: DC | PRN
  Administered 2020-07-18 (×2): 500 mL

## 2020-07-18 MED ORDER — HYDRALAZINE HCL 20 MG/ML IJ SOLN: 10.0000 mg | INTRAMUSCULAR | Status: DC | PRN

## 2020-07-18 MED ORDER — LIDOCAINE HCL (PF) 1 % IJ SOLN
INTRAMUSCULAR | Status: AC
  Filled 2020-07-18: qty 30

## 2020-07-18 MED ORDER — VERAPAMIL HCL 2.5 MG/ML IV SOLN
INTRAVENOUS | Status: AC
  Filled 2020-07-18: qty 2

## 2020-07-18 MED ORDER — HEPARIN SODIUM (PORCINE) 1000 UNIT/ML IJ SOLN
INTRAMUSCULAR | Status: AC
  Filled 2020-07-18: qty 1

## 2020-07-18 MED ORDER — APIXABAN 5 MG PO TABS: 5.0000 mg | ORAL_TABLET | Freq: Two times a day (BID) | ORAL | 13 refills | Status: DC

## 2020-07-18 MED ORDER — LABETALOL HCL 5 MG/ML IV SOLN: 10.0000 mg | INTRAVENOUS | Status: DC | PRN

## 2020-07-18 SURGICAL SUPPLY — 11 items
CATH 5FR JL3.5 JR4 ANG PIG MP (CATHETERS) ×2 IMPLANT
CATH BALLN WEDGE 5F 110CM (CATHETERS) ×2 IMPLANT
DEVICE RAD COMP TR BAND LRG (VASCULAR PRODUCTS) ×2 IMPLANT
GLIDESHEATH SLEND SS 6F .021 (SHEATH) ×2 IMPLANT
GUIDEWIRE INQWIRE 1.5J.035X260 (WIRE) ×2 IMPLANT
INQWIRE 1.5J .035X260CM (WIRE) ×4
KIT HEART LEFT (KITS) ×2 IMPLANT
PACK CARDIAC CATHETERIZATION (CUSTOM PROCEDURE TRAY) ×2 IMPLANT
SHEATH GLIDE SLENDER 4/5FR (SHEATH) ×2 IMPLANT
TRANSDUCER W/STOPCOCK (MISCELLANEOUS) ×2 IMPLANT
TUBING CIL FLEX 10 FLL-RA (TUBING) ×2 IMPLANT

## 2020-07-18 NOTE — Progress Notes (Signed)
Discharge instructions reviewed with patient and daughter. Verbalized understanding.  

## 2020-07-18 NOTE — Interval H&P Note (Signed)
Cath Lab Visit (complete for each Cath Lab visit)  Clinical Evaluation Leading to the Procedure:   ACS: No.  Non-ACS:    Anginal Classification: CCS III  Anti-ischemic medical therapy: Minimal Therapy (1 class of medications)  Non-Invasive Test Results: Low-risk stress test findings: cardiac mortality <1%/year  Prior CABG: No previous CABG      History and Physical Interval Note:  07/18/2020 11:23 AM  Melissa Boyer  has presented today for surgery, with the diagnosis of chest pain, shortness of breath.  The various methods of treatment have been discussed with the patient and family. After consideration of risks, benefits and other options for treatment, the patient has consented to  Procedure(s): RIGHT/LEFT HEART CATH AND CORONARY ANGIOGRAPHY (N/A) as a surgical intervention.  The patient's history has been reviewed, patient examined, no change in status, stable for surgery.  I have reviewed the patient's chart and labs.  Questions were answered to the patient's satisfaction.     Larae Grooms

## 2020-07-18 NOTE — Discharge Instructions (Signed)
Radial Site Care  This sheet gives you information about how to care for yourself after your procedure. Your health care provider may also give you more specific instructions. If you have problems or questions, contact your health care provider. What can I expect after the procedure? After the procedure, it is common to have:  Bruising and tenderness at the catheter insertion area. Follow these instructions at home: Medicines  Take over-the-counter and prescription medicines only as told by your health care provider. Insertion site care  Follow instructions from your health care provider about how to take care of your insertion site. Make sure you: ? Wash your hands with soap and water before you change your bandage (dressing). If soap and water are not available, use hand sanitizer. ? Change your dressing as told by your health care provider. ? Leave stitches (sutures), skin glue, or adhesive strips in place. These skin closures may need to stay in place for 2 weeks or longer. If adhesive strip edges start to loosen and curl up, you may trim the loose edges. Do not remove adhesive strips completely unless your health care provider tells you to do that.  Check your insertion site every day for signs of infection. Check for: ? Redness, swelling, or pain. ? Fluid or blood. ? Pus or a bad smell. ? Warmth.  Do not take baths, swim, or use a hot tub until your health care provider approves.  You may shower 24-48 hours after the procedure, or as directed by your health care provider. ? Remove the dressing and gently wash the site with plain soap and water. ? Pat the area dry with a clean towel. ? Do not rub the site. That could cause bleeding.  Do not apply powder or lotion to the site. Activity  For 24 hours after the procedure, or as directed by your health care provider: ? Do not flex or bend the affected arm. ? Do not push or pull heavy objects with the affected arm. ? Do not drive  yourself home from the hospital or clinic. You may drive 24 hours after the procedure unless your health care provider tells you not to. ? Do not operate machinery or power tools.  Do not lift anything that is heavier than 10 lb (4.5 kg), or the limit that you are told, until your health care provider says that it is safe.  Ask your health care provider when it is okay to: ? Return to work or school. ? Resume usual physical activities or sports. ? Resume sexual activity.   General instructions  If the catheter site starts to bleed, raise your arm and put firm pressure on the site. If the bleeding does not stop, get help right away. This is a medical emergency.  If you went home on the same day as your procedure, a responsible adult should be with you for the first 24 hours after you arrive home.  Keep all follow-up visits as told by your health care provider. This is important. Contact a health care provider if:  You have a fever.  You have redness, swelling, or yellow drainage around your insertion site. Get help right away if:  You have unusual pain at the radial site.  The catheter insertion area swells very fast.  The insertion area is bleeding, and the bleeding does not stop when you hold steady pressure on the area.  Your arm or hand becomes pale, cool, tingly, or numb. These symptoms may represent a serious   problem that is an emergency. Do not wait to see if the symptoms will go away. Get medical help right away. Call your local emergency services (911 in the [place] Do not drive yourself to the hospital. Summary  After the procedure, it is common to have bruising and tenderness at the site.  Follow instructions from your health care provider about how to take care of your radial site wound. Check the wound every day for signs of infection.  Do not lift anything that is heavier than 10 lb (4.5 kg), or the limit that you are told, until your health care provider says that it  is safe. This information is not intended to replace advice given to you by your health care provider. Make sure you discuss any questions you have with your health care provider. Document Revised: 05/25/2017 Document Reviewed: 05/25/2017 Elsevier Patient Education  2021 [employer]  

## 2020-07-21 ENCOUNTER — Encounter (HOSPITAL_COMMUNITY): Payer: Self-pay | Admitting: Interventional Cardiology

## 2020-07-28 ENCOUNTER — Ambulatory Visit (INDEPENDENT_AMBULATORY_CARE_PROVIDER_SITE_OTHER): Payer: Medicare Other

## 2020-07-28 ENCOUNTER — Other Ambulatory Visit: Payer: Self-pay

## 2020-07-28 DIAGNOSIS — R0789 Other chest pain: Secondary | ICD-10-CM

## 2020-07-28 DIAGNOSIS — Z87891 Personal history of nicotine dependence: Secondary | ICD-10-CM | POA: Diagnosis not present

## 2020-07-28 DIAGNOSIS — R0602 Shortness of breath: Secondary | ICD-10-CM | POA: Diagnosis not present

## 2020-07-28 DIAGNOSIS — I10 Essential (primary) hypertension: Secondary | ICD-10-CM

## 2020-07-28 DIAGNOSIS — I4811 Longstanding persistent atrial fibrillation: Secondary | ICD-10-CM

## 2020-07-28 LAB — ECHOCARDIOGRAM COMPLETE
Area-P 1/2: 2.36 cm2
S' Lateral: 3.4 cm

## 2020-07-28 NOTE — Progress Notes (Signed)
Complete echocardiogram performed.  Jimmy Joy Haegele RDCS, RVT  

## 2020-08-04 DIAGNOSIS — E7800 Pure hypercholesterolemia, unspecified: Secondary | ICD-10-CM | POA: Insufficient documentation

## 2020-08-04 DIAGNOSIS — Z9189 Other specified personal risk factors, not elsewhere classified: Secondary | ICD-10-CM | POA: Insufficient documentation

## 2020-08-04 DIAGNOSIS — E119 Type 2 diabetes mellitus without complications: Secondary | ICD-10-CM | POA: Insufficient documentation

## 2020-08-04 DIAGNOSIS — D219 Benign neoplasm of connective and other soft tissue, unspecified: Secondary | ICD-10-CM | POA: Insufficient documentation

## 2020-08-04 DIAGNOSIS — E079 Disorder of thyroid, unspecified: Secondary | ICD-10-CM | POA: Insufficient documentation

## 2020-08-04 DIAGNOSIS — I10 Essential (primary) hypertension: Secondary | ICD-10-CM | POA: Insufficient documentation

## 2020-08-06 ENCOUNTER — Ambulatory Visit (INDEPENDENT_AMBULATORY_CARE_PROVIDER_SITE_OTHER): Payer: 59

## 2020-08-06 ENCOUNTER — Encounter: Payer: Self-pay | Admitting: Cardiology

## 2020-08-06 ENCOUNTER — Ambulatory Visit (INDEPENDENT_AMBULATORY_CARE_PROVIDER_SITE_OTHER): Payer: 59 | Admitting: Cardiology

## 2020-08-06 ENCOUNTER — Other Ambulatory Visit: Payer: Self-pay

## 2020-08-06 VITALS — BP 122/88 | HR 72 | Ht 66.0 in | Wt 240.8 lb

## 2020-08-06 DIAGNOSIS — I2510 Atherosclerotic heart disease of native coronary artery without angina pectoris: Secondary | ICD-10-CM

## 2020-08-06 DIAGNOSIS — I480 Paroxysmal atrial fibrillation: Secondary | ICD-10-CM | POA: Diagnosis not present

## 2020-08-06 DIAGNOSIS — I10 Essential (primary) hypertension: Secondary | ICD-10-CM

## 2020-08-06 DIAGNOSIS — G4733 Obstructive sleep apnea (adult) (pediatric): Secondary | ICD-10-CM | POA: Diagnosis not present

## 2020-08-06 NOTE — Patient Instructions (Signed)
Medication Instructions:  Your physician recommends that you continue on your current medications as directed. Please refer to the Current Medication list given to you today.  *If you need a refill on your cardiac medications before your next appointment, please call your pharmacy*   Lab Work: None If you have labs (blood work) drawn today and your tests are completely normal, you will receive your results only by: Marland Kitchen MyChart Message (if you have MyChart) OR . A paper copy in the mail If you have any lab test that is abnormal or we need to change your treatment, we will call you to review the results.   Testing/Procedures: A zio monitor was ordered today. It will remain on for 14 days. You will then return monitor and event diary in provided box. It takes 1-2 weeks for report to be downloaded and returned to [place]. We will call you with the results. If monitor falls off or has orange flashing light, please call Zio for further instructions.    Follow-Up: At [facility], you and your health needs are our priority.  As part of our continuing mission to provide you with exceptional heart care, we have created designated Provider Care Teams.  These Care Teams include your primary Cardiologist (physician) and Advanced Practice Providers (APPs -  Physician Assistants and Nurse Practitioners) who all work together to provide you with the care you need, when you need it.  We recommend signing up for the patient portal called "MyChart".  Sign up information is provided on this After Visit Summary.  MyChart is used to connect with patients for Virtual Visits (Telemedicine).  Patients are able to view lab/test results, encounter notes, upcoming appointments, etc.  Non-urgent messages can be sent to your provider as well.   To learn more about what you can do with MyChart, go to [email or web address].    Your next appointment:   8 week(s)  The format for your next appointment:   In  Person  Provider:   Berniece Salines, DO   Other Instructions

## 2020-08-06 NOTE — Progress Notes (Signed)
Cardiology Office Note:    Date:  08/06/2020   ID:  Melissa Boyer, DOB Jun 15, 1955, MRN [id number]  PCP:  Cher Nakai, MD  Cardiologist:  Berniece Salines, DO  Electrophysiologist:  None   Referring MD: Cher Nakai, MD   I am still having palpiations  History of Present Illness:    Melissa Boyer is a 65 y.o. female with a hx of diabetes mellitus, hyperlipidemia, atrial fibrillation on Eliquis which was diagnosed in 2019, apnea on CPAP, hypothyroidism, former smoker, coronary artery disease nonobstructive per her recent cardiac catheterization.  At her last visit she was experiencing some shortness of breath she was in atrial fibrillation we talked about the different treatments of what needed to be proceeded with.  We will send her for heart catheterization which showed nonobstructive CAD.  I will place a monitor the patient to see if she has any significant A. fib burden.  She may be a good patient for TEE cardioversion and ultimately  Past Medical History:  Diagnosis Date  . Abnormal weight gain 07/10/2020  . Atrial fibrillation ([place])   . Change in bowel habit 07/10/2020  . DES exposure in utero   . DM2 (diabetes mellitus, type 2) ([place])   . Essential hypertension 01/01/2014  . Family history of breast cancer in first degree relative 01/01/2014  . Family history of colonic polyps 07/10/2020  . Fibroids   . Hepatitis C 11/99   2 yr tx, "remission", told Ag neg  . History of diabetes mellitus   . 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 Hypercholesteremia   . Hypertension   . Left renal mass 12/29/2019  . Mixed hyperlipidemia 02/27/2019  . Obesity (BMI 35.0-39.9 without comorbidity) 12/29/2019  . Obstructive sleep apnea on CPAP 06/19/2019  . Paroxysmal atrial fibrillation ([place]) 02/27/2019  . Pure hypercholesterolemia 01/01/2014  . Thyroid disease    hypothyroid  . Unspecified hypothyroidism 01/01/2014    Past Surgical History:  Procedure Laterality Date  .  COLPOSCOPY  10/98   normal, no biopsy  . ENDOMETRIAL ABLATION  12/02/2002  . ENDOMETRIAL BIOPSY  5/03  . RIGHT/LEFT HEART CATH AND CORONARY ANGIOGRAPHY N/A 07/18/2020   Procedure: RIGHT/LEFT HEART CATH AND CORONARY ANGIOGRAPHY;  Surgeon: Jettie Booze, MD;  Location: [facility];  Service: Cardiovascular;  Laterality: N/A;  . THYROIDECTOMY, PARTIAL     thyroid nodule excision left side    Current Medications: Current Meds  Medication Sig  . ALPRAZolam (XANAX) 0.5 MG tablet Take 0.5 mg by mouth 2 (two) times daily as needed for anxiety.  Marland Kitchen amLODipine (NORVASC) 5 MG tablet Take 2.5 mg by mouth at bedtime.  Marland Kitchen apixaban (ELIQUIS) 5 MG TABS tablet Take 1 tablet (5 mg total) by mouth 2 (two) times daily.  Marland Kitchen aspirin EC 81 MG tablet Take 1 tablet (81 mg total) by mouth daily. Swallow whole.  Marland Kitchen atorvastatin (LIPITOR) 20 MG tablet Take 20 mg by mouth daily.  . hydrochlorothiazide (HYDRODIURIL) 12.5 MG tablet Take 12.5 mg by mouth daily.  Marland Kitchen levothyroxine (SYNTHROID, LEVOTHROID) 125 MCG tablet Take 125 mcg by mouth daily before breakfast.  . losartan (COZAAR) 100 MG tablet Take 100 mg by mouth daily.  . meloxicam (MOBIC) 7.5 MG tablet Take 7.5 mg by mouth every other day.  . metFORMIN (GLUCOPHAGE) 500 MG tablet Take 1 tablet (500 mg total) by mouth 2 (two) times daily with a meal.  . metoprolol succinate (TOPROL-XL) 100 MG 24 hr tablet  Take 100 mg by mouth daily.  . nitroGLYCERIN (NITROSTAT) 0.4 MG SL tablet Place 1 tablet (0.4 mg total) under the tongue every 5 (five) minutes as needed for chest pain.  Marland Kitchen triazolam (HALCION) 0.25 MG tablet Take 0.25 mg by mouth at bedtime.  . TRULICITY 1.5 ZO/1.0RU SOPN Inject 1.5 mg into the skin once a week.     Allergies:   Patient has no known allergies.   Social History   Socioeconomic History  . Marital status: Divorced    Spouse name: Not on file  . Number of children: 1  . Years of education: Not on file  . Highest education level: Not on  file  Occupational History  . Not on file  Tobacco Use  . Smoking status: Former Smoker    Quit date: 07/11/2005    Years since quitting: 15.0  . Smokeless tobacco: Never Used  Vaping Use  . Vaping Use: Never used  Substance and Sexual Activity  . Alcohol use: Never  . Drug use: No  . Sexual activity: Yes    Partners: Male    Birth control/protection: Post-menopausal  Other Topics Concern  . Not on file  Social History Narrative  . Not on file   Social Determinants of Health   Financial Resource Strain: Not on file  Food Insecurity: Not on file  Transportation Needs: Not on file  Physical Activity: Not on file  Stress: Not on file  Social Connections: Not on file     Family History: The patient's family history includes Cancer in her maternal aunt, mother, and sister; Diabetes in her maternal grandfather; Emphysema in her father; Heart attack in her maternal aunt and maternal grandmother; Hypertension in her father.  ROS:   Review of Systems  Constitution: Negative for decreased appetite, fever and weight gain.  HENT: Negative for congestion, ear discharge, hoarse voice and sore throat.   Eyes: Negative for discharge, redness, vision loss in right eye and visual halos.  Cardiovascular: Negative for chest pain, dyspnea on exertion, leg swelling, orthopnea and palpitations.  Respiratory: Negative for cough, hemoptysis, shortness of breath and snoring.   Endocrine: Negative for heat intolerance and polyphagia.  Hematologic/Lymphatic: Negative for bleeding problem. Does not bruise/bleed easily.  Skin: Negative for flushing, nail changes, rash and suspicious lesions.  Musculoskeletal: Negative for arthritis, joint pain, muscle cramps, myalgias, neck pain and stiffness.  Gastrointestinal: Negative for abdominal pain, bowel incontinence, diarrhea and excessive appetite.  Genitourinary: Negative for decreased libido, genital sores and incomplete emptying.  Neurological: Negative  for brief paralysis, focal weakness, headaches and loss of balance.  Psychiatric/Behavioral: Negative for altered mental status, depression and suicidal ideas.  Allergic/Immunologic: Negative for HIV exposure and persistent infections.    EKGs/Labs/Other Studies Reviewed:    The following studies were reviewed today:   EKG: None today   Transthoracic echocardiogram March 28,2022 IMPRESSIONS  1. Left ventricular ejection fraction, by estimation, is 60 to 65%. The  left ventricle has normal function. The left ventricle has no regional  wall motion abnormalities. Left ventricular diastolic parameters are  indeterminate.  2. Right ventricular systolic function is normal. The right ventricular  size is normal. There is normal pulmonary artery systolic pressure.  3. Left atrial size was mild to moderately dilated.  4. The mitral valve is normal in structure. No evidence of mitral valve  regurgitation. No evidence of mitral stenosis.  5. The aortic valve is normal in structure. Aortic valve regurgitation is  not visualized. No aortic stenosis  is present.  6. There is mild dilatation of the ascending aorta, measuring 38 mm.  7. The inferior vena cava is normal in size with greater than 50%  respiratory variability, suggesting right atrial pressure of 3 mmHg.   Left heart catheterization 07/18/2020  Ost Cx lesion is 25% stenosed.  Mid LAD lesion is 25% stenosed.  The left ventricular systolic function is normal.  LV end diastolic pressure is mildly elevated.  The left ventricular ejection fraction is 55-65% by visual estimate.  There is no aortic valve stenosis.  Ao sat 99%, PA sat 73%, PA pressure 32/12, mean PA 25 mm Hg; mean PCWP 20 mm Hg; CO 5.1 L/min; CI 2.38 Continue preventive therapy.  Resume Eliquis tomorrow. Resume metformin on 07/20/20.  Further plan per Dr. Harriet Masson.    Recent Labs: 07/14/2020: BUN 14; Creatinine, Ser 0.84; Magnesium 1.8; Platelets 240 07/18/2020:  Hemoglobin 13.9; Potassium 3.3; Sodium 142  Recent Lipid Panel No results found for: CHOL, TRIG, HDL, CHOLHDL, VLDL, LDLCALC, LDLDIRECT  Physical Exam:    VS:  BP 122/88   Pulse 72   Ht 5\' 6"  (1.676 m)   Wt 240 lb 12.8 oz (109.2 kg)   LMP 12/02/2002 (Approximate)   SpO2 98%   BMI 38.87 kg/m     Wt Readings from Last 3 Encounters:  08/06/20 240 lb 12.8 oz (109.2 kg)  07/18/20 238 lb (108 kg)  07/11/20 241 lb (109.3 kg)     GEN: Well nourished, well developed in no acute distress HEENT: Normal NECK: No JVD; No carotid bruits LYMPHATICS: No lymphadenopathy CARDIAC: S1S2 noted,RRR, no murmurs, rubs, gallops RESPIRATORY:  Clear to auscultation without rales, wheezing or rhonchi  ABDOMEN: Soft, non-tender, non-distended, +bowel sounds, no guarding. EXTREMITIES: No edema, No cyanosis, no clubbing MUSCULOSKELETAL:  No deformity  SKIN: Warm and dry NEUROLOGIC:  Alert and oriented x 3, non-focal PSYCHIATRIC:  Normal affect, good insight  ASSESSMENT:    1. PAF (paroxysmal atrial fibrillation) ([place])   2. OSA (obstructive sleep apnea)   3. Coronary artery disease involving native coronary artery of native heart, unspecified whether angina present   4. Hypertension, unspecified type    PLAN:     She is having intermittent palpitations and we will keep the patient the current dose of metoprolol placed on a ZIO monitor to understand her atrial fibrillation burden.  We will continue the anticoagulation as well.  I am hoping that we can understand this and have the patient referred to EP for possible evaluation for ablation.  In the meantime we will consider TEE/ cardioversion as well. CAD, no angina symptoms continue current medication management.  She has OSA and is on cpap but I think she needs to be reassessed as her CPAP is about 65 years old.  I referred the patient to pulmonary sleep study for pulmonary function test as well as reconsideration for CPAP adjustment.  Blood  pressure is acceptable, continue with current antihypertensive regimen.  The patient is in agreement with the above plan. The patient left the office in stable condition.  The patient will follow up in 8 weeks or sooner if needed.   Medication Adjustments/Labs and Tests Ordered: Current medicines are reviewed at length with the patient today.  Concerns regarding medicines are outlined above.  Orders Placed This Encounter  Procedures  . Ambulatory referral to Pulmonology  . LONG TERM MONITOR (3-14 DAYS)   No orders of the defined types were placed in this encounter.   Patient Instructions  Medication  Instructions:  Your physician recommends that you continue on your current medications as directed. Please refer to the Current Medication list given to you today.  *If you need a refill on your cardiac medications before your next appointment, please call your pharmacy*   Lab Work: None If you have labs (blood work) drawn today and your tests are completely normal, you will receive your results only by: Marland Kitchen MyChart Message (if you have MyChart) OR . A paper copy in the mail If you have any lab test that is abnormal or we need to change your treatment, we will call you to review the results.   Testing/Procedures: A zio monitor was ordered today. It will remain on for 14 days. You will then return monitor and event diary in provided box. It takes 1-2 weeks for report to be downloaded and returned to [place]. We will call you with the results. If monitor falls off or has orange flashing light, please call Zio for further instructions.    Follow-Up: At [facility], you and your health needs are our priority.  As part of our continuing mission to provide you with exceptional heart care, we have created designated Provider Care Teams.  These Care Teams include your primary Cardiologist (physician) and Advanced Practice Providers (APPs -  Physician Assistants and Nurse Practitioners) who all work  together to provide you with the care you need, when you need it.  We recommend signing up for the patient portal called "MyChart".  Sign up information is provided on this After Visit Summary.  MyChart is used to connect with patients for Virtual Visits (Telemedicine).  Patients are able to view lab/test results, encounter notes, upcoming appointments, etc.  Non-urgent messages can be sent to your provider as well.   To learn more about what you can do with MyChart, go to [email or web address].    Your next appointment:   8 week(s)  The format for your next appointment:   In Person  Provider:   Berniece Salines, DO   Other Instructions      Adopting a Healthy Lifestyle.  Know what a healthy weight is for you (roughly BMI <25) and aim to maintain this   Aim for 7+ servings of fruits and vegetables daily   65-80+ fluid ounces of water or unsweet tea for healthy kidneys   Limit to max 1 drink of alcohol per day; avoid smoking/tobacco   Limit animal fats in diet for cholesterol and heart health - choose grass fed whenever available   Avoid highly processed foods, and foods high in saturated/trans fats   Aim for low stress - take time to unwind and care for your mental health   Aim for 150 min of moderate intensity exercise weekly for heart health, and weights twice weekly for bone health   Aim for 7-9 hours of sleep daily   When it comes to diets, agreement about the perfect plan isnt easy to find, even among the experts. Experts at the [employer] developed an idea known as the Healthy Eating Plate. Just imagine a plate divided into logical, healthy portions.   The emphasis is on diet quality:   Load up on vegetables and fruits - one-half of your plate: Aim for color and variety, and remember that potatoes dont count.   Go for whole grains - one-quarter of your plate: Whole wheat, barley, wheat berries, quinoa, oats, brown rice, and foods made with  them. If you want pasta, go with whole  wheat pasta.   Protein power - one-quarter of your plate: Fish, chicken, beans, and nuts are all healthy, versatile protein sources. Limit red meat.   The diet, however, does go beyond the plate, offering a few other suggestions.   Use healthy plant oils, such as olive, canola, soy, corn, sunflower and peanut. Check the labels, and avoid partially hydrogenated oil, which have unhealthy trans fats.   If youre thirsty, drink water. Coffee and tea are good in moderation, but skip sugary drinks and limit milk and dairy products to one or two daily servings.   The type of carbohydrate in the diet is more important than the amount. Some sources of carbohydrates, such as vegetables, fruits, whole grains, and beans-are healthier than others.   Finally, stay active  Signed, Berniece Salines, DO  08/06/2020 5:26 PM    [facility]

## 2020-09-16 ENCOUNTER — Encounter: Payer: Self-pay | Admitting: Pulmonary Disease

## 2020-09-16 ENCOUNTER — Other Ambulatory Visit: Payer: Self-pay

## 2020-09-16 ENCOUNTER — Ambulatory Visit (INDEPENDENT_AMBULATORY_CARE_PROVIDER_SITE_OTHER): Payer: 59 | Admitting: Pulmonary Disease

## 2020-09-16 VITALS — BP 136/82 | HR 64 | Temp 98.6°F | Ht 66.0 in | Wt 240.8 lb

## 2020-09-16 DIAGNOSIS — R0683 Snoring: Secondary | ICD-10-CM

## 2020-09-16 NOTE — Patient Instructions (Signed)
Schedule you for home sleep study  We will update you with results as soon as reviewed  Tentative follow-up in 3 to 4 months  Call with significant concerns Sleep Apnea Sleep apnea affects breathing during sleep. It causes breathing to stop for a short time or to become shallow. It can also increase the risk of:  Heart attack.  Stroke.  Being very overweight (obese).  Diabetes.  Heart failure.  Irregular heartbeat. The goal of treatment is to help you breathe normally again. What are the causes? There are three kinds of sleep apnea:  Obstructive sleep apnea. This is caused by a blocked or collapsed airway.  Central sleep apnea. This happens when the brain does not send the right signals to the muscles that control breathing.  Mixed sleep apnea. This is a combination of obstructive and central sleep apnea. The most common cause of this condition is a collapsed or blocked airway. This can happen if:  Your throat muscles are too relaxed.  Your tongue and tonsils are too large.  You are overweight.  Your airway is too small.   What increases the risk?  Being overweight.  Smoking.  Having a small airway.  Being older.  Being female.  Drinking alcohol.  Taking medicines to calm yourself (sedatives or tranquilizers).  Having family members with the condition. What are the signs or symptoms?  Trouble staying asleep.  Being sleepy or tired during the day.  Getting angry a lot.  Loud snoring.  Headaches in the morning.  Not being able to focus your mind (concentrate).  Forgetting things.  Less interest in sex.  Mood swings.  Personality changes.  Feelings of sadness (depression).  Waking up a lot during the night to pee (urinate).  Dry mouth.  Sore throat. How is this diagnosed?  Your medical history.  A physical exam.  A test that is done when you are sleeping (sleep study). The test is most often done in a sleep lab but may also be done  at home. How is this treated?  Sleeping on your side.  Using a medicine to get rid of mucus in your nose (decongestant).  Avoiding the use of alcohol, medicines to help you relax, or certain pain medicines (narcotics).  Losing weight, if needed.  Changing your diet.  Not smoking.  Using a machine to open your airway while you sleep, such as: ? An oral appliance. This is a mouthpiece that shifts your lower jaw forward. ? A CPAP device. This device blows air through a mask when you breathe out (exhale). ? An EPAP device. This has valves that you put in each nostril. ? A BPAP device. This device blows air through a mask when you breathe in (inhale) and breathe out.  Having surgery if other treatments do not work. It is important to get treatment for sleep apnea. Without treatment, it can lead to:  High blood pressure.  Coronary artery disease.  In men, not being able to have an erection (impotence).  Reduced thinking ability.   Follow these instructions at home: Lifestyle  Make changes that your doctor recommends.  Eat a healthy diet.  Lose weight if needed.  Avoid alcohol, medicines to help you relax, and some pain medicines.  Do not use any products that contain nicotine or tobacco, such as cigarettes, e-cigarettes, and chewing tobacco. If you need help quitting, ask your doctor. General instructions  Take over-the-counter and prescription medicines only as told by your doctor.  If you were  given a machine to use while you sleep, use it only as told by your doctor.  If you are having surgery, make sure to tell your doctor you have sleep apnea. You may need to bring your device with you.  Keep all follow-up visits as told by your doctor. This is important. Contact a doctor if:  The machine that you were given to use during sleep bothers you or does not seem to be working.  You do not get better.  You get worse. Get help right away if:  Your chest  hurts.  You have trouble breathing in enough air.  You have an uncomfortable feeling in your back, arms, or stomach.  You have trouble talking.  One side of your body feels weak.  A part of your face is hanging down. These symptoms may be an emergency. Do not wait to see if the symptoms will go away. Get medical help right away. Call your local emergency services (911 in the [place] Do not drive yourself to the hospital. Summary  This condition affects breathing during sleep.  The most common cause is a collapsed or blocked airway.  The goal of treatment is to help you breathe normally while you sleep. This information is not intended to replace advice given to you by your health care provider. Make sure you discuss any questions you have with your health care provider. Document Revised: 02/03/2018 Document Reviewed: 12/13/2017 Elsevier Patient Education  [employer].

## 2020-09-16 NOTE — Progress Notes (Signed)
Melissa Boyer    [id number]    07-Dec-1955  Primary Care Physician:Lee, Joylene Igo, MD  Referring Physician: Cher Nakai, MD [place],  [place] [address]  Chief complaint:   Patient with a history of obstructive sleep apnea  HPI:  Current machine is dated Download not obtainable  The machine alarms through the night Not able to keep the machine on at night  Snores with and without the mask Just does not does not feel comfortable on the machine  Last study was in 2019, she does not recollect how severe sleep apnea was  Usually tries to go to bed between 930 and 10 Falls asleep pretty quickly 2-3 awakenings Final wake up time 5:30 AM Weight is up about 20 pounds  Admits to dryness of her mouth in the mornings No headaches Sleep is usually not restorative  She does have a history of atrial fibrillation, hypercholesterolemia, allergy and sinus problems, hypertension  Reformed smoker quit in 2005   Outpatient Encounter Medications as of 09/16/2020  Medication Sig  . ALPRAZolam (XANAX) 0.5 MG tablet Take 0.5 mg by mouth 2 (two) times daily as needed for anxiety.  Marland Kitchen amLODipine (NORVASC) 5 MG tablet Take 2.5 mg by mouth at bedtime.  Marland Kitchen apixaban (ELIQUIS) 5 MG TABS tablet Take 1 tablet (5 mg total) by mouth 2 (two) times daily.  Marland Kitchen aspirin EC 81 MG tablet Take 1 tablet (81 mg total) by mouth daily. Swallow whole.  Marland Kitchen atorvastatin (LIPITOR) 20 MG tablet Take 20 mg by mouth daily.  . hydrochlorothiazide (HYDRODIURIL) 12.5 MG tablet Take 12.5 mg by mouth daily.  Marland Kitchen levothyroxine (SYNTHROID, LEVOTHROID) 125 MCG tablet Take 125 mcg by mouth daily before breakfast.  . losartan (COZAAR) 100 MG tablet Take 100 mg by mouth daily.  . meloxicam (MOBIC) 7.5 MG tablet Take 7.5 mg by mouth every other day.  . metFORMIN (GLUCOPHAGE) 500 MG tablet Take 1 tablet (500 mg total) by mouth 2 (two) times daily with a meal.  . metoprolol succinate (TOPROL-XL) 100 MG 24  hr tablet Take 100 mg by mouth daily.  . nitroGLYCERIN (NITROSTAT) 0.4 MG SL tablet Place 1 tablet (0.4 mg total) under the tongue every 5 (five) minutes as needed for chest pain.  Marland Kitchen triazolam (HALCION) 0.25 MG tablet Take 0.25 mg by mouth at bedtime.  . TRULICITY 1.5 WN/0.2VO SOPN Inject 1.5 mg into the skin once a week. (Patient not taking: Reported on 09/16/2020)   No facility-administered encounter medications on file as of 09/16/2020.    Allergies as of 09/16/2020  . (No Known Allergies)    Past Medical History:  Diagnosis Date  . Abnormal weight gain 07/10/2020  . Atrial fibrillation ([place])   . Change in bowel habit 07/10/2020  . DES exposure in utero   . DM2 (diabetes mellitus, type 2) ([place])   . Essential hypertension 01/01/2014  . Family history of breast cancer in first degree relative 01/01/2014  . Family history of colonic polyps 07/10/2020  . Fibroids   . Hepatitis C 11/99   2 yr tx, "remission", told Ag neg  . History of diabetes mellitus   . 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 Hypercholesteremia   . Hypertension   . Left renal mass 12/29/2019  . Mixed hyperlipidemia 02/27/2019  . Obesity (BMI 35.0-39.9 without comorbidity) 12/29/2019  . Obstructive sleep apnea on CPAP 06/19/2019  . Paroxysmal atrial fibrillation ([facility])  02/27/2019  . Pure hypercholesterolemia 01/01/2014  . Thyroid disease    hypothyroid  . Unspecified hypothyroidism 01/01/2014    Past Surgical History:  Procedure Laterality Date  . COLPOSCOPY  10/98   normal, no biopsy  . ENDOMETRIAL ABLATION  12/02/2002  . ENDOMETRIAL BIOPSY  5/03  . RIGHT/LEFT HEART CATH AND CORONARY ANGIOGRAPHY N/A 07/18/2020   Procedure: RIGHT/LEFT HEART CATH AND CORONARY ANGIOGRAPHY;  Surgeon: Jettie Booze, MD;  Location: [facility];  Service: Cardiovascular;  Laterality: N/A;  . THYROIDECTOMY, PARTIAL     thyroid nodule excision left side    Family History  Problem Relation Age of Onset   . Cancer Mother        lung,   . Hypertension Father   . Emphysema Father   . Cancer Sister        tongue  . Cancer Maternal Aunt        colon, cervical, throat  . Heart attack Maternal Aunt   . Diabetes Maternal Grandfather   . Heart attack Maternal Grandmother     Social History   Socioeconomic History  . Marital status: Divorced    Spouse name: Not on file  . Number of children: 1  . Years of education: Not on file  . Highest education level: Not on file  Occupational History  . Not on file  Tobacco Use  . Smoking status: Former Smoker    Packs/day: 1.00    Years: 30.00    Pack years: 30.00    Types: Cigarettes    Quit date: 07/12/2003    Years since quitting: 17.1  . Smokeless tobacco: Never Used  Vaping Use  . Vaping Use: Never used  Substance and Sexual Activity  . Alcohol use: Never  . Drug use: No  . Sexual activity: Yes    Partners: Male    Birth control/protection: Post-menopausal  Other Topics Concern  . Not on file  Social History Narrative  . Not on file   Social Determinants of Health   Financial Resource Strain: Not on file  Food Insecurity: Not on file  Transportation Needs: Not on file  Physical Activity: Not on file  Stress: Not on file  Social Connections: Not on file  Intimate Partner Violence: Not on file    Review of Systems  Constitutional: Positive for fatigue.  Respiratory: Positive for apnea.   Psychiatric/Behavioral: Positive for sleep disturbance.    Vitals:   09/16/20 1611  BP: 136/82  Pulse: 64  Temp: 98.6 F (37 C)  SpO2: 97%     Physical Exam Constitutional:      Appearance: She is obese.  HENT:     Head: Normocephalic.     Nose: Nose normal.     Mouth/Throat:     Mouth: Mucous membranes are moist.     Comments: Mallampati 4, crowded oropharynx, macroglossia Eyes:     General:        Right eye: No discharge.     Pupils: Pupils are equal, round, and reactive to light.  Cardiovascular:     Rate and  Rhythm: Normal rate and regular rhythm.     Heart sounds: No murmur heard. No friction rub.  Pulmonary:     Effort: No respiratory distress.     Breath sounds: No stridor. No wheezing or rhonchi.  Musculoskeletal:     Cervical back: No rigidity or tenderness.  Neurological:     Mental Status: She is alert.  Psychiatric:  Mood and Affect: Mood normal.    Data Reviewed: Previous study not available No download from machine  Assessment:  History of obstructive sleep apnea, having difficulty with CPAP therapy  Study was a few years back  Will order a home sleep study  Pathophysiology of sleep disordered breathing discussed with the patient Treatment options discussed with the patient Discussion about inspire device had  Plan/Recommendations: Schedule patient for home sleep study  Weight loss efforts encouraged  Encouraged to call with any significant concerns  Follow-up in 3 to 4 months   Sherrilyn Rist MD [facility] 09/16/2020, 5:04 PM  CC: Cher Nakai, MD

## 2020-10-02 ENCOUNTER — Ambulatory Visit (INDEPENDENT_AMBULATORY_CARE_PROVIDER_SITE_OTHER): Payer: 59 | Admitting: Cardiology

## 2020-10-02 ENCOUNTER — Other Ambulatory Visit: Payer: Self-pay

## 2020-10-02 ENCOUNTER — Encounter: Payer: Self-pay | Admitting: Cardiology

## 2020-10-02 VITALS — BP 124/88 | HR 62 | Ht 66.0 in | Wt 243.0 lb

## 2020-10-02 DIAGNOSIS — E782 Mixed hyperlipidemia: Secondary | ICD-10-CM

## 2020-10-02 DIAGNOSIS — I10 Essential (primary) hypertension: Secondary | ICD-10-CM

## 2020-10-02 DIAGNOSIS — E669 Obesity, unspecified: Secondary | ICD-10-CM

## 2020-10-02 DIAGNOSIS — G4733 Obstructive sleep apnea (adult) (pediatric): Secondary | ICD-10-CM

## 2020-10-02 DIAGNOSIS — I2510 Atherosclerotic heart disease of native coronary artery without angina pectoris: Secondary | ICD-10-CM | POA: Insufficient documentation

## 2020-10-02 DIAGNOSIS — Z9989 Dependence on other enabling machines and devices: Secondary | ICD-10-CM

## 2020-10-02 DIAGNOSIS — I4819 Other persistent atrial fibrillation: Secondary | ICD-10-CM

## 2020-10-02 NOTE — H&P (View-Only) (Signed)
Cardiology Office Note:    Date:  10/02/2020   ID:  Melissa Boyer, DOB 06-May-1955, MRN [id number]  PCP:  Cher Nakai, MD  Cardiologist:  Berniece Salines, DO  Electrophysiologist:  None   Referring MD: Cher Nakai, MD   " I am always tired"  History of Present Illness:    Melissa Boyer is a 65 y.o. female with a hx of diabetes mellitus, hyperlipidemia, atrial fibrillation on Eliquis which was diagnosed in 2019, sleep apnea on CPAP, hypothyroidism, former smoker, coronary artery disease nonobstructive per her recent cardiac catheterization.  At her last visit we talked about her heart catheterization as well as placed a monitor on the patient to understand her atrial fibrillation burden.  She did wear a monitor which showed 100% burden of atrial fibrillation with frequent PVCs.  She is here today discuss her monitor results.  She tells me she has been experiencing great deal of fatigue.   Past Medical History:  Diagnosis Date  . Abnormal weight gain 07/10/2020  . Atrial fibrillation ([place])   . Change in bowel habit 07/10/2020  . DES exposure in utero   . DM2 (diabetes mellitus, type 2) ([place])   . Essential hypertension 01/01/2014  . Family history of breast cancer in first degree relative 01/01/2014  . Family history of colonic polyps 07/10/2020  . Fibroids   . Hepatitis C 11/99   2 yr tx, "remission", told Ag neg  . History of diabetes mellitus   . 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 Hypercholesteremia   . Hypertension   . Left renal mass 12/29/2019  . Mixed hyperlipidemia 02/27/2019  . Obesity (BMI 35.0-39.9 without comorbidity) 12/29/2019  . Obstructive sleep apnea on CPAP 06/19/2019  . Paroxysmal atrial fibrillation ([place]) 02/27/2019  . Pure hypercholesterolemia 01/01/2014  . Thyroid disease    hypothyroid  . Unspecified hypothyroidism 01/01/2014    Past Surgical History:  Procedure Laterality Date  . COLPOSCOPY  10/98   normal, no biopsy  .  ENDOMETRIAL ABLATION  12/02/2002  . ENDOMETRIAL BIOPSY  5/03  . RIGHT/LEFT HEART CATH AND CORONARY ANGIOGRAPHY N/A 07/18/2020   Procedure: RIGHT/LEFT HEART CATH AND CORONARY ANGIOGRAPHY;  Surgeon: Jettie Booze, MD;  Location: [facility];  Service: Cardiovascular;  Laterality: N/A;  . THYROIDECTOMY, PARTIAL     thyroid nodule excision left side    Current Medications: Current Meds  Medication Sig  . ALPRAZolam (XANAX) 0.5 MG tablet Take 0.5 mg by mouth 2 (two) times daily as needed for anxiety.  Marland Kitchen amLODipine (NORVASC) 5 MG tablet Take 2.5 mg by mouth at bedtime.  Marland Kitchen apixaban (ELIQUIS) 5 MG TABS tablet Take 1 tablet (5 mg total) by mouth 2 (two) times daily.  Marland Kitchen aspirin EC 81 MG tablet Take 1 tablet (81 mg total) by mouth daily. Swallow whole.  Marland Kitchen atorvastatin (LIPITOR) 20 MG tablet Take 20 mg by mouth daily.  . hydrochlorothiazide (HYDRODIURIL) 12.5 MG tablet Take 12.5 mg by mouth daily.  Marland Kitchen levothyroxine (SYNTHROID, LEVOTHROID) 125 MCG tablet Take 125 mcg by mouth daily before breakfast.  . losartan (COZAAR) 100 MG tablet Take 100 mg by mouth daily.  . meloxicam (MOBIC) 7.5 MG tablet Take 7.5 mg by mouth every other day.  . metFORMIN (GLUCOPHAGE) 500 MG tablet Take 1 tablet (500 mg total) by mouth 2 (two) times daily with a meal.  . metoprolol succinate (TOPROL-XL) 100 MG 24 hr tablet Take 100 mg by mouth daily.  Marland Kitchen  nitroGLYCERIN (NITROSTAT) 0.4 MG SL tablet Place 1 tablet (0.4 mg total) under the tongue every 5 (five) minutes as needed for chest pain.  Marland Kitchen triazolam (HALCION) 0.25 MG tablet Take 0.25 mg by mouth at bedtime.  . TRULICITY 1.5 PP/2.9JJ SOPN Inject 1.5 mg into the skin once a week.     Allergies:   Patient has no known allergies.   Social History   Socioeconomic History  . Marital status: Divorced    Spouse name: Not on file  . Number of children: 1  . Years of education: Not on file  . Highest education level: Not on file  Occupational History  . Not on file   Tobacco Use  . Smoking status: Former Smoker    Packs/day: 1.00    Years: 30.00    Pack years: 30.00    Types: Cigarettes    Quit date: 07/12/2003    Years since quitting: 17.2  . Smokeless tobacco: Never Used  Vaping Use  . Vaping Use: Never used  Substance and Sexual Activity  . Alcohol use: Never  . Drug use: No  . Sexual activity: Yes    Partners: Male    Birth control/protection: Post-menopausal  Other Topics Concern  . Not on file  Social History Narrative  . Not on file   Social Determinants of Health   Financial Resource Strain: Not on file  Food Insecurity: Not on file  Transportation Needs: Not on file  Physical Activity: Not on file  Stress: Not on file  Social Connections: Not on file     Family History: The patient's family history includes Cancer in her maternal aunt, mother, and sister; Diabetes in her maternal grandfather; Emphysema in her father; Heart attack in her maternal aunt and maternal grandmother; Hypertension in her father.  ROS:   Review of Systems  Constitution: Negative for decreased appetite, fever and weight gain.  HENT: Negative for congestion, ear discharge, hoarse voice and sore throat.   Eyes: Negative for discharge, redness, vision loss in right eye and visual halos.  Cardiovascular: Negative for chest pain, dyspnea on exertion, leg swelling, orthopnea and palpitations.  Respiratory: Negative for cough, hemoptysis, shortness of breath and snoring.   Endocrine: Negative for heat intolerance and polyphagia.  Hematologic/Lymphatic: Negative for bleeding problem. Does not bruise/bleed easily.  Skin: Negative for flushing, nail changes, rash and suspicious lesions.  Musculoskeletal: Negative for arthritis, joint pain, muscle cramps, myalgias, neck pain and stiffness.  Gastrointestinal: Negative for abdominal pain, bowel incontinence, diarrhea and excessive appetite.  Genitourinary: Negative for decreased libido, genital sores and  incomplete emptying.  Neurological: Negative for brief paralysis, focal weakness, headaches and loss of balance.  Psychiatric/Behavioral: Negative for altered mental status, depression and suicidal ideas.  Allergic/Immunologic: Negative for HIV exposure and persistent infections.    EKGs/Labs/Other Studies Reviewed:    The following studies were reviewed today:   EKG:  The ekg ordered today demonstrates atrial fibrillation with controlled ventricular rate.  ZIO monitor Patch Wear Time:  13 days and 13 hours starting August 06, 2020. Indication: Paroxysmal atrial fibrillation  Atrial Fibrillation occurred continuously (100% burden), ranging from 39-151 bpm (avg of 65 bpm).  Premature ventricular complexes were frequent (5.2%, [id number]). Ventricular Bigeminy and Trigeminy were present.   18 patient triggered events in 26 diary events were associated with the atrial fibrillation and premature ventricular complex.   Conclusion: Symptomatic atrial fibrillation and symptomatic frequent premature ventricular complex.   Transthoracic echocardiogram March 28,2022 IMPRESSIONS  1. Left ventricular  ejection fraction, by estimation, is 60 to 65%. The  left ventricle has normal function. The left ventricle has no regional  wall motion abnormalities. Left ventricular diastolic parameters are  indeterminate.  2. Right ventricular systolic function is normal. The right ventricular  size is normal. There is normal pulmonary artery systolic pressure.  3. Left atrial size was mild to moderately dilated.  4. The mitral valve is normal in structure. No evidence of mitral valve  regurgitation. No evidence of mitral stenosis.  5. The aortic valve is normal in structure. Aortic valve regurgitation is  not visualized. No aortic stenosis is present.  6. There is mild dilatation of the ascending aorta, measuring 38 mm.  7. The inferior vena cava is normal in size with greater than 50%   respiratory variability, suggesting right atrial pressure of 3 mmHg.   Left heart catheterization 07/18/2020  Ost Cx lesion is 25% stenosed.  Mid LAD lesion is 25% stenosed.  The left ventricular systolic function is normal.  LV end diastolic pressure is mildly elevated.  The left ventricular ejection fraction is 55-65% by visual estimate.  There is no aortic valve stenosis.  Ao sat 99%, PA sat 73%, PA pressure 32/12, mean PA 25 mm Hg; mean PCWP 20 mm Hg; CO 5.1 L/min; CI 2.38 Continue preventive therapy. Resume Eliquis tomorrow. Resume metformin on 07/20/20. Further plan per Dr. Harriet Masson.    Recent Labs: 07/14/2020: BUN 14; Creatinine, Ser 0.84; Magnesium 1.8; Platelets 240 07/18/2020: Hemoglobin 13.9; Potassium 3.3; Sodium 142  Recent Lipid Panel No results found for: CHOL, TRIG, HDL, CHOLHDL, VLDL, LDLCALC, LDLDIRECT  Physical Exam:    VS:  BP 124/88   Pulse 62   Ht 5\' 6"  (1.676 m)   Wt 243 lb (110.2 kg)   LMP 12/02/2002 (Approximate)   SpO2 97%   BMI 39.22 kg/m     Wt Readings from Last 3 Encounters:  10/02/20 243 lb (110.2 kg)  09/16/20 240 lb 12.8 oz (109.2 kg)  08/06/20 240 lb 12.8 oz (109.2 kg)     GEN: Well nourished, well developed in no acute distress HEENT: Normal NECK: No JVD; No carotid bruits LYMPHATICS: No lymphadenopathy CARDIAC: S1S2 noted,RRR, no murmurs, rubs, gallops RESPIRATORY:  Clear to auscultation without rales, wheezing or rhonchi  ABDOMEN: Soft, non-tender, non-distended, +bowel sounds, no guarding. EXTREMITIES: No edema, No cyanosis, no clubbing MUSCULOSKELETAL:  No deformity  SKIN: Warm and dry NEUROLOGIC:  Alert and oriented x 3, non-focal PSYCHIATRIC:  Normal affect, good insight  ASSESSMENT:    1. Persistent atrial fibrillation ([place])   2. Essential hypertension   3. Obstructive sleep apnea on CPAP   4. Mixed hyperlipidemia   5. Obesity (BMI 35.0-39.9 without comorbidity)   6. Mild CAD    PLAN:     Her monitor showed  continuous 100% atrial fibrillation with frequent PVCs.  We will likely do its attempt TEE cardioversion in this patient.  I am hoping that this will be successful but she does have moderately dilated left atrium which could lower her chances of converting to sinus rhythm.  I explained this to the patient.  She is agreeable to proceed with her TEE cardioversion.  In the meantime she will remain on Eliquis 5 mg twice a day as well as her metoprolol succinate. The patient that she has to continue her Eliquis without interruption before her procedure, and cannot stop this medicine for minimum 4 weeks post cardioversion.  Will assess the need for antiarrhythmics after her TEE  cardioversion.  In the meantime also referred the patient to EP.  She is using her CPAP machine but she tells me that this is not functioning well.  She did see pulmonary to help with reassessing the patient for a new CPAP machine but she tells me this is not working out well.  She also plans to follow-up with the pulmonary doctor soon.  The patient understands the need to lose weight with diet and exercise. We have discussed specific strategies for this.  The patient understands the need to lose weight with diet and exercise. We have discussed specific strategies for this.  The patient is in agreement with the above plan. The patient left the office in stable condition.  The patient will follow up in   Medication Adjustments/Labs and Tests Ordered: Current medicines are reviewed at length with the patient today.  Concerns regarding medicines are outlined above.  No orders of the defined types were placed in this encounter.  No orders of the defined types were placed in this encounter.   There are no Patient Instructions on file for this visit.   Adopting a Healthy Lifestyle.  Know what a healthy weight is for you (roughly BMI <25) and aim to maintain this   Aim for 7+ servings of fruits and vegetables daily   65-80+  fluid ounces of water or unsweet tea for healthy kidneys   Limit to max 1 drink of alcohol per day; avoid smoking/tobacco   Limit animal fats in diet for cholesterol and heart health - choose grass fed whenever available   Avoid highly processed foods, and foods high in saturated/trans fats   Aim for low stress - take time to unwind and care for your mental health   Aim for 150 min of moderate intensity exercise weekly for heart health, and weights twice weekly for bone health   Aim for 7-9 hours of sleep daily   When it comes to diets, agreement about the perfect plan isnt easy to find, even among the experts. Experts at the [employer] developed an idea known as the Healthy Eating Plate. Just imagine a plate divided into logical, healthy portions.   The emphasis is on diet quality:   Load up on vegetables and fruits - one-half of your plate: Aim for color and variety, and remember that potatoes dont count.   Go for whole grains - one-quarter of your plate: Whole wheat, barley, wheat berries, quinoa, oats, brown rice, and foods made with them. If you want pasta, go with whole wheat pasta.   Protein power - one-quarter of your plate: Fish, chicken, beans, and nuts are all healthy, versatile protein sources. Limit red meat.   The diet, however, does go beyond the plate, offering a few other suggestions.   Use healthy plant oils, such as olive, canola, soy, corn, sunflower and peanut. Check the labels, and avoid partially hydrogenated oil, which have unhealthy trans fats.   If youre thirsty, drink water. Coffee and tea are good in moderation, but skip sugary drinks and limit milk and dairy products to one or two daily servings.   The type of carbohydrate in the diet is more important than the amount. Some sources of carbohydrates, such as vegetables, fruits, whole grains, and beans-are healthier than others.   Finally, stay active  Signed, Berniece Salines, DO   10/02/2020 4:54 PM    [facility]

## 2020-10-02 NOTE — Patient Instructions (Addendum)
Medication Instructions:  Your physician recommends that you continue on your current medications as directed. Please refer to the Current Medication list given to you today.  *If you need a refill on your cardiac medications before your next appointment, please call your pharmacy*   Lab Work: Your physician recommends that you return for lab work: NEXT WEEK: BMET, Mag, CBC If you have labs (blood work) drawn today and your tests are completely normal, you will receive your results only by: Marland Kitchen MyChart Message (if you have MyChart) OR . A paper copy in the mail If you have any lab test that is abnormal or we need to change your treatment, we will call you to review the results.   Testing/Procedures: Your physician has requested that you have a TEE/Cardioversion. During a TEE, sound waves are used to create images of your heart. It provides your doctor with information about the size and shape of your heart and how well your heart's chambers and valves are working. In this test, a transducer is attached to the end of a flexible tube that is guided down you throat and into your esophagus (the tube leading from your mouth to your stomach) to get a more detailed image of your heart. Once the TEE has determined that a blood clot is not present, the cardioversion begins. Electrical Cardioversion uses a jolt of electricity to your heart either through paddles or wired patches attached to your chest. This is a controlled, usually prescheduled, procedure. This procedure is done at the hospital and you are not awake during the procedure. You usually go home the day of the procedure. Please see the instruction sheet given to you today for more information.     Follow-Up: At [facility], you and your health needs are our priority.  As part of our continuing mission to provide you with exceptional heart care, we have created designated Provider Care Teams.  These Care Teams include your primary  Cardiologist (physician) and Advanced Practice Providers (APPs -  Physician Assistants and Nurse Practitioners) who all work together to provide you with the care you need, when you need it.  We recommend signing up for the patient portal called "MyChart".  Sign up information is provided on this After Visit Summary.  MyChart is used to connect with patients for Virtual Visits (Telemedicine).  Patients are able to view lab/test results, encounter notes, upcoming appointments, etc.  Non-urgent messages can be sent to your provider as well.   To learn more about what you can do with MyChart, go to [email or web address].    Your next appointment:   2 week(s) after TEE/Cardioversion  The format for your next appointment:   In Person  Provider:   Berniece Salines, DO   Other Instructions            PATIENT INSTRUCTIONS    [place] [facility]     [place]   Patient Name: Melissa Boyer  DATE AND TIME OF PROCEDURE: June 16th 2022 at 9:30 am   DO NOT EAT OR DRINK ANYTHING after midnight prior to your procedure.  You should take your medications as usual with a sip of water.   If you are diabetic, DO NOT TAKE YOUR DIABETIC MEDICATIONS the morning OF THE PROCEDURE.   If you are taking Lanoxin (also called Digoxin), do NOT take this medication the morning of the procedure.  DO NOT STOP any blood thinners that you may be taking.   Have your blood drawn as intructed.   You will need someone with you to drive you home after the procedure.   If you have any questions, please call our office at [phone].

## 2020-10-02 NOTE — Progress Notes (Signed)
Cardiology Office Note:    Date:  10/02/2020   ID:  Melissa Boyer, DOB [birth year], MRN [id number]  PCP:  Cher Nakai, MD  Cardiologist:  Berniece Salines, DO  Electrophysiologist:  None   Referring MD: Cher Nakai, MD   " I am always tired"  History of Present Illness:    Melissa Boyer is a 65 y.o. female with a hx of diabetes mellitus, hyperlipidemia, atrial fibrillation on Eliquis which was diagnosed in 2019, sleep apnea on CPAP, hypothyroidism, former smoker, coronary artery disease nonobstructive per her recent cardiac catheterization.  At her last visit we talked about her heart catheterization as well as placed a monitor on the patient to understand her atrial fibrillation burden.  She did wear a monitor which showed 100% burden of atrial fibrillation with frequent PVCs.  She is here today discuss her monitor results.  She tells me she has been experiencing great deal of fatigue.   Past Medical History:  Diagnosis Date  . Abnormal weight gain 07/10/2020  . Atrial fibrillation ([place])   . Change in bowel habit 07/10/2020  . DES exposure in utero   . DM2 (diabetes mellitus, type 2) ([place])   . Essential hypertension 01/01/2014  . Family history of breast cancer in first degree relative 01/01/2014  . Family history of colonic polyps 07/10/2020  . Fibroids   . Hepatitis C 11/99   2 yr tx, "remission", told Ag neg  . History of diabetes mellitus   . 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 Hypercholesteremia   . Hypertension   . Left renal mass 12/29/2019  . Mixed hyperlipidemia 02/27/2019  . Obesity (BMI 35.0-39.9 without comorbidity) 12/29/2019  . Obstructive sleep apnea on CPAP 06/19/2019  . Paroxysmal atrial fibrillation ([place]) 02/27/2019  . Pure hypercholesterolemia 01/01/2014  . Thyroid disease    hypothyroid  . Unspecified hypothyroidism 01/01/2014    Past Surgical History:  Procedure Laterality Date  . COLPOSCOPY  10/98   normal, no biopsy  .  ENDOMETRIAL ABLATION  12/02/2002  . ENDOMETRIAL BIOPSY  5/03  . RIGHT/LEFT HEART CATH AND CORONARY ANGIOGRAPHY N/A 07/18/2020   Procedure: RIGHT/LEFT HEART CATH AND CORONARY ANGIOGRAPHY;  Surgeon: Jettie Booze, MD;  Location: [facility];  Service: Cardiovascular;  Laterality: N/A;  . THYROIDECTOMY, PARTIAL     thyroid nodule excision left side    Current Medications: Current Meds  Medication Sig  . ALPRAZolam (XANAX) 0.5 MG tablet Take 0.5 mg by mouth 2 (two) times daily as needed for anxiety.  Marland Kitchen amLODipine (NORVASC) 5 MG tablet Take 2.5 mg by mouth at bedtime.  Marland Kitchen apixaban (ELIQUIS) 5 MG TABS tablet Take 1 tablet (5 mg total) by mouth 2 (two) times daily.  Marland Kitchen aspirin EC 81 MG tablet Take 1 tablet (81 mg total) by mouth daily. Swallow whole.  Marland Kitchen atorvastatin (LIPITOR) 20 MG tablet Take 20 mg by mouth daily.  . hydrochlorothiazide (HYDRODIURIL) 12.5 MG tablet Take 12.5 mg by mouth daily.  Marland Kitchen levothyroxine (SYNTHROID, LEVOTHROID) 125 MCG tablet Take 125 mcg by mouth daily before breakfast.  . losartan (COZAAR) 100 MG tablet Take 100 mg by mouth daily.  . meloxicam (MOBIC) 7.5 MG tablet Take 7.5 mg by mouth every other day.  . metFORMIN (GLUCOPHAGE) 500 MG tablet Take 1 tablet (500 mg total) by mouth 2 (two) times daily with a meal.  . metoprolol succinate (TOPROL-XL) 100 MG 24 hr tablet Take 100 mg by mouth daily.  Marland Kitchen  nitroGLYCERIN (NITROSTAT) 0.4 MG SL tablet Place 1 tablet (0.4 mg total) under the tongue every 5 (five) minutes as needed for chest pain.  Marland Kitchen triazolam (HALCION) 0.25 MG tablet Take 0.25 mg by mouth at bedtime.  . TRULICITY 1.5 BP/1.0CH SOPN Inject 1.5 mg into the skin once a week.     Allergies:   Patient has no known allergies.   Social History   Socioeconomic History  . Marital status: Divorced    Spouse name: Not on file  . Number of children: 1  . Years of education: Not on file  . Highest education level: Not on file  Occupational History  . Not on file   Tobacco Use  . Smoking status: Former Smoker    Packs/day: 1.00    Years: 30.00    Pack years: 30.00    Types: Cigarettes    Quit date: 07/12/2003    Years since quitting: 17.2  . Smokeless tobacco: Never Used  Vaping Use  . Vaping Use: Never used  Substance and Sexual Activity  . Alcohol use: Never  . Drug use: No  . Sexual activity: Yes    Partners: Male    Birth control/protection: Post-menopausal  Other Topics Concern  . Not on file  Social History Narrative  . Not on file   Social Determinants of Health   Financial Resource Strain: Not on file  Food Insecurity: Not on file  Transportation Needs: Not on file  Physical Activity: Not on file  Stress: Not on file  Social Connections: Not on file     Family History: The patient's family history includes Cancer in her maternal aunt, mother, and sister; Diabetes in her maternal grandfather; Emphysema in her father; Heart attack in her maternal aunt and maternal grandmother; Hypertension in her father.  ROS:   Review of Systems  Constitution: Negative for decreased appetite, fever and weight gain.  HENT: Negative for congestion, ear discharge, hoarse voice and sore throat.   Eyes: Negative for discharge, redness, vision loss in right eye and visual halos.  Cardiovascular: Negative for chest pain, dyspnea on exertion, leg swelling, orthopnea and palpitations.  Respiratory: Negative for cough, hemoptysis, shortness of breath and snoring.   Endocrine: Negative for heat intolerance and polyphagia.  Hematologic/Lymphatic: Negative for bleeding problem. Does not bruise/bleed easily.  Skin: Negative for flushing, nail changes, rash and suspicious lesions.  Musculoskeletal: Negative for arthritis, joint pain, muscle cramps, myalgias, neck pain and stiffness.  Gastrointestinal: Negative for abdominal pain, bowel incontinence, diarrhea and excessive appetite.  Genitourinary: Negative for decreased libido, genital sores and  incomplete emptying.  Neurological: Negative for brief paralysis, focal weakness, headaches and loss of balance.  Psychiatric/Behavioral: Negative for altered mental status, depression and suicidal ideas.  Allergic/Immunologic: Negative for HIV exposure and persistent infections.    EKGs/Labs/Other Studies Reviewed:    The following studies were reviewed today:   EKG:  The ekg ordered today demonstrates atrial fibrillation with controlled ventricular rate.  ZIO monitor Patch Wear Time:  13 days and 13 hours starting August 06, 2020. Indication: Paroxysmal atrial fibrillation  Atrial Fibrillation occurred continuously (100% burden), ranging from 39-151 bpm (avg of 65 bpm).  Premature ventricular complexes were frequent (5.2%, [id number]). Ventricular Bigeminy and Trigeminy were present.   18 patient triggered events in 26 diary events were associated with the atrial fibrillation and premature ventricular complex.   Conclusion: Symptomatic atrial fibrillation and symptomatic frequent premature ventricular complex.   Transthoracic echocardiogram March 28,2022 IMPRESSIONS  1. Left ventricular  ejection fraction, by estimation, is 60 to 65%. The  left ventricle has normal function. The left ventricle has no regional  wall motion abnormalities. Left ventricular diastolic parameters are  indeterminate.  2. Right ventricular systolic function is normal. The right ventricular  size is normal. There is normal pulmonary artery systolic pressure.  3. Left atrial size was mild to moderately dilated.  4. The mitral valve is normal in structure. No evidence of mitral valve  regurgitation. No evidence of mitral stenosis.  5. The aortic valve is normal in structure. Aortic valve regurgitation is  not visualized. No aortic stenosis is present.  6. There is mild dilatation of the ascending aorta, measuring 38 mm.  7. The inferior vena cava is normal in size with greater than 50%   respiratory variability, suggesting right atrial pressure of 3 mmHg.   Left heart catheterization 07/18/2020  Ost Cx lesion is 25% stenosed.  Mid LAD lesion is 25% stenosed.  The left ventricular systolic function is normal.  LV end diastolic pressure is mildly elevated.  The left ventricular ejection fraction is 55-65% by visual estimate.  There is no aortic valve stenosis.  Ao sat 99%, PA sat 73%, PA pressure 32/12, mean PA 25 mm Hg; mean PCWP 20 mm Hg; CO 5.1 L/min; CI 2.38 Continue preventive therapy. Resume Eliquis tomorrow. Resume metformin on 07/20/20. Further plan per Dr. Harriet Masson.    Recent Labs: 07/14/2020: BUN 14; Creatinine, Ser 0.84; Magnesium 1.8; Platelets 240 07/18/2020: Hemoglobin 13.9; Potassium 3.3; Sodium 142  Recent Lipid Panel No results found for: CHOL, TRIG, HDL, CHOLHDL, VLDL, LDLCALC, LDLDIRECT  Physical Exam:    VS:  BP 124/88   Pulse 62   Ht 5\' 6"  (1.676 m)   Wt 243 lb (110.2 kg)   LMP 12/02/2002 (Approximate)   SpO2 97%   BMI 39.22 kg/m     Wt Readings from Last 3 Encounters:  10/02/20 243 lb (110.2 kg)  09/16/20 240 lb 12.8 oz (109.2 kg)  08/06/20 240 lb 12.8 oz (109.2 kg)     GEN: Well nourished, well developed in no acute distress HEENT: Normal NECK: No JVD; No carotid bruits LYMPHATICS: No lymphadenopathy CARDIAC: S1S2 noted,RRR, no murmurs, rubs, gallops RESPIRATORY:  Clear to auscultation without rales, wheezing or rhonchi  ABDOMEN: Soft, non-tender, non-distended, +bowel sounds, no guarding. EXTREMITIES: No edema, No cyanosis, no clubbing MUSCULOSKELETAL:  No deformity  SKIN: Warm and dry NEUROLOGIC:  Alert and oriented x 3, non-focal PSYCHIATRIC:  Normal affect, good insight  ASSESSMENT:    1. Persistent atrial fibrillation ([place])   2. Essential hypertension   3. Obstructive sleep apnea on CPAP   4. Mixed hyperlipidemia   5. Obesity (BMI 35.0-39.9 without comorbidity)   6. Mild CAD    PLAN:     Her monitor showed  continuous 100% atrial fibrillation with frequent PVCs.  We will likely do its attempt TEE cardioversion in this patient.  I am hoping that this will be successful but she does have moderately dilated left atrium which could lower her chances of converting to sinus rhythm.  I explained this to the patient.  She is agreeable to proceed with her TEE cardioversion.  In the meantime she will remain on Eliquis 5 mg twice a day as well as her metoprolol succinate. The patient that she has to continue her Eliquis without interruption before her procedure, and cannot stop this medicine for minimum 4 weeks post cardioversion.  Will assess the need for antiarrhythmics after her TEE  cardioversion.  In the meantime also referred the patient to EP.  She is using her CPAP machine but she tells me that this is not functioning well.  She did see pulmonary to help with reassessing the patient for a new CPAP machine but she tells me this is not working out well.  She also plans to follow-up with the pulmonary doctor soon.  The patient understands the need to lose weight with diet and exercise. We have discussed specific strategies for this.  The patient understands the need to lose weight with diet and exercise. We have discussed specific strategies for this.  The patient is in agreement with the above plan. The patient left the office in stable condition.  The patient will follow up in   Medication Adjustments/Labs and Tests Ordered: Current medicines are reviewed at length with the patient today.  Concerns regarding medicines are outlined above.  No orders of the defined types were placed in this encounter.  No orders of the defined types were placed in this encounter.   There are no Patient Instructions on file for this visit.   Adopting a Healthy Lifestyle.  Know what a healthy weight is for you (roughly BMI <25) and aim to maintain this   Aim for 7+ servings of fruits and vegetables daily   65-80+  fluid ounces of water or unsweet tea for healthy kidneys   Limit to max 1 drink of alcohol per day; avoid smoking/tobacco   Limit animal fats in diet for cholesterol and heart health - choose grass fed whenever available   Avoid highly processed foods, and foods high in saturated/trans fats   Aim for low stress - take time to unwind and care for your mental health   Aim for 150 min of moderate intensity exercise weekly for heart health, and weights twice weekly for bone health   Aim for 7-9 hours of sleep daily   When it comes to diets, agreement about the perfect plan isnt easy to find, even among the experts. Experts at the [employer] developed an idea known as the Healthy Eating Plate. Just imagine a plate divided into logical, healthy portions.   The emphasis is on diet quality:   Load up on vegetables and fruits - one-half of your plate: Aim for color and variety, and remember that potatoes dont count.   Go for whole grains - one-quarter of your plate: Whole wheat, barley, wheat berries, quinoa, oats, brown rice, and foods made with them. If you want pasta, go with whole wheat pasta.   Protein power - one-quarter of your plate: Fish, chicken, beans, and nuts are all healthy, versatile protein sources. Limit red meat.   The diet, however, does go beyond the plate, offering a few other suggestions.   Use healthy plant oils, such as olive, canola, soy, corn, sunflower and peanut. Check the labels, and avoid partially hydrogenated oil, which have unhealthy trans fats.   If youre thirsty, drink water. Coffee and tea are good in moderation, but skip sugary drinks and limit milk and dairy products to one or two daily servings.   The type of carbohydrate in the diet is more important than the amount. Some sources of carbohydrates, such as vegetables, fruits, whole grains, and beans-are healthier than others.   Finally, stay active  Signed, Berniece Salines, DO   10/02/2020 4:54 PM    [facility]

## 2020-10-07 ENCOUNTER — Other Ambulatory Visit: Payer: 59

## 2020-10-07 NOTE — Progress Notes (Signed)
[place]  [address] [place],    [address] [phone]  Clinic Day:  10/10/2019  Referring physician: Dr Cher Nakai  This document serves as a record of services personally performed by Dequincy Macarthur Critchley, MD. It was created on their behalf by [facility] E, a trained medical scribe. The creation of this record is based on the scribe's personal observations and the provider's statements to them.  HISTORY OF PRESENT ILLNESS:  The patient is a 65 y.o. female  with hemochromatosis (H63D/H63D).  She comes in today reassess her iron parameters.  Since her last visit, the patient has been doing okay.  She still complains of fatigue, but denies having any other systemic symptoms which concern her for complications related to her hemochromatosis.    PHYSICAL EXAM:  Blood pressure (!) 181/90, pulse 61, temperature 97.7 F (36.5 C), resp. rate 16, height 5\' 6"  (1.676 m), weight 238 lb 9.6 oz (108.2 kg), last menstrual period 12/02/2002, SpO2 96 %. Wt Readings from Last 3 Encounters:  10/09/20 238 lb 9.6 oz (108.2 kg)  10/02/20 243 lb (110.2 kg)  09/16/20 240 lb 12.8 oz (109.2 kg)   Body mass index is 38.51 kg/m. Performance status (ECOG): 1 Physical Exam Constitutional:      Appearance: Normal appearance. She is not ill-appearing.  HENT:     Mouth/Throat:     Mouth: Mucous membranes are moist.     Pharynx: Oropharynx is clear. No oropharyngeal exudate or posterior oropharyngeal erythema.  Cardiovascular:     Rate and Rhythm: Normal rate and regular rhythm.     Heart sounds: No murmur heard.   No friction rub. No gallop.  Pulmonary:     Effort: Pulmonary effort is normal. No respiratory distress.     Breath sounds: Normal breath sounds. No wheezing, rhonchi or rales.  Chest:  Breasts:    Right: No axillary adenopathy or supraclavicular adenopathy.     Left: No axillary adenopathy or supraclavicular adenopathy.  Abdominal:     General:  Bowel sounds are normal. There is no distension.     Palpations: Abdomen is soft. There is no mass.     Tenderness: There is no abdominal tenderness.  Musculoskeletal:        General: No swelling.     Right lower leg: No edema.     Left lower leg: No edema.  Lymphadenopathy:     Cervical: No cervical adenopathy.     Upper Body:     Right upper body: No supraclavicular or axillary adenopathy.     Left upper body: No supraclavicular or axillary adenopathy.     Lower Body: No right inguinal adenopathy. No left inguinal adenopathy.  Skin:    General: Skin is warm.     Coloration: Skin is not jaundiced.     Findings: No lesion or rash.  Neurological:     General: No focal deficit present.     Mental Status: She is alert and oriented to person, place, and time. Mental status is at baseline.     Cranial Nerves: Cranial nerves are intact.  Psychiatric:        Mood and Affect: Mood normal.        Behavior: Behavior normal.        Thought Content: Thought content normal.   LABS:   Component Ref Range & Units 1 d ago 6 mo ago  Iron 28 - 170 ug/dL 141  109   TIBC 250 - 450  ug/dL 395  364   Saturation Ratios 10.4 - 31.8 % 36 High   30   UIBC ug/dL 254  255 CM    Component Ref Range & Units 1 d ago 6 mo ago  Ferritin 11 - 307 ng/mL 210  298 CM     ASSESSMENT & PLAN:  A 65 y.o. female with hemochromatosis (H63D/H63D).  Her iron parameters are not much different from what they were 6 months ago.  In fact, her ferritin level is lower.  She understands she does not have the hemochromatosis genotype that is associated with severe iron overload and secondary organ damage.  For now, her iron parameters will be followed conservatively.  If I see a brisk rise in her ferritin and other iron parameters, I would consider performing a phlebotomy.  Of note, this patient has past scans which have shown 2 small lesions in her left kidney that are worrisome for early stage renal cell carcinomas.  She  requests to be seen by interventional radiology to have these lesions potentially ablated.  I will place the referral for their evaluation.  Otherwise, I will see her back in 6 months to reassess her iron parameters.  The patient understands all the plans discussed today and is in agreement with them.   I, Rita Ohara, am acting as scribe for Marice Potter, MD    I have reviewed this report as typed by the medical scribe, and it is complete and accurate.  Dequincy Macarthur Critchley, MD

## 2020-10-08 ENCOUNTER — Other Ambulatory Visit: Payer: Self-pay

## 2020-10-08 ENCOUNTER — Inpatient Hospital Stay: Payer: 59 | Attending: Oncology

## 2020-10-08 LAB — IRON AND TIBC
Iron: 141 ug/dL (ref 28–170)
Saturation Ratios: 36 % — ABNORMAL HIGH (ref 10.4–31.8)
TIBC: 395 ug/dL (ref 250–450)
UIBC: 254 ug/dL

## 2020-10-08 LAB — FERRITIN: Ferritin: 210 ng/mL (ref 11–307)

## 2020-10-09 ENCOUNTER — Inpatient Hospital Stay (INDEPENDENT_AMBULATORY_CARE_PROVIDER_SITE_OTHER): Payer: 59 | Admitting: Oncology

## 2020-10-09 ENCOUNTER — Telehealth: Payer: Self-pay | Admitting: Oncology

## 2020-10-09 ENCOUNTER — Other Ambulatory Visit: Payer: Self-pay | Admitting: Oncology

## 2020-10-09 NOTE — Telephone Encounter (Signed)
Per 6/9 LOS, patient scheduled for Dec Appt's.  Gave patient Appt Summary

## 2020-10-13 ENCOUNTER — Telehealth: Payer: Self-pay | Admitting: Cardiology

## 2020-10-13 NOTE — Telephone Encounter (Signed)
Patient wanted to get some pre-procedure instructions for her TEE Thursday 10/16/20. Please call

## 2020-10-14 ENCOUNTER — Telehealth: Payer: Self-pay | Admitting: Pulmonary Disease

## 2020-10-14 DIAGNOSIS — R0683 Snoring: Secondary | ICD-10-CM

## 2020-10-14 NOTE — Telephone Encounter (Signed)
Split night sleep study order placed. Sleep center to contact patient. Left message with patient to make aware.   Nothing further needed at this time.

## 2020-10-14 NOTE — Telephone Encounter (Signed)
I called and spoke with patient regarding HST. Patient stated she saw Dr. Harriet Masson last week and wants patient to in have in-lab sleep study and not HST due to her A-fib issues. Will route to Dr. Ander Slade for recs and call back.  Dr. Ander Slade, please advise. Thanks!

## 2020-10-14 NOTE — Telephone Encounter (Signed)
Spoke with patient about TEE instructions. Patient verbalizes understanding. No further questions or concerns at this time.

## 2020-10-14 NOTE — Telephone Encounter (Signed)
Pt would like to know if AO thinks an in lab sleep study would be better vs an home sleep study b/c pt has a-fib. Please advise.

## 2020-10-14 NOTE — Telephone Encounter (Signed)
Okay to order a split night study

## 2020-10-15 ENCOUNTER — Encounter: Payer: Self-pay | Admitting: Oncology

## 2020-10-15 LAB — CBC WITH DIFFERENTIAL/PLATELET
Basophils Absolute: 0.1 10*3/uL (ref 0.0–0.2)
Basos: 1 %
EOS (ABSOLUTE): 0.2 10*3/uL (ref 0.0–0.4)
Eos: 3 %
Hematocrit: 45.5 % (ref 34.0–46.6)
Hemoglobin: 15.6 g/dL (ref 11.1–15.9)
Immature Grans (Abs): 0 10*3/uL (ref 0.0–0.1)
Immature Granulocytes: 0 %
Lymphocytes Absolute: 3 10*3/uL (ref 0.7–3.1)
Lymphs: 38 %
MCH: 30.6 pg (ref 26.6–33.0)
MCHC: 34.3 g/dL (ref 31.5–35.7)
MCV: 89 fL (ref 79–97)
Monocytes Absolute: 0.5 10*3/uL (ref 0.1–0.9)
Monocytes: 7 %
Neutrophils Absolute: 4.1 10*3/uL (ref 1.4–7.0)
Neutrophils: 51 %
Platelets: 249 10*3/uL (ref 150–450)
RBC: 5.09 x10E6/uL (ref 3.77–5.28)
RDW: 12.5 % (ref 11.7–15.4)
WBC: 7.9 10*3/uL (ref 3.4–10.8)

## 2020-10-15 LAB — MAGNESIUM: Magnesium: 2 mg/dL (ref 1.6–2.3)

## 2020-10-15 LAB — BASIC METABOLIC PANEL
BUN/Creatinine Ratio: 22 (ref 12–28)
BUN: 17 mg/dL (ref 8–27)
CO2: 22 mmol/L (ref 20–29)
Calcium: 9.7 mg/dL (ref 8.7–10.3)
Chloride: 99 mmol/L (ref 96–106)
Creatinine, Ser: 0.78 mg/dL (ref 0.57–1.00)
Glucose: 121 mg/dL — ABNORMAL HIGH (ref 65–99)
Potassium: 4 mmol/L (ref 3.5–5.2)
Sodium: 141 mmol/L (ref 134–144)
eGFR: 84 mL/min/{1.73_m2} (ref >59)

## 2020-10-15 NOTE — Addendum Note (Signed)
Addended by: Berniece Salines on: 10/15/2020 03:24 PM   Modules accepted: Orders, SmartSet

## 2020-10-16 ENCOUNTER — Encounter (HOSPITAL_COMMUNITY): Payer: Self-pay | Admitting: Internal Medicine

## 2020-10-16 ENCOUNTER — Ambulatory Visit (HOSPITAL_BASED_OUTPATIENT_CLINIC_OR_DEPARTMENT_OTHER)
Admission: RE | Admit: 2020-10-16 | Discharge: 2020-10-16 | Disposition: A | Discharge status: Home / Self Care | Payer: 59 | Source: Ambulatory Visit | Attending: Cardiology | Admitting: Cardiology

## 2020-10-16 ENCOUNTER — Telehealth: Payer: Self-pay

## 2020-10-16 ENCOUNTER — Encounter (HOSPITAL_COMMUNITY)
Admission: RE | Disposition: A | Discharge status: Home / Self Care | Payer: Self-pay | Source: Home / Self Care | Attending: Internal Medicine

## 2020-10-16 ENCOUNTER — Ambulatory Visit (HOSPITAL_COMMUNITY): Payer: 59 | Admitting: Anesthesiology

## 2020-10-16 ENCOUNTER — Ambulatory Visit (HOSPITAL_COMMUNITY)
Admission: RE | Admit: 2020-10-16 | Discharge: 2020-10-16 | Disposition: A | Discharge status: Home / Self Care | Payer: 59 | Attending: Internal Medicine | Admitting: Internal Medicine

## 2020-10-16 DIAGNOSIS — G4733 Obstructive sleep apnea (adult) (pediatric): Secondary | ICD-10-CM | POA: Diagnosis not present

## 2020-10-16 DIAGNOSIS — E119 Type 2 diabetes mellitus without complications: Secondary | ICD-10-CM | POA: Insufficient documentation

## 2020-10-16 DIAGNOSIS — Z794 Long term (current) use of insulin: Secondary | ICD-10-CM | POA: Diagnosis not present

## 2020-10-16 DIAGNOSIS — Z7901 Long term (current) use of anticoagulants: Secondary | ICD-10-CM | POA: Insufficient documentation

## 2020-10-16 DIAGNOSIS — Z79899 Other long term (current) drug therapy: Secondary | ICD-10-CM | POA: Diagnosis not present

## 2020-10-16 DIAGNOSIS — I351 Nonrheumatic aortic (valve) insufficiency: Secondary | ICD-10-CM | POA: Diagnosis not present

## 2020-10-16 DIAGNOSIS — Z7982 Long term (current) use of aspirin: Secondary | ICD-10-CM | POA: Insufficient documentation

## 2020-10-16 DIAGNOSIS — I4819 Other persistent atrial fibrillation: Secondary | ICD-10-CM | POA: Diagnosis not present

## 2020-10-16 DIAGNOSIS — I700 Atherosclerosis of aorta: Secondary | ICD-10-CM | POA: Diagnosis not present

## 2020-10-16 DIAGNOSIS — E782 Mixed hyperlipidemia: Secondary | ICD-10-CM | POA: Diagnosis not present

## 2020-10-16 DIAGNOSIS — Z6839 Body mass index (BMI) 39.0-39.9, adult: Secondary | ICD-10-CM | POA: Insufficient documentation

## 2020-10-16 DIAGNOSIS — I340 Nonrheumatic mitral (valve) insufficiency: Secondary | ICD-10-CM | POA: Diagnosis not present

## 2020-10-16 DIAGNOSIS — Z7984 Long term (current) use of oral hypoglycemic drugs: Secondary | ICD-10-CM | POA: Insufficient documentation

## 2020-10-16 DIAGNOSIS — I2510 Atherosclerotic heart disease of native coronary artery without angina pectoris: Secondary | ICD-10-CM | POA: Insufficient documentation

## 2020-10-16 DIAGNOSIS — R9431 Abnormal electrocardiogram [ECG] [EKG]: Secondary | ICD-10-CM | POA: Diagnosis not present

## 2020-10-16 DIAGNOSIS — I10 Essential (primary) hypertension: Secondary | ICD-10-CM | POA: Insufficient documentation

## 2020-10-16 DIAGNOSIS — Z79890 Hormone replacement therapy (postmenopausal): Secondary | ICD-10-CM | POA: Diagnosis not present

## 2020-10-16 DIAGNOSIS — E669 Obesity, unspecified: Secondary | ICD-10-CM | POA: Diagnosis not present

## 2020-10-16 DIAGNOSIS — Z87891 Personal history of nicotine dependence: Secondary | ICD-10-CM | POA: Insufficient documentation

## 2020-10-16 DIAGNOSIS — E039 Hypothyroidism, unspecified: Secondary | ICD-10-CM | POA: Diagnosis not present

## 2020-10-16 HISTORY — PX: CARDIOVERSION: SHX1299

## 2020-10-16 HISTORY — PX: TEE WITHOUT CARDIOVERSION: SHX5443

## 2020-10-16 HISTORY — PX: BUBBLE STUDY: SHX6837

## 2020-10-16 LAB — GLUCOSE, CAPILLARY: Glucose-Capillary: 176 mg/dL — ABNORMAL HIGH (ref 70–99)

## 2020-10-16 SURGERY — ECHOCARDIOGRAM, TRANSESOPHAGEAL
Anesthesia: General

## 2020-10-16 MED ORDER — PROPOFOL 500 MG/50ML IV EMUL
INTRAVENOUS | Status: DC | PRN
  Administered 2020-10-16: 200 ug/kg/min via INTRAVENOUS

## 2020-10-16 MED ORDER — SODIUM CHLORIDE 0.9 % IV SOLN: INTRAVENOUS | Status: DC

## 2020-10-16 MED ORDER — LIDOCAINE HCL (CARDIAC) PF 100 MG/5ML IV SOSY
PREFILLED_SYRINGE | INTRAVENOUS | Status: DC | PRN
  Administered 2020-10-16: 30 mg via INTRAVENOUS

## 2020-10-16 MED ORDER — BUTAMBEN-TETRACAINE-BENZOCAINE 2-2-14 % EX AERO
INHALATION_SPRAY | CUTANEOUS | Status: DC | PRN
  Administered 2020-10-16: 2 via TOPICAL

## 2020-10-16 MED ORDER — PROPOFOL 10 MG/ML IV BOLUS
INTRAVENOUS | Status: DC | PRN
  Administered 2020-10-16: 20 mg via INTRAVENOUS

## 2020-10-16 NOTE — Interval H&P Note (Signed)
History and Physical Interval Note:  10/16/2020 9:57 AM  Manus Gunning  has presented today for surgery, with the diagnosis of AFIB.  The various methods of treatment have been discussed with the patient and family. After consideration of risks, benefits and other options for treatment, the patient has consented to  Procedure(s): TRANSESOPHAGEAL ECHOCARDIOGRAM (TEE) (N/A) CARDIOVERSION (N/A) as a surgical intervention.  The patient's history has been reviewed, patient examined, no change in status, stable for surgery.  I have reviewed the patient's chart and labs.  Questions were answered to the patient's satisfaction.     Elouise Munroe

## 2020-10-16 NOTE — Transfer of Care (Signed)
Immediate Anesthesia Transfer of Care Note  Patient: Melissa Boyer  Procedure(s) Performed: TRANSESOPHAGEAL ECHOCARDIOGRAM (TEE) CARDIOVERSION BUBBLE STUDY  Patient Location: Endoscopy Unit  Anesthesia Type:MAC  Level of Consciousness: drowsy  Airway & Oxygen Therapy: Patient Spontanous Breathing and Patient connected to face mask oxygen  Post-op Assessment: Report given to RN and Post -op Vital signs reviewed and stable  Post vital signs: Reviewed and stable  Last Vitals:  Vitals Value Taken Time  BP    Temp    Pulse    Resp    SpO2      Last Pain:  Vitals:   10/16/20 0820  TempSrc: Oral  PainSc: 0-No pain         Complications: No notable events documented.

## 2020-10-16 NOTE — Progress Notes (Signed)
  Echocardiogram Echocardiogram Transesophageal has been performed.  Melissa Boyer 10/16/2020, 11:21 AM

## 2020-10-16 NOTE — Anesthesia Postprocedure Evaluation (Signed)
Anesthesia Post Note  Patient: Melissa Boyer  Procedure(s) Performed: TRANSESOPHAGEAL ECHOCARDIOGRAM (TEE) CARDIOVERSION BUBBLE STUDY     Patient location during evaluation: Endoscopy Anesthesia Type: General Level of consciousness: awake and alert Pain management: pain level controlled Vital Signs Assessment: post-procedure vital signs reviewed and stable Respiratory status: spontaneous breathing, nonlabored ventilation, respiratory function stable and patient connected to nasal cannula oxygen Cardiovascular status: blood pressure returned to baseline and stable Postop Assessment: no apparent nausea or vomiting Anesthetic complications: no   No notable events documented.  Last Vitals:  Vitals:   10/16/20 1115 10/16/20 1125  BP: 103/60 126/62  Pulse: (!) 53 (!) 53  Resp: 14 15  Temp:    SpO2: 95% 97%    Last Pain:  Vitals:   10/16/20 1125  TempSrc:   PainSc: 0-No pain                 Melissa Boyer L Esha Fincher

## 2020-10-16 NOTE — Anesthesia Procedure Notes (Signed)
Procedure Name: MAC Date/Time: 10/16/2020 10:26 AM Performed by: Lieutenant Diego, CRNA Pre-anesthesia Checklist: Patient identified, Emergency Drugs available, Suction available, Patient being monitored and Timeout performed Patient Re-evaluated:Patient Re-evaluated prior to induction Oxygen Delivery Method: Simple face mask Preoxygenation: Pre-oxygenation with 100% oxygen Induction Type: IV induction

## 2020-10-16 NOTE — Telephone Encounter (Signed)
Left message on patients voicemail to please return our call.   

## 2020-10-16 NOTE — Telephone Encounter (Signed)
-----   Message from Berniece Salines, DO sent at 10/15/2020  9:27 AM EDT ----- Labs stable

## 2020-10-16 NOTE — CV Procedure (Signed)
INDICATIONS: Afib  PROCEDURE:   Informed consent was obtained prior to the procedure. The risks, benefits and alternatives for the procedure were discussed and the patient comprehended these risks.  Risks include, but are not limited to, cough, sore throat, vomiting, nausea, somnolence, esophageal and stomach trauma or perforation, bleeding, low blood pressure, aspiration, pneumonia, infection, trauma to the teeth and death.    After a procedural time-out, the oropharynx was anesthetized with 20% benzocaine spray.   During this procedure the patient was administered propofol per anesthesia.  The patient's heart rate, blood pressure, and oxygen saturation were monitored continuously during the procedure. The period of conscious sedation was 30 minutes, of which I was present face-to-face 100% of this time.  The transesophageal probe was inserted in the esophagus and stomach without difficulty and multiple views were obtained.  The patient was kept under observation until the patient left the procedure room.  The patient left the procedure room in stable condition.   Agitated microbubble saline contrast was administered.  COMPLICATIONS:    There were no immediate complications.  FINDINGS:   FORMAL ECHOCARDIOGRAM REPORT PENDING No LA appendage thrombus  RECOMMENDATIONS:     Proceed to cardioversion as indicated.  Procedure: Electrical Cardioversion Indications:  Atrial Fibrillation  Procedure Details:  Consent: Risks of procedure as well as the alternatives and risks of each were explained to the (patient/caregiver).  Consent for procedure obtained.  Time Out: Verified patient identification, verified procedure, site/side was marked, verified correct patient position, special equipment/implants available, medications/allergies/relevent history reviewed, required imaging and test results available. PERFORMED.  Patient placed on cardiac monitor, pulse oximetry, supplemental oxygen as  necessary.  Sedation given:  propofol per anesthesia Pacer pads placed anterior and posterior chest.  Cardioverted 2 time(s).  Cardioversion with synchronized biphasic 120J, 200J shock.  Evaluation: Findings: Post procedure EKG shows: NSR Complications: None Patient did tolerate procedure well.  Time Spent Directly with the Patient:  60 minutes   Melissa Boyer 10/16/2020, 11:07 AM

## 2020-10-16 NOTE — Anesthesia Preprocedure Evaluation (Addendum)
Anesthesia Evaluation  Patient identified by MRN, date of birth, ID band Patient awake    Reviewed: Allergy & Precautions, NPO status , Patient's Chart, lab work & pertinent test results, reviewed documented beta blocker date and time   Airway Mallampati: III  TM Distance: >3 FB Neck ROM: Full    Dental  (+) Chipped, Dental Advisory Given,    Pulmonary sleep apnea and Continuous Positive Airway Pressure Ventilation , former smoker,    Pulmonary exam normal breath sounds clear to auscultation       Cardiovascular hypertension, Pt. on medications + CAD and + DOE  Normal cardiovascular exam+ dysrhythmias (on eliquis, last dose this AM) Atrial Fibrillation  Rhythm:Irregular Rate:Normal  TTE 2022 1. Left ventricular ejection fraction, by estimation, is 60 to 65%. The  left ventricle has normal function. The left ventricle has no regional  wall motion abnormalities. Left ventricular diastolic parameters are  indeterminate.  2. Right ventricular systolic function is normal. The right ventricular  size is normal. There is normal pulmonary artery systolic pressure.  3. Left atrial size was mild to moderately dilated.  4. The mitral valve is normal in structure. No evidence of mitral valve  regurgitation. No evidence of mitral stenosis.  5. The aortic valve is normal in structure. Aortic valve regurgitation is  not visualized. No aortic stenosis is present.  6. There is mild dilatation of the ascending aorta, measuring 38 mm.  7. The inferior vena cava is normal in size with greater than 50%  respiratory variability, suggesting right atrial pressure of 3 mmHg.  Heart Cath 2022 Ost Cx lesion is 25% stenosed. Mid LAD lesion is 25% stenosed. The left ventricular systolic function is normal. LV end diastolic pressure is mildly elevated. The left ventricular ejection fraction is 55-65% by visual estimate. There is no aortic valve  stenosis. Ao sat 99%, PA sat 73%, PA pressure 32/12, mean PA 25 mm Hg; mean PCWP 20 mm Hg; CO 5.1 L/min; CI 2.38     Neuro/Psych negative neurological ROS  negative psych ROS   GI/Hepatic negative GI ROS, (+) Hepatitis -, C  Endo/Other  diabetes, Oral Hypoglycemic AgentsHypothyroidism Obese BMI 39  Renal/GU negative Renal ROS  negative genitourinary   Musculoskeletal negative musculoskeletal ROS (+)   Abdominal   Peds  Hematology negative hematology ROS (+)   Anesthesia Other Findings   Reproductive/Obstetrics                            Anesthesia Physical Anesthesia Plan  ASA: 3  Anesthesia Plan: General   Post-op Pain Management:    Induction: Intravenous  PONV Risk Score and Plan: 3 and Propofol infusion and Treatment may vary due to age or medical condition  Airway Management Planned: Natural Airway  Additional Equipment:   Intra-op Plan:   Post-operative Plan:   Informed Consent: I have reviewed the patients History and Physical, chart, labs and discussed the procedure including the risks, benefits and alternatives for the proposed anesthesia with the patient or authorized representative who has indicated his/her understanding and acceptance.     Dental advisory given  Plan Discussed with: CRNA  Anesthesia Plan Comments:         Anesthesia Quick Evaluation

## 2020-10-16 NOTE — Discharge Instructions (Signed)
Electrical Cardioversion  Electrical cardioversion is the delivery of a jolt of electricity to restore a normal rhythm to the heart. A rhythm that is too fast or is not regular keeps the heart from pumping well. In this procedure, sticky patches or metal paddles are placed on the chest to deliver electricity to the heart from a device.  If this information does not answer your questions, please call [facility] office at [phone] to clarify.   Follow these instructions at home: You may have some redness on the skin where the shocks were given.  You may apply over-the-counter hydrocortisone cream or aloe vera to alleviate skin irritation. YOU SHOULD NOT DRIVE, use power tools, machinery or perform tasks that involve climbing or major physical exertion for 24 hours (because of the sedation medicines used during the test).  Take over-the-counter and prescription medicines only as told by your health care provider. Ask your health care provider how to check your pulse. Check it often. Rest for 48 hours after the procedure or as told by your health care provider. Avoid or limit your caffeine use as told by your health care provider. Keep all follow-up visits as told by your health care provider. This is important.  FOLLOW UP:  Please also call with any specific questions about appointments or follow up tests.  TEE  YOU HAD AN CARDIAC PROCEDURE TODAY: Refer to the procedure report and other information in the discharge instructions given to you for any specific questions about what was found during the examination. If this information does not answer your questions, please call [facility] office at [phone] to clarify.   DIET: Your first meal following the procedure should be a light meal and then it is ok to progress to your normal diet. A half-sandwich or bowl of soup is an example of a good first meal. Heavy or fried foods are harder to digest and may make you  feel nauseous or bloated. Drink plenty of fluids but you should avoid alcoholic beverages for 24 hours. If you had a esophageal dilation, please see attached instructions for diet.   ACTIVITY: Your care partner should take you home directly after the procedure. You should plan to take it easy, moving slowly for the rest of the day. You can resume normal activity the day after the procedure however YOU SHOULD NOT DRIVE, use power tools, machinery or perform tasks that involve climbing or major physical exertion for 24 hours (because of the sedation medicines used during the test).   SYMPTOMS TO REPORT IMMEDIATELY: A cardiologist can be reached at any hour. Please call [phone] for any of the following symptoms:  Vomiting of blood or coffee ground material  New, significant abdominal pain  New, significant chest pain or pain under the shoulder blades  Painful or persistently difficult swallowing  New shortness of breath  Black, tarry-looking or red, bloody stools  FOLLOW UP:  Please also call with any specific questions about appointments or follow up tests.     

## 2020-10-17 ENCOUNTER — Telehealth: Payer: Self-pay

## 2020-10-17 NOTE — Telephone Encounter (Signed)
Spoke with patient regarding results and recommendation.  Patient verbalizes understanding and is agreeable to plan of care. Advised patient to call back with any issues or concerns.  

## 2020-10-17 NOTE — Telephone Encounter (Signed)
-----   Message from Berniece Salines, DO sent at 10/15/2020  9:27 AM EDT ----- Labs stable

## 2020-10-18 ENCOUNTER — Encounter (HOSPITAL_COMMUNITY): Payer: Self-pay | Admitting: Internal Medicine

## 2020-10-20 ENCOUNTER — Other Ambulatory Visit: Payer: Self-pay

## 2020-10-20 ENCOUNTER — Encounter: Payer: Self-pay | Admitting: Cardiology

## 2020-10-20 ENCOUNTER — Ambulatory Visit (INDEPENDENT_AMBULATORY_CARE_PROVIDER_SITE_OTHER): Payer: 59 | Admitting: Cardiology

## 2020-10-20 ENCOUNTER — Encounter: Payer: Self-pay | Admitting: Oncology

## 2020-10-20 VITALS — BP 122/64 | HR 61 | Ht 66.5 in | Wt 238.0 lb

## 2020-10-20 DIAGNOSIS — I4819 Other persistent atrial fibrillation: Secondary | ICD-10-CM

## 2020-10-20 NOTE — Progress Notes (Signed)
Electrophysiology Office Note   Date:  10/20/2020   ID:  Melissa Boyer, DOB 1955/06/05, MRN [id number]  PCP:  Cher Nakai, MD  Cardiologist:  Tobb Primary Electrophysiologist:  Airlie Blumenberg Meredith Leeds, MD    Chief Complaint: AF   History of Present Illness: Melissa Boyer is a 65 y.o. female who is being seen today for the evaluation of AF at the request of Tobb, Kardie, DO. Presenting today for electrophysiology evaluation.  She has a history significant for diabetes, hyperlipidemia, atrial fibrillation, sleep apnea, former tobacco abuse, nonobstructive coronary artery disease.  She had a cardiac monitor that showed a 100% atrial fibrillation burden.  She is now status post TEE/cardioversion 10/16/2020.  Unfortunately she has gone back into atrial fibrillation.  Her main symptoms associated with her atrial fibrillation are weakness and fatigue.  She was diagnosed with atrial fibrillation in 2019.  She is unclear whether or not she has been in atrial fibrillation for that whole time or if she has been in and out of atrial fibrillation.  Today, she denies symptoms of palpitations, chest pain, shortness of breath, orthopnea, PND, lower extremity edema, claudication, dizziness, presyncope, syncope, bleeding, or neurologic sequela. The patient is tolerating medications without difficulties.    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   Hypercholesteremia    Hypertension    Left renal mass 12/29/2019   Mixed  hyperlipidemia 02/27/2019   Obesity (BMI 35.0-39.9 without comorbidity) 12/29/2019   Obstructive sleep apnea on CPAP 06/19/2019   Paroxysmal atrial fibrillation ([place]) 02/27/2019   Pure hypercholesterolemia 01/01/2014   Thyroid disease    hypothyroid   Unspecified hypothyroidism 01/01/2014   Past Surgical History:  Procedure Laterality Date   BUBBLE STUDY  10/16/2020   Procedure: BUBBLE STUDY;  Surgeon: Elouise Munroe, MD;  Location: [place];  Service: Cardiovascular;;   CARDIOVERSION N/A 10/16/2020   Procedure: CARDIOVERSION;  Surgeon: Elouise Munroe, MD;  Location: [place];  Service: Cardiovascular;  Laterality: N/A;   COLPOSCOPY  10/98   normal, no biopsy   ENDOMETRIAL ABLATION  12/02/2002   ENDOMETRIAL BIOPSY  5/03   RIGHT/LEFT HEART CATH AND CORONARY ANGIOGRAPHY N/A 07/18/2020   Procedure: RIGHT/LEFT HEART CATH AND CORONARY ANGIOGRAPHY;  Surgeon: Jettie Booze, MD;  Location: [place] [facility];  Service: Cardiovascular;  Laterality: N/A;   TEE WITHOUT CARDIOVERSION N/A 10/16/2020   Procedure: TRANSESOPHAGEAL ECHOCARDIOGRAM (TEE);  Surgeon: Elouise Munroe, MD;  Location: [facility] END[facility];  Service: Cardiovascular;  Laterality: N/A;   THYROIDECTOMY, PARTIAL     thyroid nodule excision left side     Current Outpatient Medications  Medication Sig Dispense Refill   acetaminophen (TYLENOL) 650 MG CR tablet Take 1,300 mg by mouth every 8 (eight) hours as needed for pain.     ALPRAZolam (XANAX) 0.5 MG tablet Take 0.25 mg by mouth daily as needed for anxiety.     amLODipine (NORVASC) 5 MG tablet Take 2.5  mg by mouth at bedtime.     apixaban (ELIQUIS) 5 MG TABS tablet Take 1 tablet (5 mg total) by mouth 2 (two) times daily. 60 tablet 13   aspirin EC 81 MG tablet Take 1 tablet (81 mg total) by mouth daily. Swallow whole. (Patient taking differently: Take 81 mg by mouth once a week. Swallow whole.) 90 tablet 3   atorvastatin (LIPITOR) 20 MG tablet Take 20 mg by mouth daily.      hydrochlorothiazide (HYDRODIURIL) 12.5 MG tablet Take 12.5 mg by mouth daily.     levothyroxine (SYNTHROID, LEVOTHROID) 125 MCG tablet Take 125 mcg by mouth daily before breakfast.     loratadine (CLARITIN) 10 MG tablet Take 10 mg by mouth daily as needed for allergies.     losartan (COZAAR) 100 MG tablet Take 100 mg by mouth daily.     meloxicam (MOBIC) 7.5 MG tablet Take 7.5 mg by mouth every other day.     metFORMIN (GLUCOPHAGE) 500 MG tablet Take 1 tablet (500 mg total) by mouth 2 (two) times daily with a meal.     metoprolol succinate (TOPROL-XL) 100 MG 24 hr tablet Take 100 mg by mouth daily.     nitroGLYCERIN (NITROSTAT) 0.4 MG SL tablet Place 1 tablet (0.4 mg total) under the tongue every 5 (five) minutes as needed for chest pain. 90 tablet 3   triazolam (HALCION) 0.25 MG tablet Take 0.25-0.5 mg by mouth at bedtime.     TRULICITY 1.5 AJ/2.8NO SOPN Inject 1.5 mg into the skin every Monday.     No current facility-administered medications for this visit.    Allergies:   Patient has no known allergies.   Social History:  The patient  reports that she quit smoking about 17 years ago. Her smoking use included cigarettes. She has a 30.00 pack-year smoking history. She has never used smokeless tobacco. She reports that she does not drink alcohol and does not use drugs.   Family History:  The patient's family history includes Cancer in her maternal aunt, mother, and sister; Diabetes in her maternal grandfather; Emphysema in her father; Heart attack in her maternal aunt and maternal grandmother; Hypertension in her father.    ROS:  Please see the history of present illness.   Otherwise, review of systems is positive for none.   All other systems are reviewed and negative.    PHYSICAL EXAM: VS:  BP 122/64   Pulse 61   Ht 5' 6.5" (1.689 m)   Wt 238 lb (108 kg)   LMP 12/02/2002 (Approximate)   BMI 37.84 kg/m  , BMI Body mass index is 37.84 kg/m. GEN: Well nourished, well developed, in  no acute distress  HEENT: normal  Neck: no JVD, carotid bruits, or masses Cardiac: irregular; no murmurs, rubs, or gallops,no edema  Respiratory:  clear to auscultation bilaterally, normal work of breathing GI: soft, nontender, nondistended, + BS MS: no deformity or atrophy  Skin: warm and dry Neuro:  Strength and sensation are intact Psych: euthymic mood, full affect  EKG:  EKG is ordered today. Personal review of the ekg ordered shows atrial fibrillation, rate 61  Recent Labs: 10/14/2020: BUN 17; Creatinine, Ser 0.78; Hemoglobin 15.6; Magnesium 2.0; Platelets 249; Potassium 4.0; Sodium 141    Lipid Panel  No results found for: CHOL, TRIG, HDL, CHOLHDL, VLDL, LDLCALC, LDLDIRECT   Wt Readings from Last 3 Encounters:  10/20/20 238 lb (108 kg)  10/16/20 235 lb (106.6 kg)  10/09/20 238 lb 9.6 oz (  108.2 kg)      Other studies Reviewed: Additional studies/ records that were reviewed today include: TTE 07/28/20  Review of the above records today demonstrates:   1. Left ventricular ejection fraction, by estimation, is 60 to 65%. The  left ventricle has normal function. The left ventricle has no regional  wall motion abnormalities. Left ventricular diastolic parameters are  indeterminate.   2. Right ventricular systolic function is normal. The right ventricular  size is normal. There is normal pulmonary artery systolic pressure.   3. Left atrial size was mild to moderately dilated.   4. The mitral valve is normal in structure. No evidence of mitral valve  regurgitation. No evidence of mitral stenosis.   5. The aortic valve is normal in structure. Aortic valve regurgitation is  not visualized. No aortic stenosis is present.   6. There is mild dilatation of the ascending aorta, measuring 38 mm.   7. The inferior vena cava is normal in size with greater than 50%  respiratory variability, suggesting right atrial pressure of 3 mmHg.   Cardiac monitor 09/02/2020 personally  reviewed Atrial Fibrillation occurred continuously (100% burden), ranging from 39-151 bpm (avg of 65 bpm).   Premature ventricular complexes were frequent (5.2%, [id number]). Ventricular Bigeminy and Trigeminy were present.   18 patient triggered events in 26 diary events were associated with the atrial fibrillation and premature ventricular complex.  RHC/LHC 07/18/20 Ost Cx lesion is 25% stenosed. Mid LAD lesion is 25% stenosed. The left ventricular systolic function is normal. LV end diastolic pressure is mildly elevated. The left ventricular ejection fraction is 55-65% by visual estimate. There is no aortic valve stenosis. Ao sat 99%, PA sat 73%, PA pressure 32/12, mean PA 25 mm Hg; mean PCWP 20 mm Hg; CO 5.1 L/min; CI 2.38  ASSESSMENT AND PLAN:  1.  Persistent atrial fibrillation: Recent cardiac monitor showed a 100% atrial fibrillation burden.  She is status post TEE and cardioversion 10/16/2020.  Currently on Eliquis.  CHA2DS2-VASc of 4.  She had a cardioversion but unfortunately went back into atrial fibrillation.  She has symptoms of weakness and fatigue.  We talked about ablation versus medication management.  She would like to avoid invasive procedures.  We Melissa Boyer plan for admission for dofetilide loading.  2.  Obstructive sleep apnea: CPAP compliance encouraged  3.  Hypertension: Currently well controlled  4.  Obesity: Diet and exercise encouraged  Case discussed with primary cardiology  Current medicines are reviewed at length with the patient today.   The patient does not have concerns regarding her medicines.  The following changes were made today:  none  Labs/ tests ordered today include:  Orders Placed This Encounter  Procedures   EKG 12-Lead      Disposition:   FU with Melissa Boyer 3 months  Signed, Melissa Boyer Meredith Leeds, MD  10/20/2020 12:31 PM     [place] [address] [id number] [phone] (office) [phone]  (fax)

## 2020-10-20 NOTE — Patient Instructions (Addendum)
Medication Instructions:  Your physician recommends that you continue on your current medications as directed. Please refer to the Current Medication list given to you today.  *If you need a refill on your cardiac medications before your next appointment, please call your pharmacy*   Lab Work: None ordered If you have labs (blood work) drawn today and your tests are completely normal, you will receive your results only by: [place] (if you have MyChart) OR A paper copy in the mail If you have any lab test that is abnormal or we need to change your treatment, we will call you to review the results.   Testing/Procedures: None ordered   Follow-Up: At [facility], you and your health needs are our priority.  As part of our continuing mission to provide you with exceptional heart care, we have created designated Provider Care Teams.  These Care Teams include your primary Cardiologist (physician) and Advanced Practice Providers (APPs -  Physician Assistants and Nurse Practitioners) who all work together to provide you with the care you need, when you need it.  We recommend signing up for the patient portal called "MyChart".  Sign up information is provided on this After Visit Summary.  MyChart is used to connect with patients for Virtual Visits (Telemedicine).  Patients are able to view lab/test results, encounter notes, upcoming appointments, etc.  Non-urgent messages can be sent to your provider as well.   To learn more about what you can do with MyChart, go to [email or web address].    Your next appointment:     to be determined  The format for your next appointment:   In Person  Provider:   Allegra Lai, MD    Thank you for choosing [facility]!!   Trinidad Curet, RN [phone]   Other Instructions   Preparing for Tikosyn (Dofetilide) Admission  1) Check with insurance company for cost of drug to ensure affordability.  Dofetilide 500 mcg twice a day  2)  Tikosyn admission requires a 3 day hospital stay with constant telemetry monitoring. You will have and EKG after each dose of Tikosyn.  If the drug does not convert you to normal rhythm a cardioversion will be performed prior to discharge.  3) A pharmacist will review all of your medications.  If there are any interactions noted we will be in touch with you.  4) Please ensure no missed doses of your anticoagulation (blood thinner) for the past 3 weeks prior to admission.  5) No Benadryl is allowed 3 days prior to admission.  6) On day of admission - you will come in for office visit in the [facility] where lab work will be drawn.  IF the labs are acceptable an inpatient bed will be requested.  7) Normally admission to a bed is straight from our office.  This is all dependent on bed availability.  In some instances you may be sent home and bed placement will call later in the day when a bed is available.  8) You may bring personal belongings/clothing with you to the hospital.  Please leave your suitcase in the care until you arrive in admissions.  Questions - please call the office at [phone]     Dofetilide capsules What is this medication? DOFETILIDE (doe FET il ide) is an antiarrhythmic drug. It helps make your heartbeat regularly. This medicine also helps to slow rapid heartbeats. This medicine may be used for other purposes; ask your health care provider orpharmacist if you have questions. COMMON  BRAND NAME(S): Tikosyn What should I tell my care team before I take this medication? They need to know if you have any of these conditions: heart disease history of irregular heartbeat history of low levels of potassium or magnesium in the blood kidney disease liver disease an unusual or allergic reaction to dofetilide, other medicines, foods, dyes, or preservatives pregnant or trying to get pregnant breast-feeding How should I use this medication? Take this medicine by mouth  with a glass of water. Follow the directions on the prescription label. Do not take with grapefruit juice. You can take it with or without food. If it upsets your stomach, take it with food. Take your medicine at regular intervals. Do not take it more often than directed. Do not stoptaking except on your doctor's advice. A special MedGuide will be given to you by the pharmacist with eachprescription and refill. Be sure to read this information carefully each time. Talk to your pediatrician regarding the use of this medicine in children.Special care may be needed. Overdosage: If you think you have taken too much of this medicine contact apoison control center or emergency room at once. NOTE: This medicine is only for you. Do not share this medicine with others. What if I miss a dose? If you miss a dose, skip it. Take your next dose at the normal time. Do nottake extra or 2 doses at the same time to make up for the missed dose. What may interact with this medication? Do not take this medicine with any of the following medications: cimetidine cisapride dolutegravir dronedarone erdafitinib hydrochlorothiazide ketoconazole megestrol pimozide prochlorperazine thioridazine trimethoprim verapamil This medicine may also interact with the following medications: amiloride cannabinoids certain antibiotics like erythromycin or clarithromycin certain antiviral medicines for HIV or hepatitis certain medicines for depression, anxiety, or psychotic disorders digoxin diltiazem grapefruit juice metformin nefazodone other medicines that prolong the QT interval (an abnormal heart rhythm) quinine triamterene zafirlukast ziprasidone This list may not describe all possible interactions. Give your health care provider a list of all the medicines, herbs, non-prescription drugs, or dietary supplements you use. Also tell them if you smoke, drink alcohol, or use illegaldrugs. Some items may interact with  your medicine. What should I watch for while using this medication? Your condition will be monitored carefully while you are receiving thismedicine. What side effects may I notice from receiving this medication? Side effects that you should report to your doctor or health care professionalas soon as possible: allergic reactions like skin rash, itching or hives, swelling of the face, lips, or tongue breathing problems chest pain or chest tightness dizziness signs and symptoms of a dangerous change in heartbeat or heart rhythm like chest pain; dizziness; fast or irregular heartbeat; palpitations; feeling faint or lightheaded, falls; breathing problems signs and symptoms of electrolyte imbalance like severe diarrhea, unusual sweating, vomiting, loss of appetite, increased thirst swelling of the ankles, legs, or feet tingling, numbness in the hands or feet Side effects that usually do not require medical attention (report to yourdoctor or health care professional if they continue or are bothersome): diarrhea general ill feeling or flu-like symptoms headache nausea trouble sleeping stomach pain This list may not describe all possible side effects. Call your doctor for medical advice about side effects. You may report side effects to FDA at1-800-FDA-1088. Where should I keep my medication? Keep out of the reach of children. Store at room temperature between 15 and 30 degrees C (59 and 86 degrees F).Throw away any unused medicine after  the expiration date. NOTE: This sheet is a summary. It may not cover all possible information. If you have questions about this medicine, talk to your doctor, pharmacist, orhealth care provider.  2022 [employer]vier/Gold Standard (2019-03-20 12:14:05)

## 2020-10-21 ENCOUNTER — Other Ambulatory Visit: Payer: Self-pay | Admitting: Oncology

## 2020-10-21 DIAGNOSIS — N2889 Other specified disorders of kidney and ureter: Secondary | ICD-10-CM

## 2020-10-22 ENCOUNTER — Telehealth: Payer: Self-pay | Admitting: Cardiology

## 2020-10-22 NOTE — Telephone Encounter (Signed)
Follow up:     Patient returning a calling concering a apt with the AFIB clinic.

## 2020-10-23 ENCOUNTER — Encounter (HOSPITAL_COMMUNITY): Payer: Self-pay

## 2020-10-23 ENCOUNTER — Telehealth: Payer: Self-pay | Admitting: Pharmacist

## 2020-10-23 NOTE — Telephone Encounter (Signed)
Medication list reviewed in anticipation of upcoming Tikosyn initiation. Patient is taking HCTZ which is contraindicated  with Tikosyn and will need to be discontinued at least 3 days prior to Tikosyn initiation.  Patient is anticoagulated on Eliquis 5mg  BID on the appropriate dose. Please ensure that patient has not missed any anticoagulation doses in the 3 weeks prior to Tikosyn initiation.   Patient will need to be counseled to avoid use of Benadryl while on Tikosyn and in the 2-3 days prior to Tikosyn initiation.

## 2020-10-23 NOTE — Telephone Encounter (Signed)
Pt spoke to insurance and is ready to proceed with Tikosyn adx. Pt aware AF[facility] clinic staff will contact her w/ information/appt. Patient verbalized understanding and agreeable to plan.

## 2020-10-24 NOTE — Telephone Encounter (Signed)
Pt scheduled for August 1st admission. Pt instructed to stop HCTZ 3 days prior to admission.

## 2020-10-27 ENCOUNTER — Ambulatory Visit (INDEPENDENT_AMBULATORY_CARE_PROVIDER_SITE_OTHER): Payer: 59 | Admitting: Cardiology

## 2020-10-27 ENCOUNTER — Encounter: Payer: Self-pay | Admitting: Cardiology

## 2020-10-27 ENCOUNTER — Other Ambulatory Visit: Payer: Self-pay

## 2020-10-27 VITALS — BP 142/80 | HR 59 | Ht 66.0 in | Wt 240.6 lb

## 2020-10-27 DIAGNOSIS — I4819 Other persistent atrial fibrillation: Secondary | ICD-10-CM

## 2020-10-27 DIAGNOSIS — I10 Essential (primary) hypertension: Secondary | ICD-10-CM | POA: Diagnosis not present

## 2020-10-27 DIAGNOSIS — E1100 Type 2 diabetes mellitus with hyperosmolarity without nonketotic hyperglycemic-hyperosmolar coma (NKHHC): Secondary | ICD-10-CM | POA: Diagnosis not present

## 2020-10-27 DIAGNOSIS — I2510 Atherosclerotic heart disease of native coronary artery without angina pectoris: Secondary | ICD-10-CM

## 2020-10-27 DIAGNOSIS — E669 Obesity, unspecified: Secondary | ICD-10-CM

## 2020-10-27 NOTE — Patient Instructions (Signed)
Medication Instructions:   Your physician recommends that you continue on your current medications as directed. Please refer to the Current Medication list given to you today.  *If you need a refill on your cardiac medications before your next appointment, please call your pharmacy*   Lab Work:  None If you have labs (blood work) drawn today and your tests are completely normal, you will receive your results only by: [place] (if you have MyChart) OR A paper copy in the mail If you have any lab test that is abnormal or we need to change your treatment, we will call you to review the results.   Testing/Procedures:  None   Follow-Up: At [facility], you and your health needs are our priority.  As part of our continuing mission to provide you with exceptional heart care, we have created designated Provider Care Teams.  These Care Teams include your primary Cardiologist (physician) and Advanced Practice Providers (APPs -  Physician Assistants and Nurse Practitioners) who all work together to provide you with the care you need, when you need it.  We recommend signing up for the patient portal called "MyChart".  Sign up information is provided on this After Visit Summary.  MyChart is used to connect with patients for Virtual Visits (Telemedicine).  Patients are able to view lab/test results, encounter notes, upcoming appointments, etc.  Non-urgent messages can be sent to your provider as well.   To learn more about what you can do with MyChart, go to [email or web address].    Your next appointment:   6 month(s)  The format for your next appointment:   In Person  Provider:   [address]   - Berniece Salines, DO   Other Instructions

## 2020-10-27 NOTE — Progress Notes (Signed)
Cardiology Office Note:    Date:  10/27/2020   ID:  Melissa Boyer, DOB 1955/10/26, MRN [id number]  PCP:  Cher Nakai, MD  Cardiologist:  Berniece Salines, DO  Electrophysiologist:  None   Referring MD: Cher Nakai, MD   No chief complaint on file.   History of Present Illness:    Melissa Boyer is a 65 y.o. female with a hx of  hx of diabetes mellitus, hyperlipidemia, atrial fibrillation on Eliquis which was diagnosed in 2019, sleep apnea on CPAP, hypothyroidism, former smoker, coronary artery disease nonobstructive per her recent cardiac catheterization.    In the interim I sent the patient for TEE cardioversion she converted to sinus rhythm however unfortunately was back in atrial fibrillation soon after.  She did see EP and right now the plan is for her to get loaded with Tikosyn in the hospital.  She is here today for follow-up visit.   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   Hypercholesteremia    Hypertension    Left renal mass 12/29/2019   Mixed hyperlipidemia 02/27/2019   Obesity (BMI 35.0-39.9 without comorbidity) 12/29/2019   Obstructive sleep apnea on CPAP 06/19/2019   Paroxysmal atrial fibrillation ([place]) 02/27/2019   Pure hypercholesterolemia 01/01/2014   Thyroid disease    hypothyroid   Unspecified hypothyroidism 01/01/2014    Past Surgical History:  Procedure Laterality Date   BUBBLE STUDY  10/16/2020   Procedure: BUBBLE STUDY;  Surgeon: Elouise Munroe, MD;  Location: [place];   Service: Cardiovascular;;   CARDIOVERSION N/A 10/16/2020   Procedure: CARDIOVERSION;  Surgeon: Elouise Munroe, MD;  Location: [place];  Service: Cardiovascular;  Laterality: N/A;   COLPOSCOPY  10/98   normal, no biopsy   ENDOMETRIAL ABLATION  12/02/2002   ENDOMETRIAL BIOPSY  5/03   RIGHT/LEFT HEART CATH AND CORONARY ANGIOGRAPHY N/A 07/18/2020   Procedure: RIGHT/LEFT HEART CATH AND CORONARY ANGIOGRAPHY;  Surgeon: Jettie Booze, MD;  Location: [place] [facility];  Service: Cardiovascular;  Laterality: N/A;   TEE WITHOUT CARDIOVERSION N/A 10/16/2020   Procedure: TRANSESOPHAGEAL ECHOCARDIOGRAM (TEE);  Surgeon: Elouise Munroe, MD;  Location: [facility] ENDOSCOPY;  Service: Cardiovascular;  Laterality: N/A;   THYROIDECTOMY, PARTIAL     thyroid nodule excision left side    Current Medications: Current Meds  Medication Sig   acetaminophen (TYLENOL) 650 MG CR tablet Take 1,300 mg by mouth every 8 (eight) hours as needed for pain.   ALPRAZolam (XANAX) 0.5 MG tablet Take 0.25 mg by mouth daily as needed for anxiety.   amLODipine (NORVASC) 5 MG tablet Take 2.5 mg by mouth at bedtime.   apixaban (ELIQUIS) 5 MG TABS tablet Take 1 tablet (5 mg total) by mouth 2 (two) times daily.   aspirin EC 81 MG tablet Take 1 tablet (81 mg total) by mouth daily. Swallow whole. (Patient taking differently: Take 81 mg by mouth once a week. Swallow whole.)   atorvastatin (  LIPITOR) 20 MG tablet Take 20 mg by mouth daily.   hydrochlorothiazide (HYDRODIURIL) 12.5 MG tablet Take 12.5 mg by mouth daily.   levothyroxine (SYNTHROID, LEVOTHROID) 125 MCG tablet Take 125 mcg by mouth daily before breakfast.   loratadine (CLARITIN) 10 MG tablet Take 10 mg by mouth daily as needed for allergies.   losartan (COZAAR) 100 MG tablet Take 100 mg by mouth daily.   meloxicam (MOBIC) 7.5 MG tablet Take 7.5 mg by mouth every other day.   metFORMIN (GLUCOPHAGE) 500 MG tablet Take 1 tablet (500 mg total) by mouth 2 (two) times daily  with a meal.   metoprolol succinate (TOPROL-XL) 100 MG 24 hr tablet Take 100 mg by mouth daily.   triazolam (HALCION) 0.25 MG tablet Take 0.25-0.5 mg by mouth at bedtime.   TRULICITY 1.5 RS/8.5IO SOPN Inject 1.5 mg into the skin every Monday.     Allergies:   Patient has no known allergies.   Social History   Socioeconomic History   Marital status: Divorced    Spouse name: Not on file   Number of children: 1   Years of education: Not on file   Highest education level: Not on file  Occupational History   Not on file  Tobacco Use   Smoking status: Former    Packs/day: 1.00    Years: 30.00    Pack years: 30.00    Types: Cigarettes    Quit date: 07/12/2003    Years since quitting: 17.3   Smokeless tobacco: Never  Vaping Use   Vaping Use: Never used  Substance and Sexual Activity   Alcohol use: Never   Drug use: No   Sexual activity: Yes    Partners: Male    Birth control/protection: Post-menopausal  Other Topics Concern   Not on file  Social History Narrative   Not on file   Social Determinants of Health   Financial Resource Strain: Not on file  Food Insecurity: Not on file  Transportation Needs: Not on file  Physical Activity: Not on file  Stress: Not on file  Social Connections: Not on file     Family History: The patient's family history includes Cancer in her maternal aunt, mother, and sister; Diabetes in her maternal grandfather; Emphysema in her father; Heart attack in her maternal aunt and maternal grandmother; Hypertension in her father.  ROS:   Review of Systems  Constitution: Negative for decreased appetite, fever and weight gain.  HENT: Negative for congestion, ear discharge, hoarse voice and sore throat.   Eyes: Negative for discharge, redness, vision loss in right eye and visual halos.  Cardiovascular: Negative for chest pain, dyspnea on exertion, leg swelling, orthopnea and palpitations.  Respiratory: Negative for cough, hemoptysis, shortness of  breath and snoring.   Endocrine: Negative for heat intolerance and polyphagia.  Hematologic/Lymphatic: Negative for bleeding problem. Does not bruise/bleed easily.  Skin: Negative for flushing, nail changes, rash and suspicious lesions.  Musculoskeletal: Negative for arthritis, joint pain, muscle cramps, myalgias, neck pain and stiffness.  Gastrointestinal: Negative for abdominal pain, bowel incontinence, diarrhea and excessive appetite.  Genitourinary: Negative for decreased libido, genital sores and incomplete emptying.  Neurological: Negative for brief paralysis, focal weakness, headaches and loss of balance.  Psychiatric/Behavioral: Negative for altered mental status, depression and suicidal ideas.  Allergic/Immunologic: Negative for HIV exposure and persistent infections.    EKGs/Labs/Other Studies Reviewed:    The following studies were reviewed today:   EKG: None today  Transthoracic echocardiogram July 28, 2020 IMPRESSIONS  1. Left ventricular ejection fraction, by estimation, is 60 to 65%. The left ventricle has normal function. The left ventricle has no regional wall motion abnormalities. Left ventricular diastolic parameters are indeterminate.   2. Right ventricular systolic function is normal. The right ventricular size is normal. There is normal pulmonary artery systolic pressure.   3. Left atrial size was mild to moderately dilated.   4. The mitral valve is normal in structure. No evidence of mitral valve regurgitation. No evidence of mitral stenosis.   5. The aortic valve is normal in structure. Aortic valve regurgitation is not visualized. No aortic stenosis is present.   6. There is mild dilatation of the ascending aorta, measuring 38 mm.  7. The inferior vena cava is normal in size with greater than 50%  respiratory variability, suggesting right atrial pressure of 3 mmHg.   FINDINGS   Left Ventricle: Left ventricular ejection fraction, by estimation, is 60  to 65%.  The left ventricle has normal function. The left ventricle has no  regional wall motion abnormalities. The left ventricular internal cavity  size was normal in size. There is   no left ventricular hypertrophy. Left ventricular diastolic parameters  are indeterminate.   Right Ventricle: The right ventricular size is normal. No increase in  right ventricular wall thickness. Right ventricular systolic function is  normal. There is normal pulmonary artery systolic pressure. The tricuspid  regurgitant velocity is 2.18 m/s, and   with an assumed right atrial pressure of 8 mmHg, the estimated right  ventricular systolic pressure is 54.2 mmHg.   Left Atrium: Left atrial size was mild to moderately dilated.   Right Atrium: Right atrial size was normal in size.   Pericardium: There is no evidence of pericardial effusion.   Mitral Valve: The mitral valve is normal in structure. No evidence of  mitral valve regurgitation. No evidence of mitral valve stenosis.   Tricuspid Valve: The tricuspid valve is normal in structure. Tricuspid  valve regurgitation is mild . No evidence of tricuspid stenosis.   Aortic Valve: The aortic valve is normal in structure. Aortic valve  regurgitation is not visualized. No aortic stenosis is present.   Pulmonic Valve: The pulmonic valve was normal in structure. Pulmonic valve  regurgitation is not visualized. No evidence of pulmonic stenosis.   Aorta: The aortic root is normal in size and structure. There is mild  dilatation of the ascending aorta, measuring 38 mm.   Venous: The inferior vena cava is normal in size with greater than 50%  respiratory variability, suggesting right atrial pressure of 3 mmHg.   IAS/Shunts: No atrial level shunt detected by color flow Doppler.       ZIO monitor Patch Wear Time:  13 days and 13 hours starting August 06, 2020. Indication: Paroxysmal atrial fibrillation   Atrial Fibrillation occurred continuously (100% burden),  ranging from 39-151 bpm (avg of 65 bpm).   Premature ventricular complexes were frequent (5.2%, [id number]). Ventricular Bigeminy and Trigeminy were present.   18 patient triggered events in 26 diary events were associated with the atrial fibrillation and premature ventricular complex.     Conclusion: Symptomatic atrial fibrillation and symptomatic frequent premature ventricular complex  LHC July 18, 2020 Ost Cx lesion is 25% stenosed. Mid LAD lesion is 25% stenosed. The left ventricular systolic function is normal. LV end diastolic pressure is mildly elevated. The left ventricular ejection fraction is 55-65% by visual estimate. There is no aortic valve stenosis. Ao sat 99%, PA sat 73%, PA  pressure 32/12, mean PA 25 mm Hg; mean PCWP 20 mm Hg; CO 5.1 L/min; CI 2.38  Continue preventive therapy.  Resume Eliquis tomorrow. Resume metformin on 07/20/20.  Further plan per Dr. Harriet Masson.    Recent Labs: 10/14/2020: BUN 17; Creatinine, Ser 0.78; Hemoglobin 15.6; Magnesium 2.0; Platelets 249; Potassium 4.0; Sodium 141  Recent Lipid Panel No results found for: CHOL, TRIG, HDL, CHOLHDL, VLDL, LDLCALC, LDLDIRECT  Physical Exam:    VS:  BP (!) 142/80   Pulse (!) 59   Ht 5\' 6"  (1.676 m)   Wt 240 lb 9.6 oz (109.1 kg)   LMP 12/02/2002 (Approximate)   SpO2 98%   BMI 38.83 kg/m     Wt Readings from Last 3 Encounters:  10/27/20 240 lb 9.6 oz (109.1 kg)  10/20/20 238 lb (108 kg)  10/16/20 235 lb (106.6 kg)     GEN: Well nourished, well developed in no acute distress HEENT: Normal NECK: No JVD; No carotid bruits LYMPHATICS: No lymphadenopathy CARDIAC: S1S2 noted,RRR, no murmurs, rubs, gallops RESPIRATORY:  Clear to auscultation without rales, wheezing or rhonchi  ABDOMEN: Soft, non-tender, non-distended, +bowel sounds, no guarding. EXTREMITIES: No edema, No cyanosis, no clubbing MUSCULOSKELETAL:  No deformity  SKIN: Warm and dry NEUROLOGIC:  Alert and oriented x 3, non-focal PSYCHIATRIC:  Normal  affect, good insight  ASSESSMENT:    1. Persistent atrial fibrillation ([place])   2. Essential hypertension   3. Hypertension, unspecified type   4. Mild CAD   5. Type 2 diabetes mellitus with hyperosmolarity without coma, without long-term current use of insulin (H[facility])   6. Obesity (BMI 35.0-39.9 without comorbidity)    PLAN:     She is back in atrial fibrillation, she did see EP and there is plans for Tikosyn loading.  She has some questions about this process and I was able to explain it to her again as well.  She will continue on her Eliquis 5 mg daily.  She is going to stop her hydrochlorothiazide 12.53 days prior to her Tikosyn load.  We will follow-up her blood pressure after this and adjust medication as appropriate.  No anginal symptoms. In terms of her sleep apnea she has a sleep study to help with adjusting her CPAP setting. This is being managed by his primary care doctor.  No adjustments for antidiabetic medications were made today. The patient understands the need to lose weight with diet and exercise. We have discussed specific strategies for this.   The patient is in agreement with the above plan. The patient left the office in stable condition.  The patient will follow up in 6 months or sooner if needed.   Medication Adjustments/Labs and Tests Ordered: Current medicines are reviewed at length with the patient today.  Concerns regarding medicines are outlined above.  No orders of the defined types were placed in this encounter.  No orders of the defined types were placed in this encounter.   Patient Instructions  Medication Instructions:   Your physician recommends that you continue on your current medications as directed. Please refer to the Current Medication list given to you today.  *If you need a refill on your cardiac medications before your next appointment, please call your pharmacy*   Lab Work:  None If you have labs (blood work) drawn today and your  tests are completely normal, you will receive your results only by: [place] (if you have MyChart) OR A paper copy in the mail If you have any lab test  that is abnormal or we need to change your treatment, we will call you to review the results.   Testing/Procedures:  None   Follow-Up: At [facility], you and your health needs are our priority.  As part of our continuing mission to provide you with exceptional heart care, we have created designated Provider Care Teams.  These Care Teams include your primary Cardiologist (physician) and Advanced Practice Providers (APPs -  Physician Assistants and Nurse Practitioners) who all work together to provide you with the care you need, when you need it.  We recommend signing up for the patient portal called "MyChart".  Sign up information is provided on this After Visit Summary.  MyChart is used to connect with patients for Virtual Visits (Telemedicine).  Patients are able to view lab/test results, encounter notes, upcoming appointments, etc.  Non-urgent messages can be sent to your provider as well.   To learn more about what you can do with MyChart, go to [email or web address].    Your next appointment:   6 month(s)  The format for your next appointment:   In Person  Provider:   [address]   - Berniece Salines, DO   Other Instructions    Adopting a Healthy Lifestyle.  Know what a healthy weight is for you (roughly BMI <25) and aim to maintain this   Aim for 7+ servings of fruits and vegetables daily   65-80+ fluid ounces of water or unsweet tea for healthy kidneys   Limit to max 1 drink of alcohol per day; avoid smoking/tobacco   Limit animal fats in diet for cholesterol and heart health - choose grass fed whenever available   Avoid highly processed foods, and foods high in saturated/trans fats   Aim for low stress - take time to unwind and care for your mental health   Aim for 150 min of moderate intensity exercise  weekly for heart health, and weights twice weekly for bone health   Aim for 7-9 hours of sleep daily   When it comes to diets, agreement about the perfect plan isnt easy to find, even among the experts. Experts at the [employer] developed an idea known as the Healthy Eating Plate. Just imagine a plate divided into logical, healthy portions.   The emphasis is on diet quality:   Load up on vegetables and fruits - one-half of your plate: Aim for color and variety, and remember that potatoes dont count.   Go for whole grains - one-quarter of your plate: Whole wheat, barley, wheat berries, quinoa, oats, brown rice, and foods made with them. If you want pasta, go with whole wheat pasta.   Protein power - one-quarter of your plate: Fish, chicken, beans, and nuts are all healthy, versatile protein sources. Limit red meat.   The diet, however, does go beyond the plate, offering a few other suggestions.   Use healthy plant oils, such as olive, canola, soy, corn, sunflower and peanut. Check the labels, and avoid partially hydrogenated oil, which have unhealthy trans fats.   If youre thirsty, drink water. Coffee and tea are good in moderation, but skip sugary drinks and limit milk and dairy products to one or two daily servings.   The type of carbohydrate in the diet is more important than the amount. Some sources of carbohydrates, such as vegetables, fruits, whole grains, and beans-are healthier than others.   Finally, stay active  Rolly Pancake, DO  10/27/2020 4:39 PM    [facility]

## 2020-10-30 NOTE — Telephone Encounter (Signed)
Pt notified to stop HCTZ 3 days prior to admission.

## 2020-11-04 ENCOUNTER — Telehealth (HOSPITAL_COMMUNITY): Payer: Self-pay

## 2020-11-04 NOTE — Telephone Encounter (Signed)
Tikosyn Preadmission for 12/01/2020 per [facility] does not require precert.

## 2020-11-05 ENCOUNTER — Encounter: Payer: Self-pay | Admitting: *Deleted

## 2020-11-05 ENCOUNTER — Other Ambulatory Visit: Payer: Self-pay

## 2020-11-05 ENCOUNTER — Ambulatory Visit
Admission: RE | Admit: 2020-11-05 | Discharge: 2020-11-05 | Disposition: A | Discharge status: Home / Self Care | Payer: 59 | Source: Ambulatory Visit | Attending: Oncology | Admitting: Oncology

## 2020-11-05 DIAGNOSIS — N2889 Other specified disorders of kidney and ureter: Secondary | ICD-10-CM

## 2020-11-05 HISTORY — PX: IR RADIOLOGIST EVAL & MGMT: IMG5224

## 2020-11-05 NOTE — Consult Note (Signed)
Chief Complaint: Left sided renal lesions  Referring Physician(s): Lewis,Dequincy A ([facility]) Hemal ([facility]) [facility] (Cardiology)  History of Present Illness:  Melissa Boyer is a 65 y.o. female with past medical history significant for atrial fibrillation (on Eliquis), type 2 diabetes, hyperlipidemia, hypertension, obesity, obstructive sleep apnea, hypothyroidism, former smoker and hepatitis C (posttreatment) who is evaluated today via telemedicine consultation for left-sided renal lesions.  Patient presented to the emergency department in [place] on 11/07/2019 with bloody drainage from the umbilicus at which time CT scan that demonstrated an approximately 1.2 cm lesion involving the superior pole of the left kidney.  Patient was treated with antibiotics and her umbilical pain and bleeding resolved.  She subsequent underwent abdominal MRI on 11/16/2019 confirming an approximately 1.0 cm left-sided renal lesion which demonstrated imaging characteristics worrisome for a small renal cell carcinoma.  She was evaluated by the urologic service and was deemed a poor candidate for a laparoscopic partial nephrectomy.  She also referred to Dr. Brendia Sacks on 12/29/2019 for a second opinion regarding laparoscopic nephrectomy however ultimately elected to pursue active surveillance at that time.  Repeat abdominal MRI was performed on 04/24/2020 demonstrating slight enlargement of the worrisome left-sided renal lesion measuring approximately 1.2 cm and again active surveillance was elected.  Patient remains asymptomatic in regards to these left-sided renal lesions.  Specifically, no flank pain or hematuria.  No unintentional weight loss or weight gain.  No change in appetite or energy level.  Patient is scheduled to have a follow-up consultation and surveillance imaging with Dr. Blair Promise in August.  Of note, patient continues to struggle with paroxysmal atrial fibrillation (currently on  Eliquis), post failed attempt at cardioversion who is going to be admitted to the hospital in August for attempted inpatient intervention at the discretion of Dr. Curt Bears.  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   Hypercholesteremia    Hypertension    Left renal mass 12/29/2019   Mixed hyperlipidemia 02/27/2019   Obesity (BMI 35.0-39.9 without comorbidity) 12/29/2019   Obstructive sleep apnea on CPAP 06/19/2019   Paroxysmal atrial fibrillation ([place]) 02/27/2019   Pure hypercholesterolemia 01/01/2014   Thyroid disease    hypothyroid   Unspecified hypothyroidism 01/01/2014    Past Surgical History:  Procedure Laterality Date   BUBBLE STUDY  10/16/2020   Procedure: BUBBLE STUDY;  Surgeon: Elouise Munroe, MD;  Location: [place];  Service: Cardiovascular;;   CARDIOVERSION N/A 10/16/2020   Procedure: CARDIOVERSION;  Surgeon: Elouise Munroe, MD;  Location: [place];  Service: Cardiovascular;  Laterality: N/A;   COLPOSCOPY  10/98   normal, no biopsy   ENDOMETRIAL ABLATION  12/02/2002   ENDOMETRIAL BIOPSY  5/03   RIGHT/LEFT HEART CATH AND CORONARY ANGIOGRAPHY N/A 07/18/2020   Procedure: RIGHT/LEFT HEART CATH AND CORONARY ANGIOGRAPHY;  Surgeon: Jettie Booze, MD;  Location: [place] [facility];  Service: Cardiovascular;  Laterality: N/A;   TEE WITHOUT CARDIOVERSION  N/A 10/16/2020   Procedure: TRANSESOPHAGEAL ECHOCARDIOGRAM (TEE);  Surgeon: Elouise Munroe, MD;  Location: [facility] ENDOSCOPY;  Service:  Cardiovascular;  Laterality: N/A;   THYROIDECTOMY, PARTIAL     thyroid nodule excision left side    Allergies: Patient has no known allergies.  Medications: Prior to Admission medications   Medication Sig Start Date End Date Taking? Authorizing Provider  acetaminophen (TYLENOL) 650 MG CR tablet Take 1,300 mg by mouth every 8 (eight) hours as needed for pain.    [provider]  ALPRAZolam Duanne Moron) 0.5 MG tablet Take 0.25 mg by mouth daily as needed for anxiety. 12/14/13   [provider]  amLODipine (NORVASC) 5 MG tablet Take 2.5 mg by mouth at bedtime. 12/04/19   [provider]  apixaban (ELIQUIS) 5 MG TABS tablet Take 1 tablet (5 mg total) by mouth 2 (two) times daily. 07/18/20 08/28/21  Jettie Booze, MD  aspirin EC 81 MG tablet Take 1 tablet (81 mg total) by mouth daily. Swallow whole. Patient taking differently: Take 81 mg by mouth once a week. Swallow whole. 07/11/20   Tobb, Kardie, DO  atorvastatin (LIPITOR) 20 MG tablet Take 20 mg by mouth daily.    [provider]  hydrochlorothiazide (HYDRODIURIL) 12.5 MG tablet Take 12.5 mg by mouth daily. 06/23/20   [provider]  levothyroxine (SYNTHROID, LEVOTHROID) 125 MCG tablet Take 125 mcg by mouth daily before breakfast. 12/11/13   [provider]  loratadine (CLARITIN) 10 MG tablet Take 10 mg by mouth daily as needed for allergies.    [provider]  losartan (COZAAR) 100 MG tablet Take 100 mg by mouth daily. 07/07/20   [provider]  meloxicam (MOBIC) 7.5 MG tablet Take 7.5 mg by mouth every other day. 12/06/13   [provider]  metFORMIN (GLUCOPHAGE) 500 MG tablet Take 1 tablet (500 mg total) by mouth 2 (two) times daily with a meal. 07/20/20   Jettie Booze, MD  metoprolol succinate (TOPROL-XL) 100 MG 24 hr tablet Take 100 mg by mouth daily. 07/10/20   [provider]  nitroGLYCERIN (NITROSTAT) 0.4 MG SL tablet Place 1 tablet (0.4 mg total)  under the tongue every 5 (five) minutes as needed for chest pain. 07/11/20 10/20/20  Tobb, Kardie, DO  triazolam (HALCION) 0.25 MG tablet Take 0.25-0.5 mg by mouth at bedtime. 12/06/13   [provider]  TRULICITY 1.5 IH/4.7QQ SOPN Inject 1.5 mg into the skin every Monday. 06/02/19   [provider]     Family History  Problem Relation Age of Onset   Cancer Mother        lung,    Hypertension Father    Emphysema Father    Cancer Sister        tongue   Cancer Maternal Aunt        colon, cervical, throat   Heart attack Maternal Aunt    Diabetes Maternal Grandfather    Heart attack Maternal Grandmother     Social History   Socioeconomic History   Marital status: Divorced    Spouse name: Not on file   Number of children: 1   Years of education: Not on file   Highest education level: Not on file  Occupational History   Not on file  Tobacco Use   Smoking status: Former    Packs/day: 1.00    Years: 30.00    Pack years: 30.00    Types: Cigarettes    Quit date: 07/12/2003  Years since quitting: 17.3   Smokeless tobacco: Never  Vaping Use   Vaping Use: Never used  Substance and Sexual Activity   Alcohol use: Never   Drug use: No   Sexual activity: Yes    Partners: Male    Birth control/protection: Post-menopausal  Other Topics Concern   Not on file  Social History Narrative   Not on file   Social Determinants of Health   Financial Resource Strain: Not on file  Food Insecurity: Not on file  Transportation Needs: Not on file  Physical Activity: Not on file  Stress: Not on file  Social Connections: Not on file    ECOG Status: 1 - Symptomatic but completely ambulatory  Review of Systems  Review of Systems: A 12 point ROS discussed and pertinent positives are indicated in the HPI above.  All other systems are negative.  Physical Exam No direct physical exam was performed (except for noted visual exam findings with Video Visits).   Vital  Signs: LMP 12/02/2002 (Approximate)   Imaging:  Following examinations were reviewed in detail:  Abdominal MRI - 11/16/2019; 04/24/2020; CT abdomen and pelvis - 05/28/2020  11/16/2019: Left mid posterior - 1.0 x 0.9 x 0.6 cm  left superior anterior - 1.2 x 1.1 x 1.0 cm 04/24/2020: Left mid posterior - 1.2 x 1.1 cm   left superior anterior - 1.1 x 1.1 cm 05/28/2020: Left mid posterior - 1.1 x 1.0 cm   left superior anterior - 1.4 x 1.1 cm  No discrete right-sided renal lesions.  The left renal vein remains widely patent.  No regional adenopathy.  ECHO TEE  Result Date: 10/19/2020    TRANSESOPHOGEAL ECHO REPORT   Patient Name:   TAMARRA GEISELMAN Date of Exam: 10/16/2020 Medical Rec #:  [id number]         Height:       66.0 in Accession #:    [id number]        Weight:       238.6 lb Date of Birth:  07/06/1955         BSA:          2.156 m Patient Age:    34 years          BP:           103/60 mmHg Patient Gender: F                 HR:           66 bpm. Exam Location:  Inpatient Procedure: Transesophageal Echo, 3D Echo, Cardiac Doppler, Color Doppler and            Saline Contrast Bubble Study Indications:     R94.31 Abnormal EKG; Atrial fibrillation and Flutter  History:         Patient has prior history of Echocardiogram examinations, most                  recent 07/28/2020. CAD, Abnormal ECG, Arrythmias:Atrial                  Fibrillation and Atrial Flutter, Signs/Symptoms:Shortness of                  Breath and Dyspnea; Risk Factors:Diabetes, Hypertension,                  Dyslipidemia and Sleep Apnea. Breast cancer.  Sonographer:     Roseanna Rainbow RDCS Referring Phys:  [id number] [place] TOBB Diagnosing  Phys: Cherlynn Kaiser MD PROCEDURE: After discussion of the risks and benefits of a TEE, an informed consent was obtained from the patient. TEE procedure time was 29 minutes. The transesophogeal probe was passed without difficulty through the esophogus of the patient. Imaged were obtained with the patient in a  left lateral decubitus position. Sedation performed by different physician. The patient was monitored while under deep sedation. Anesthestetic sedation was provided intravenously by Anesthesiology: 738mg  of Propofol. The patient's vital signs; including heart rate, blood pressure, and oxygen saturation; remained stable throughout the procedure. The patient developed no complications during the procedure. A successful direct current cardioversion was performed at 120,  200 joules with 2 attempts. IMPRESSIONS  1. Left ventricular ejection fraction, by estimation, is 60 to 65%. The left ventricle has normal function.  2. Right ventricular systolic function is normal. The right ventricular size is normal.  3. Left atrial size was moderately dilated. No left atrial/left atrial appendage thrombus was detected. The LAA emptying velocity was 27 cm/s.  4. Right atrial size was mildly dilated.  5. The mitral valve is normal in structure. Mild mitral valve regurgitation.  6. The aortic valve is normal in structure. Aortic valve regurgitation is trivial.  7. Aortic dilatation noted. There is borderline dilatation of the aortic root and of the ascending aorta, measuring 38 mm. There is Moderate (Grade III) atheroma plaque involving the descending aorta.  8. Agitated saline contrast bubble study was positive with shunting observed after >6 cardiac cycles suggestive of intrapulmonary shunting. Bubbles noted in descending aorta 2-3 minutes after injection. Conclusion(s)/Recommendation(s): No LA/LAA thrombus identified. Successful cardioversion performed with restoration of normal sinus rhythm. FINDINGS  Left Ventricle: Left ventricular ejection fraction, by estimation, is 60 to 65%. The left ventricle has normal function. The left ventricular internal cavity size was normal in size. Right Ventricle: The right ventricular size is normal. No increase in right ventricular wall thickness. Right ventricular systolic function is normal.  Left Atrium: Left atrial size was moderately dilated. No left atrial/left atrial appendage thrombus was detected. The LAA emptying velocity was 27 cm/s. Right Atrium: Right atrial size was mildly dilated. Pericardium: There is no evidence of pericardial effusion. Mitral Valve: The mitral valve is normal in structure. Mild mitral valve regurgitation. Tricuspid Valve: The tricuspid valve is normal in structure. Tricuspid valve regurgitation is mild. Aortic Valve: The aortic valve is normal in structure. Aortic valve regurgitation is trivial. Pulmonic Valve: The pulmonic valve was normal in structure. Pulmonic valve regurgitation is trivial. Aorta: Aortic dilatation noted. There is borderline dilatation of the aortic root and of the ascending aorta, measuring 38 mm. There is moderate (Grade III) atheroma plaque involving the descending aorta. IAS/Shunts: No atrial level shunt detected by color flow Doppler. Agitated saline contrast was given intravenously to evaluate for intracardiac shunting. Agitated saline contrast bubble study was positive with shunting observed after >6 cardiac cycles suggestive of intrapulmonary shunting.   AORTA Ao Root diam: 3.80 cm Ao Asc diam:  3.80 cm Cherlynn Kaiser MD Electronically signed by Cherlynn Kaiser MD Signature Date/Time: 10/19/2020/1:07:13 PM    Final     Labs:  CBC: Recent Labs    04/02/20 0000 07/14/20 1351 07/18/20 1147 07/18/20 1156 10/14/20 1421  WBC 6.9 8.4  --   --  7.9  HGB 15.3 15.8 13.6 13.9 15.6  HCT 45 46.2 40.0 41.0 45.5  PLT 233 240  --   --  249    COAGS: No results for input(s): INR, APTT in  the last 8760 hours.  BMP: Recent Labs    07/14/20 1351 07/18/20 1147 07/18/20 1156 10/14/20 1421  NA 139 143 142 141  K 3.8 3.2* 3.3* 4.0  CL 99  --   --  99  CO2 21  --   --  22  GLUCOSE 171*  --   --  121*  BUN 14  --   --  17  CALCIUM 9.2  --   --  9.7  CREATININE 0.84  --   --  0.78    LIVER FUNCTION TESTS: No results for input(s):  BILITOT, AST, ALT, ALKPHOS, PROT, ALBUMIN in the last 8760 hours.  TUMOR MARKERS: No results for input(s): AFPTM, CEA, CA199, CHROMGRNA in the last 8760 hours.  Assessment and Plan:  Nicholle Falzon is a 65 y.o. female with past medical history significant for atrial fibrillation (on Eliquis), type 2 diabetes, hyperlipidemia, hypertension, obesity, obstructive sleep apnea, hypothyroidism, former smoker and hepatitis C (posttreatment) who is evaluated today via telemedicine consultation for left-sided renal lesions, incidentally discovered on CT scan performed 11/07/2019 for evaluation of umbilical pain, subsequently managed with active surveillance.  Patient remains asymptomatic in regards to these left-sided renal lesions.  Additionally, her creatinine remains normal (0.8 on 10/14/2020.  Following examinations were reviewed in detail:  Abdominal MRI - 11/16/2019; 04/24/2020; CT abdomen and pelvis - 05/28/2020  11/16/2019: Left mid posterior - 1.0 x 0.9 x 0.6 cm  left superior anterior - 1.2 x 1.1 x 1.0 cm 04/24/2020: Left mid posterior - 1.2 x 1.1 cm   left superior anterior - 1.1 x 1.1 cm 05/28/2020: Left mid posterior - 1.1 x 1.0 cm    left superior anterior - 1.4 x 1.1 cm  No discrete right-sided renal lesions.  The left renal vein remains widely patent.  No regional adenopathy.  I explained that as both renal lesions demonstrate imaging characteristics worrisome for a renal cell carcinoma, image guided biopsy is not required.  I also explained that incidentally discovered renal cell carcinoma typically has a indolent course as appears to be her case given only slight interval growth of both lesions.  Potential treatment options were discussed at great length with the patient including the following:  1.  Continued conservative management with active surveillance - I explained that incidentally discovered renal cell carcinoma tends to have a very indolent course and given the small size of  both lesions, this is a reasonable management option.  Per my discussion with the patient, given slight interval enlargement, albeit very small, the patient would prefer to undergo an intervention if possible   2.  Definitive surgical resection - I explained that this is considered the gold standard and would recommend pursuing this route if Dr. Brendia Sacks feels she is an operative candidate.  3.  Left-sided renal cryoablation - I explained that given the size (less than 3 cm) and number (less than 3) the patient is a candidate for attempted cryoablation.  I explained that the more worrisome lesion involving the posterior interpolar aspect of the left kidney is in a desirable location and while less than ideal, the lesion involving the superior anterior aspect of the left kidney is also amenable to attempted cryoablation.  I explained that between the two lesions, I am more worried about the lesion involving the posterior medial aspect of the left kidney given its apparent abutment adjacent to the left paraspinal musculature.   Risks and benefits of image guided renal cryoablation was discussed with  the patient including, but not limited to, failure to treat entire lesion, bleeding, infection, damage to adjacent structures, hematuria, urine leak, decrease in renal function or post procedural neuropathy.  I explained that given the small size of both lesions, biopsy would not be pursued as the intent of the procedure is definitive cryoablation.    I explained that while the cryoablation is performed with curative intent, the patient would require active surveillance with serial abdominal MRIs every 3 months for the first year, every 6 months for the following year and a follow-up scan 3 years following the procedure to ensure an imaging cure.  The patient demonstrated excellent understanding of the above conversation and all of her questions and concerns were addressed.  The patient is scheduled to have a  follow-up consultation and surveillance imaging with Dr. Brendia Sacks in August.  I again explained that as definitive nephrectomy and/or partial nephrectomy is considered the gold standard in treatment of renal cell carcinoma however if the patient is ultimately deemed a poor operative candidate renal cryoablation may be considered.  Of note, patient continues to struggle with paroxysmal atrial fibrillation (currently on Eliquis), post failed attempt at cardioversion, and is is going to be admitted to the hospital in August for attempted inpatient intervention at the discretion of Dr. Curt Bears.  Given above, we will wait until the patient has her follow-up appointment with Dr. Brendia Sacks in August before a definitive plan of care is determined.   PLAN: - The patient will maintain her follow-up appointment with Dr. Brendia Sacks scheduled for August following acquisition of renal protocol CT.   - The patient will contact the interventional radiology clinic regarding her final decision of either pursuing definitive laparoscopic nephrectomy/partial nephrectomy versus proceeding with image guided cryoablation. - If patient elects to pursue image guided cryoablation, we will obtain the most recent surveillance imaging and perform a repeat telemedicine consultation for definitive discussion regarding benefits and risk of renal cryoablation.  Thank you for this interesting consult.  I greatly enjoyed meeting Ilissa Rosner and look forward to participating in their care.  A copy of this report was sent to the requesting provider on this date.  Electronically Signed: Sandi Mariscal 11/05/2020, 8:21 AM   I spent a total of 40 Minutes in remote  clinical consultation, greater than 50% of which was counseling/coordinating care for left sided renal lesions.    Visit type: Audio only (telephone). Audio (no video) only due to patient's lack of internet/smartphone capability. Alternative for in-person consultation at [facility], [address], [place], [place]. This visit type was conducted due to national recommendations for restrictions regarding the COVID-19 Pandemic (e.g. social distancing).  This format is felt to be most appropriate for this patient at this time.  All issues noted in this document were discussed and addressed.

## 2020-11-28 ENCOUNTER — Other Ambulatory Visit (HOSPITAL_COMMUNITY)
Admission: RE | Admit: 2020-11-28 | Discharge: 2020-11-28 | Disposition: A | Discharge status: Home / Self Care | Payer: 59 | Source: Ambulatory Visit | Attending: Physician Assistant | Admitting: Physician Assistant

## 2020-11-28 DIAGNOSIS — Z20822 Contact with and (suspected) exposure to covid-19: Secondary | ICD-10-CM | POA: Insufficient documentation

## 2020-11-28 DIAGNOSIS — Z01812 Encounter for preprocedural laboratory examination: Secondary | ICD-10-CM | POA: Insufficient documentation

## 2020-11-28 LAB — SARS CORONAVIRUS 2 (TAT 6-24 HRS): SARS Coronavirus 2: NEGATIVE

## 2020-12-01 ENCOUNTER — Encounter (HOSPITAL_COMMUNITY): Payer: Self-pay | Admitting: Cardiology

## 2020-12-01 ENCOUNTER — Inpatient Hospital Stay (HOSPITAL_COMMUNITY)
Admission: RE | Admit: 2020-12-01 | Discharge: 2020-12-04 | DRG: 309 | Disposition: A | Discharge status: Home / Self Care | Payer: 59 | Source: Ambulatory Visit | Attending: Cardiology | Admitting: Cardiology

## 2020-12-01 ENCOUNTER — Encounter (HOSPITAL_COMMUNITY): Payer: Self-pay | Admitting: Physician Assistant

## 2020-12-01 ENCOUNTER — Ambulatory Visit (HOSPITAL_COMMUNITY)
Admission: RE | Admit: 2020-12-01 | Discharge: 2020-12-01 | Disposition: A | Discharge status: Home / Self Care | Payer: 59 | Source: Ambulatory Visit | Attending: Physician Assistant | Admitting: Physician Assistant

## 2020-12-01 ENCOUNTER — Other Ambulatory Visit: Payer: Self-pay

## 2020-12-01 VITALS — BP 128/90 | HR 70 | Ht 66.0 in | Wt 236.0 lb

## 2020-12-01 DIAGNOSIS — Z6838 Body mass index (BMI) 38.0-38.9, adult: Secondary | ICD-10-CM

## 2020-12-01 DIAGNOSIS — Z7982 Long term (current) use of aspirin: Secondary | ICD-10-CM | POA: Diagnosis not present

## 2020-12-01 DIAGNOSIS — E119 Type 2 diabetes mellitus without complications: Secondary | ICD-10-CM | POA: Diagnosis present

## 2020-12-01 DIAGNOSIS — D6869 Other thrombophilia: Secondary | ICD-10-CM

## 2020-12-01 DIAGNOSIS — Z8249 Family history of ischemic heart disease and other diseases of the circulatory system: Secondary | ICD-10-CM | POA: Diagnosis not present

## 2020-12-01 DIAGNOSIS — Z79890 Hormone replacement therapy (postmenopausal): Secondary | ICD-10-CM

## 2020-12-01 DIAGNOSIS — I4819 Other persistent atrial fibrillation: Secondary | ICD-10-CM | POA: Diagnosis present

## 2020-12-01 DIAGNOSIS — Z833 Family history of diabetes mellitus: Secondary | ICD-10-CM | POA: Diagnosis not present

## 2020-12-01 DIAGNOSIS — Z79899 Other long term (current) drug therapy: Secondary | ICD-10-CM | POA: Diagnosis not present

## 2020-12-01 DIAGNOSIS — Z20822 Contact with and (suspected) exposure to covid-19: Secondary | ICD-10-CM | POA: Diagnosis present

## 2020-12-01 DIAGNOSIS — Z7901 Long term (current) use of anticoagulants: Secondary | ICD-10-CM | POA: Diagnosis not present

## 2020-12-01 DIAGNOSIS — Z87891 Personal history of nicotine dependence: Secondary | ICD-10-CM | POA: Diagnosis not present

## 2020-12-01 DIAGNOSIS — E876 Hypokalemia: Secondary | ICD-10-CM | POA: Diagnosis present

## 2020-12-01 DIAGNOSIS — Z791 Long term (current) use of non-steroidal anti-inflammatories (NSAID): Secondary | ICD-10-CM | POA: Diagnosis not present

## 2020-12-01 DIAGNOSIS — E039 Hypothyroidism, unspecified: Secondary | ICD-10-CM | POA: Diagnosis present

## 2020-12-01 DIAGNOSIS — E669 Obesity, unspecified: Secondary | ICD-10-CM | POA: Diagnosis present

## 2020-12-01 DIAGNOSIS — I10 Essential (primary) hypertension: Secondary | ICD-10-CM | POA: Diagnosis present

## 2020-12-01 DIAGNOSIS — I2510 Atherosclerotic heart disease of native coronary artery without angina pectoris: Secondary | ICD-10-CM | POA: Diagnosis present

## 2020-12-01 DIAGNOSIS — G4733 Obstructive sleep apnea (adult) (pediatric): Secondary | ICD-10-CM | POA: Diagnosis present

## 2020-12-01 DIAGNOSIS — Z7984 Long term (current) use of oral hypoglycemic drugs: Secondary | ICD-10-CM

## 2020-12-01 DIAGNOSIS — Z9989 Dependence on other enabling machines and devices: Secondary | ICD-10-CM

## 2020-12-01 DIAGNOSIS — E782 Mixed hyperlipidemia: Secondary | ICD-10-CM | POA: Diagnosis present

## 2020-12-01 LAB — BASIC METABOLIC PANEL
Anion gap: 12 (ref 5–15)
Anion gap: 10 (ref 5–15)
BUN: 15 mg/dL (ref 8–23)
BUN: 15 mg/dL (ref 8–23)
CO2: 23 mmol/L (ref 22–32)
CO2: 26 mmol/L (ref 22–32)
Calcium: 8.8 mg/dL — ABNORMAL LOW (ref 8.9–10.3)
Calcium: 8.8 mg/dL — ABNORMAL LOW (ref 8.9–10.3)
Chloride: 104 mmol/L (ref 98–111)
Chloride: 103 mmol/L (ref 98–111)
Creatinine, Ser: 0.9 mg/dL (ref 0.44–1.00)
Creatinine, Ser: 0.82 mg/dL (ref 0.44–1.00)
GFR, Estimated: >60 mL/min (ref >60)
GFR, Estimated: >60 mL/min (ref >60)
Glucose, Bld: 181 mg/dL — ABNORMAL HIGH (ref 70–99)
Glucose, Bld: 97 mg/dL (ref 70–99)
Potassium: 3.2 mmol/L — ABNORMAL LOW (ref 3.5–5.1)
Potassium: 3.2 mmol/L — ABNORMAL LOW (ref 3.5–5.1)
Sodium: 139 mmol/L (ref 135–145)
Sodium: 139 mmol/L (ref 135–145)

## 2020-12-01 LAB — HIV ANTIBODY (ROUTINE TESTING W REFLEX): HIV Screen 4th Generation wRfx: NONREACTIVE

## 2020-12-01 LAB — MAGNESIUM
Magnesium: 1.6 mg/dL — ABNORMAL LOW (ref 1.7–2.4)
Magnesium: 2.7 mg/dL — ABNORMAL HIGH (ref 1.7–2.4)

## 2020-12-01 MED ORDER — SODIUM CHLORIDE 0.9 % IV SOLN: 250.0000 mL | INTRAVENOUS | Status: DC | PRN

## 2020-12-01 MED ORDER — POTASSIUM CHLORIDE CRYS ER 20 MEQ PO TBCR
40.0000 meq | EXTENDED_RELEASE_TABLET | Freq: Once | ORAL | Status: AC
  Administered 2020-12-01: 40 meq via ORAL
  Filled 2020-12-01: qty 2

## 2020-12-01 MED ORDER — DOFETILIDE 500 MCG PO CAPS
500.0000 ug | ORAL_CAPSULE | Freq: Two times a day (BID) | ORAL | Status: DC
  Administered 2020-12-02: 500 ug via ORAL
  Filled 2020-12-01 (×2): qty 1

## 2020-12-01 MED ORDER — SODIUM CHLORIDE 0.9% FLUSH: 3.0000 mL | INTRAVENOUS | Status: DC | PRN

## 2020-12-01 MED ORDER — LEVOTHYROXINE SODIUM 25 MCG PO TABS
125.0000 ug | ORAL_TABLET | Freq: Every day | ORAL | Status: DC
  Administered 2020-12-02 – 2020-12-04 (×3): 125 ug via ORAL
  Filled 2020-12-01 (×3): qty 1

## 2020-12-01 MED ORDER — MAGNESIUM SULFATE 4 GM/100ML IV SOLN
4.0000 g | Freq: Once | INTRAVENOUS | Status: AC
  Administered 2020-12-01: 4 g via INTRAVENOUS
  Filled 2020-12-01: qty 100

## 2020-12-01 MED ORDER — METOPROLOL SUCCINATE ER 100 MG PO TB24
100.0000 mg | ORAL_TABLET | Freq: Every day | ORAL | Status: DC
  Administered 2020-12-02 – 2020-12-03 (×2): 100 mg via ORAL
  Filled 2020-12-01 (×3): qty 1

## 2020-12-01 MED ORDER — METFORMIN HCL 500 MG PO TABS
500.0000 mg | ORAL_TABLET | Freq: Three times a day (TID) | ORAL | Status: DC
  Administered 2020-12-01 – 2020-12-02 (×2): 500 mg via ORAL
  Filled 2020-12-01 (×2): qty 1

## 2020-12-01 MED ORDER — LOSARTAN POTASSIUM 50 MG PO TABS
100.0000 mg | ORAL_TABLET | Freq: Every day | ORAL | Status: DC
  Administered 2020-12-02 – 2020-12-04 (×3): 100 mg via ORAL
  Filled 2020-12-01 (×4): qty 2

## 2020-12-01 MED ORDER — ACETAMINOPHEN ER 650 MG PO TBCR: 1300.0000 mg | EXTENDED_RELEASE_TABLET | Freq: Three times a day (TID) | ORAL | Status: DC | PRN

## 2020-12-01 MED ORDER — ATORVASTATIN CALCIUM 10 MG PO TABS
20.0000 mg | ORAL_TABLET | Freq: Every day | ORAL | Status: DC
  Administered 2020-12-01 – 2020-12-03 (×3): 20 mg via ORAL
  Filled 2020-12-01 (×3): qty 2

## 2020-12-01 MED ORDER — POTASSIUM CHLORIDE CRYS ER 20 MEQ PO TBCR
40.0000 meq | EXTENDED_RELEASE_TABLET | ORAL | Status: AC
  Administered 2020-12-01 (×2): 40 meq via ORAL
  Filled 2020-12-01 (×2): qty 2

## 2020-12-01 MED ORDER — NITROGLYCERIN 0.4 MG SL SUBL: 0.4000 mg | SUBLINGUAL_TABLET | SUBLINGUAL | Status: DC | PRN

## 2020-12-01 MED ORDER — ACETAMINOPHEN 325 MG PO TABS
650.0000 mg | ORAL_TABLET | Freq: Four times a day (QID) | ORAL | Status: DC | PRN
  Administered 2020-12-02 (×3): 650 mg via ORAL
  Filled 2020-12-01 (×3): qty 2

## 2020-12-01 MED ORDER — APIXABAN 5 MG PO TABS
5.0000 mg | ORAL_TABLET | Freq: Two times a day (BID) | ORAL | Status: DC
  Administered 2020-12-01 – 2020-12-04 (×6): 5 mg via ORAL
  Filled 2020-12-01 (×6): qty 1

## 2020-12-01 MED ORDER — AMLODIPINE BESYLATE 2.5 MG PO TABS
2.5000 mg | ORAL_TABLET | Freq: Every day | ORAL | Status: DC
  Administered 2020-12-01 – 2020-12-03 (×3): 2.5 mg via ORAL
  Filled 2020-12-01 (×3): qty 1

## 2020-12-01 MED ORDER — LORATADINE 10 MG PO TABS: 10.0000 mg | ORAL_TABLET | Freq: Every day | ORAL | Status: DC | PRN

## 2020-12-01 MED ORDER — GABAPENTIN 100 MG PO CAPS
100.0000 mg | ORAL_CAPSULE | Freq: Every day | ORAL | Status: DC
  Administered 2020-12-02 – 2020-12-04 (×3): 100 mg via ORAL
  Filled 2020-12-01 (×4): qty 1

## 2020-12-01 MED ORDER — ALPRAZOLAM 0.25 MG PO TABS: 0.2500 mg | ORAL_TABLET | Freq: Every day | ORAL | Status: DC | PRN

## 2020-12-01 MED ORDER — SODIUM CHLORIDE 0.9% FLUSH
3.0000 mL | Freq: Two times a day (BID) | INTRAVENOUS | Status: DC
  Administered 2020-12-01 – 2020-12-04 (×6): 3 mL via INTRAVENOUS

## 2020-12-01 MED ORDER — TRIAZOLAM 0.125 MG PO TABS
0.2500 mg | ORAL_TABLET | Freq: Every day | ORAL | Status: DC
  Administered 2020-12-01 – 2020-12-02 (×2): 0.25 mg via ORAL
  Administered 2020-12-03: 0.5 mg via ORAL
  Filled 2020-12-01 (×4): qty 2

## 2020-12-01 NOTE — H&P (Signed)
Primary Care Physician: Cher Nakai, MD Primary Cardiologist: Dr Harriet Masson Primary Electrophysiologist: Dr Curt Bears  Referring Physician: Dr Phineas Inches is a 65 y.o. female with a history of DM, HLD, OSA, CAD, atrial fibrillation who presents for follow up in the [facility].  The patient was initially diagnosed with atrial fibrillation in 2019. Patient is on Eliquis for a CHADS2VASC score of 4. She underwent TEE guided DCCV on 10/16/20 but had early return of her afib. She was seen by Dr Curt Bears on 10/20/20 and dofetilide admission was recommended.   On follow up today, patient presents for dofetilide admission. She remains in rate controlled afib with symptoms of weakness and fatigue. She denies any missed doses of anticoagulation in the last 3 weeks. HCTZ has been discontinued.    Today, she denies symptoms of palpitations, chest pain, shortness of breath, orthopnea, PND, lower extremity edema, dizziness, presyncope, syncope, snoring, daytime somnolence, bleeding, or neurologic sequela. The patient is tolerating medications without difficulties and is otherwise without complaint today.     Atrial Fibrillation Risk Factors:   she does have symptoms or diagnosis of sleep apnea. she is compliant with CPAP therapy. she does not have a history of rheumatic fever.     she has a BMI of Body mass index is 38.09 kg/m.Marland Kitchen    Filed Weights    12/01/20 1144  Weight: 107 kg           Family History  Problem Relation Age of Onset   Cancer Mother          lung,   Hypertension Father     Emphysema Father     Cancer Sister          tongue   Cancer Maternal Aunt          colon, cervical, throat   Heart attack Maternal Aunt     Diabetes Maternal Grandfather     Heart attack Maternal Grandmother          Atrial Fibrillation Management history:   Previous antiarrhythmic drugs: none Previous cardioversions: 10/16/20 Previous ablations:  none CHADS2VASC score: 4 Anticoagulation history: Eliquis         Past Medical History:  Diagnosis Date   Abnormal weight gain 07/10/2020   Atrial fibrillation ([place])     Change in bowel habit 07/10/2020   DES exposure in utero     DM2 (diabetes mellitus, type 2) ([place])     Essential hypertension 01/01/2014   Family history of breast cancer in first degree relative 01/01/2014   Family history of colonic polyps 07/10/2020   Fibroids     Hepatitis C 11/99    2 yr tx, "remission", told Ag neg   History of diabetes mellitus     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   Hypercholesteremia     Hypertension     Left renal mass 12/29/2019   Mixed hyperlipidemia 02/27/2019   Obesity (BMI 35.0-39.9 without comorbidity) 12/29/2019   Obstructive sleep apnea on CPAP 06/19/2019   Paroxysmal atrial fibrillation ([place]) 02/27/2019   Pure hypercholesterolemia 01/01/2014   Thyroid disease      hypothyroid   Unspecified hypothyroidism 01/01/2014         Past Surgical History:  Procedure Laterality Date   BUBBLE STUDY   10/16/2020    Procedure: BUBBLE STUDY;  Surgeon: Elouise Munroe, MD;  Location: [facility];  Service: Cardiovascular;;  CARDIOVERSION N/A 10/16/2020    Procedure: CARDIOVERSION;  Surgeon: Elouise Munroe, MD;  Location: [facility] ENDOSCOPY;  Service: Cardiovascular;  Laterality: N/A;   COLPOSCOPY   10/98    normal, no biopsy   ENDOMETRIAL ABLATION   12/02/2002   ENDOMETRIAL BIOPSY   5/03   IR RADIOLOGIST EVAL & MGMT   11/05/2020   RIGHT/LEFT HEART CATH AND CORONARY ANGIOGRAPHY N/A 07/18/2020    Procedure: RIGHT/LEFT HEART CATH AND CORONARY ANGIOGRAPHY;  Surgeon: Jettie Booze, MD;  Location: [facility];  Service: Cardiovascular;  Laterality: N/A;   TEE WITHOUT CARDIOVERSION N/A 10/16/2020    Procedure: TRANSESOPHAGEAL ECHOCARDIOGRAM (TEE);  Surgeon: Elouise Munroe, MD;  Location: [facility] ENDOSCOPY;  Service: Cardiovascular;  Laterality: N/A;   THYROIDECTOMY,  PARTIAL        thyroid nodule excision left side            Current Outpatient Medications  Medication Sig Dispense Refill   acetaminophen (TYLENOL) 650 MG CR tablet Take 1,300 mg by mouth every 8 (eight) hours as needed for pain.       ALPRAZolam (XANAX) 0.5 MG tablet Take 0.25 mg by mouth daily as needed for anxiety.       amLODipine (NORVASC) 5 MG tablet Take 2.5 mg by mouth at bedtime.       apixaban (ELIQUIS) 5 MG TABS tablet Take 1 tablet (5 mg total) by mouth 2 (two) times daily. 60 tablet 13   atorvastatin (LIPITOR) 20 MG tablet Take 20 mg by mouth daily.       gabapentin (NEURONTIN) 100 MG capsule Take 100 mg by mouth daily.       levothyroxine (SYNTHROID, LEVOTHROID) 125 MCG tablet Take 125 mcg by mouth daily before breakfast.       loratadine (CLARITIN) 10 MG tablet Take 10 mg by mouth daily as needed for allergies.       losartan (COZAAR) 100 MG tablet Take 100 mg by mouth daily.       meloxicam (MOBIC) 7.5 MG tablet Take 7.5 mg by mouth every other day.       metFORMIN (GLUCOPHAGE) 500 MG tablet Take 1 tablet (500 mg total) by mouth 2 (two) times daily with a meal. (Patient taking differently: Take 500 mg by mouth 3 (three) times daily with meals. '1000mg'$  AM and 500PM)       metoprolol succinate (TOPROL-XL) 100 MG 24 hr tablet Take 100 mg by mouth daily.       nitroGLYCERIN (NITROSTAT) 0.4 MG SL tablet Place 1 tablet (0.4 mg total) under the tongue every 5 (five) minutes as needed for chest pain. 90 tablet 3   triazolam (HALCION) 0.25 MG tablet Take 0.25-0.5 mg by mouth at bedtime.       TRULICITY 1.5 0000000 SOPN Inject 1.5 mg into the skin every Monday.       aspirin EC 81 MG tablet Take 1 tablet (81 mg total) by mouth daily. Swallow whole. (Patient not taking: Reported on 12/01/2020) 90 tablet 3    No current facility-administered medications for this encounter.      No Known Allergies   Social History         Socioeconomic History   Marital status: Divorced       Spouse name: Not on file   Number of children: 1   Years of education: Not on file   Highest education level: Not on file  Occupational History   Not on file  Tobacco Use  Smoking status: Former      Packs/day: 1.00      Years: 30.00      Pack years: 30.00      Types: Cigarettes      Quit date: 07/12/2003      Years since quitting: 17.4   Smokeless tobacco: Never  Vaping Use   Vaping Use: Never used  Substance and Sexual Activity   Alcohol use: Never   Drug use: No   Sexual activity: Yes      Partners: Male      Birth control/protection: Post-menopausal  Other Topics Concern   Not on file  Social History Narrative   Not on file    Social Determinants of Health    Financial Resource Strain: Not on file  Food Insecurity: Not on file  Transportation Needs: Not on file  Physical Activity: Not on file  Stress: Not on file  Social Connections: Not on file  Intimate Partner Violence: Not on file        ROS- All systems are reviewed and negative except as per the HPI above.   Physical Exam:    Vitals:    12/01/20 1144  BP: 128/90  Pulse: 70  Weight: 107 kg  Height: '5\' 6"'$  (1.676 m)      GEN- The patient is a well appearing obese female, alert and oriented x 3 today.   Head- normocephalic, atraumatic Eyes-  Sclera clear, conjunctiva pink Ears- hearing intact Oropharynx- clear Neck- supple Lungs- Clear to ausculation bilaterally, normal work of breathing Heart- irregular rate and rhythm, no murmurs, rubs or gallops GI- soft, NT, ND, + BS Extremities- no clubbing, cyanosis, or edema MS- no significant deformity or atrophy Skin- no rash or lesion Psych- euthymic mood, full affect Neuro- strength and sensation are intact      Wt Readings from Last 3 Encounters:  12/01/20 107 kg  10/27/20 109.1 kg  10/20/20 108 kg      EKG today demonstrates  Afib Vent. rate 70 BPM PR interval * ms QRS duration 82 ms QT/QTcB 424/457 ms   Echo 07/28/20 demonstrated  1.  Left ventricular ejection fraction, by estimation, is 60 to 65%. The  left ventricle has normal function. The left ventricle has no regional  wall motion abnormalities. Left ventricular diastolic parameters are  indeterminate.   2. Right ventricular systolic function is normal. The right ventricular  size is normal. There is normal pulmonary artery systolic pressure.   3. Left atrial size was mild to moderately dilated.   4. The mitral valve is normal in structure. No evidence of mitral valve  regurgitation. No evidence of mitral stenosis.   5. The aortic valve is normal in structure. Aortic valve regurgitation is  not visualized. No aortic stenosis is present.   6. There is mild dilatation of the ascending aorta, measuring 38 mm.   7. The inferior vena cava is normal in size with greater than 50%  respiratory variability, suggesting right atrial pressure of 3 mmHg.   Epic records are reviewed at length today   CHA2DS2-VASc Score = 4  The patient's score is based upon: CHF History: No HTN History: No Diabetes History: Yes Stroke History: No Vascular Disease History: Yes Age Score: 1 Gender Score: 1       ASSESSMENT AND PLAN: 1. Persistent Atrial Fibrillation (ICD10:  I48.19) The patient's CHA2DS2-VASc score is 4, indicating a 4.8% annual risk of stroke.   Patient presents for dofetilide admission.  Patient aware of price  of dofetilide. Continue Eliquis 5 mg BID, states no missed doses in the last 3 weeks. No recent benadryl use PharmD has screened medications. HCTZ has been discontinued. QTc in SR 478 ms, OK per Dr Curt Bears  Labs today show creatinine at 0.9, K+ 3.2 and mag 1.6, CrCl calculated at 105 mL/min. Her electrolytes will need to be repleted  prior to initiation of dofetilide. Continue Toprol 100 mg daily   2. Secondary Hypercoagulable State (ICD10:  D68.69) The patient is at significant risk for stroke/thromboembolism based upon her CHA2DS2-VASc Score of 4.  Continue  Apixaban (Eliquis).    3. Obesity Body mass index is 38.09 kg/m. Lifestyle modification was discussed at length including regular exercise and weight reduction.   4. CAD Nonobstructive, no anginal symptoms.   5. HTN Stable, no changes today.   6. OSA The importance of adequate treatment of sleep apnea was discussed today in order to improve our ability to maintain sinus rhythm long term. Encouraged compliance with CPAP therapy.     To be admitted later today once a bed becomes available.      [facility] [address] [place], [place] [address] [phone] 12/01/2020 11:55 AM     -----------------------------------------------------------------  I have seen, examined the patient, and reviewed the above assessment and plan.    Patient presenting for dofetilide loading.  QTc 43m. Cr 0.9, K3.2, Mg 1.6.  #Persistent AF Plan to start dofetilide 5066m this evening. Cont eliquis  #OSA CPAP compliance encouraged  #HTN Controlled today    CAVickie EpleyMD 12/01/2020 2:50 PM

## 2020-12-01 NOTE — Care Management (Signed)
1408 12-01-20 Patient presented for Tikosyn Load. Benefits check submitted for cost and Case Manager will follow for pharmacy of choice.

## 2020-12-01 NOTE — Progress Notes (Signed)
Pharmacy: Dofetilide (Tikosyn) - Follow Up Assessment and Electrolyte Replacement  Pharmacy consulted to assist in monitoring and replacing electrolytes in this 65 y.o. female admitted on 12/01/2020 undergoing dofetilide initiation. First dofetilide dose: scheduled for 8/1 PM but will be delayed due to electrolyte repletion.  Labs:    Component Value Date/Time   K 3.2 (L) 12/01/2020 1848   MG 2.7 (H) 12/01/2020 1848     Plan: Potassium: K < 3.5:  Give KCl 40 mEq q4hr x2   Magnesium: Mg > 2: No additional supplementation needed  Will delay initiation this evening and plan to start on 8/2 AM pending K/Mg repeat levels - RN aware of plan.  Thank you for allowing pharmacy to be a part of this patient's care.  Alycia Rossetti, PharmD, BCPS Clinical Pharmacist Clinical phone for 12/01/2020: [phone] 12/01/2020 8:12 PM   **Pharmacist phone directory can now be found on [email or web address] ([email or web address]).  Listed under [place].

## 2020-12-01 NOTE — TOC Benefit Eligibility Note (Addendum)
Transition of Care [facility]) Benefit Eligibility Note    Patient Details  Name: Melissa Boyer MRN: [id number] Date of Birth: 10-02-1955  Per :Mindy R. W/[facility] [phone]  Dofetlide generic for Tikosyn 53mg,250mcg,125mcg BID.   Covered?: Yes  Tier 2  Prescription Coverage Preferred Pharmacy: [facility]    Co-pay $40.00  Prior Approval: No No Deductible       HShelda AltesPhone Number: 12/01/2020, 3:19 PM

## 2020-12-01 NOTE — Progress Notes (Signed)
Pharmacy: Dofetilide (Tikosyn) - Initial Consult Assessment and Electrolyte Replacement  Pharmacy consulted to assist in monitoring and replacing electrolytes in this 65 y.o. female admitted on 12/01/2020 undergoing dofetilide initiation. First dofetilide dose: ordered 500 mcg for 8/1 PM  Assessment:  Patient Exclusion Criteria: If any screening criteria checked as "Yes", then  patient  should NOT receive dofetilide until criteria item is corrected.  If "Yes" please indicate correction plan.  YES  NO Patient  Exclusion Criteria Correction Plan   '[x]'$   '[]'$   Baseline QTc interval is greater than or equal to 440 msec. IF above YES box checked dofetilide contraindicated unless patient has ICD; then may proceed if QTc 500-550 msec or with known ventricular conduction abnormalities may proceed with QTc 550-600 msec. QTc =   478 ms - OK per Dr. Curt Bears   '[]'$   '[x]'$   Patient is known or suspected to have a digoxin level greater than 2 ng/ml: No results found for: DIGOXIN     '[]'$   '[x]'$   Creatinine clearance less than 20 ml/min (calculated using Cockcroft-Gault, actual body weight and serum creatinine): Estimated Creatinine Clearance: 76.9 mL/min (by C-G formula based on SCr of 0.9 mg/dL).     '[]'$   '[x]'$  Patient has received drugs known to prolong the QT intervals within the last 48 hours (phenothiazines, tricyclics or tetracyclic antidepressants, erythromycin, H-1 antihistamines, cisapride, fluoroquinolones, azithromycin). Drugs not listed above may have an, as yet, undetected potential to prolong the QT interval, updated information on QT prolonging agents is available at this website:QT prolonging agents or www.crediblemeds.org    '[]'$   '[x]'$   Patient received a dose of hydrochlorothiazide (Oretic) alone or in any combination including triamterene (Dyazide, Maxzide) in the last 48 hours.    '[]'$   '[x]'$  Patient received a medication known to increase dofetilide plasma concentrations prior to initial  dofetilide dose:  Trimethoprim (Primsol, Proloprim) in the last 36 hours Verapamil (Calan, Verelan) in the last 36 hours or a sustained release dose in the last 72 hours Megestrol (Megace) in the last 5 days  Cimetidine (Tagamet) in the last 6 hours Ketoconazole (Nizoral) in the last 24 hours Itraconazole (Sporanox) in the last 48 hours  Prochlorperazine (Compazine) in the last 36 hours     '[]'$   '[x]'$   Patient is known to have a history of torsades de pointes; congenital or acquired long QT syndromes.    '[]'$   '[x]'$   Patient has received a Class 1 antiarrhythmic with less than 2 half-lives since last dose. (Disopyramide, Quinidine, Procainamide, Lidocaine, Mexiletine, Flecainide, Propafenone)    '[]'$   '[x]'$   Patient has received amiodarone therapy in the past 3 months or amiodarone level is greater than 0.3 ng/ml.    Patient has been appropriately anticoagulated with Eliquis  Labs:    Component Value Date/Time   K 3.2 (L) 12/01/2020 1152   MG 1.6 (L) 12/01/2020 1152     Plan: Potassium: K < 3.5:  Defer Tikosyn initiation until lytes repleted -  KCl 40 mEq q4hr x2 then BMET at 7 PM.   Magnesium: Mg 1.3-1.7: Hold Tikosyn initiation and give Mg 4 gm x1 IV, recheck Mg 4hr after end of infusion - repeat appropriate dose if not > 1.8     Thank you for allowing pharmacy to participate in this patient's care   [place] Crane, Roylene Reason, [facility] Clinical Pharmacist  12/01/2020 1:57 PM   [facility] pharmacy phone numbers are listed on [email or web address]

## 2020-12-01 NOTE — Progress Notes (Signed)
Primary Care Physician: Cher Nakai, MD Primary Cardiologist: Dr Harriet Masson Primary Electrophysiologist: Dr Curt Bears  Referring Physician: Dr Phineas Inches is a 65 y.o. female with a history of DM, HLD, OSA, CAD, atrial fibrillation who presents for follow up in the [facility].  The patient was initially diagnosed with atrial fibrillation in 2019. Patient is on Eliquis for a CHADS2VASC score of 4. She underwent TEE guided DCCV on 10/16/20 but had early return of her afib. She was seen by Dr Curt Bears on 10/20/20 and dofetilide admission was recommended.   On follow up today, patient presents for dofetilide admission. She remains in rate controlled afib with symptoms of weakness and fatigue. She denies any missed doses of anticoagulation in the last 3 weeks. HCTZ has been discontinued.   Today, she denies symptoms of palpitations, chest pain, shortness of breath, orthopnea, PND, lower extremity edema, dizziness, presyncope, syncope, snoring, daytime somnolence, bleeding, or neurologic sequela. The patient is tolerating medications without difficulties and is otherwise without complaint today.    Atrial Fibrillation Risk Factors:  she does have symptoms or diagnosis of sleep apnea. she is compliant with CPAP therapy. she does not have a history of rheumatic fever.   she has a BMI of Body mass index is 38.09 kg/m.Marland Kitchen Filed Weights   12/01/20 1144  Weight: 107 kg    Family History  Problem Relation Age of Onset   Cancer Mother        lung,    Hypertension Father    Emphysema Father    Cancer Sister        tongue   Cancer Maternal Aunt        colon, cervical, throat   Heart attack Maternal Aunt    Diabetes Maternal Grandfather    Heart attack Maternal Grandmother      Atrial Fibrillation Management history:  Previous antiarrhythmic drugs: none Previous cardioversions: 10/16/20 Previous ablations: none CHADS2VASC score: 4 Anticoagulation  history: Eliquis   Past Medical History:  Diagnosis Date   Abnormal weight gain 07/10/2020   Atrial fibrillation ([place])    Change in bowel habit 07/10/2020   DES exposure in utero    DM2 (diabetes mellitus, type 2) ([place])    Essential hypertension 01/01/2014   Family history of breast cancer in first degree relative 01/01/2014   Family history of colonic polyps 07/10/2020   Fibroids    Hepatitis C 11/99   2 yr tx, "remission", told Ag neg   History of diabetes mellitus    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   Hypercholesteremia    Hypertension    Left renal mass 12/29/2019   Mixed hyperlipidemia 02/27/2019   Obesity (BMI 35.0-39.9 without comorbidity) 12/29/2019   Obstructive sleep apnea on CPAP 06/19/2019   Paroxysmal atrial fibrillation ([place]) 02/27/2019   Pure hypercholesterolemia 01/01/2014   Thyroid disease    hypothyroid   Unspecified hypothyroidism 01/01/2014   Past Surgical History:  Procedure Laterality Date   BUBBLE STUDY  10/16/2020   Procedure: BUBBLE STUDY;  Surgeon: Elouise Munroe, MD;  Location: [facility];  Service: Cardiovascular;;   CARDIOVERSION N/A 10/16/2020   Procedure: CARDIOVERSION;  Surgeon: Elouise Munroe, MD;  Location: [facility];  Service: Cardiovascular;  Laterality: N/A;   COLPOSCOPY  10/98   normal, no biopsy   ENDOMETRIAL ABLATION  12/02/2002   ENDOMETRIAL BIOPSY  5/03   IR RADIOLOGIST EVAL & MGMT  11/05/2020   RIGHT/LEFT HEART  CATH AND CORONARY ANGIOGRAPHY N/A 07/18/2020   Procedure: RIGHT/LEFT HEART CATH AND CORONARY ANGIOGRAPHY;  Surgeon: Jettie Booze, MD;  Location: [facility];  Service: Cardiovascular;  Laterality: N/A;   TEE WITHOUT CARDIOVERSION N/A 10/16/2020   Procedure: TRANSESOPHAGEAL ECHOCARDIOGRAM (TEE);  Surgeon: Elouise Munroe, MD;  Location: [facility] ENDOSCOPY;  Service: Cardiovascular;  Laterality: N/A;   THYROIDECTOMY, PARTIAL     thyroid nodule excision left side    Current Outpatient  Medications  Medication Sig Dispense Refill   acetaminophen (TYLENOL) 650 MG CR tablet Take 1,300 mg by mouth every 8 (eight) hours as needed for pain.     ALPRAZolam (XANAX) 0.5 MG tablet Take 0.25 mg by mouth daily as needed for anxiety.     amLODipine (NORVASC) 5 MG tablet Take 2.5 mg by mouth at bedtime.     apixaban (ELIQUIS) 5 MG TABS tablet Take 1 tablet (5 mg total) by mouth 2 (two) times daily. 60 tablet 13   atorvastatin (LIPITOR) 20 MG tablet Take 20 mg by mouth daily.     gabapentin (NEURONTIN) 100 MG capsule Take 100 mg by mouth daily.     levothyroxine (SYNTHROID, LEVOTHROID) 125 MCG tablet Take 125 mcg by mouth daily before breakfast.     loratadine (CLARITIN) 10 MG tablet Take 10 mg by mouth daily as needed for allergies.     losartan (COZAAR) 100 MG tablet Take 100 mg by mouth daily.     meloxicam (MOBIC) 7.5 MG tablet Take 7.5 mg by mouth every other day.     metFORMIN (GLUCOPHAGE) 500 MG tablet Take 1 tablet (500 mg total) by mouth 2 (two) times daily with a meal. (Patient taking differently: Take 500 mg by mouth 3 (three) times daily with meals. '1000mg'$  AM and 500PM)     metoprolol succinate (TOPROL-XL) 100 MG 24 hr tablet Take 100 mg by mouth daily.     nitroGLYCERIN (NITROSTAT) 0.4 MG SL tablet Place 1 tablet (0.4 mg total) under the tongue every 5 (five) minutes as needed for chest pain. 90 tablet 3   triazolam (HALCION) 0.25 MG tablet Take 0.25-0.5 mg by mouth at bedtime.     TRULICITY 1.5 0000000 SOPN Inject 1.5 mg into the skin every Monday.     aspirin EC 81 MG tablet Take 1 tablet (81 mg total) by mouth daily. Swallow whole. (Patient not taking: Reported on 12/01/2020) 90 tablet 3   No current facility-administered medications for this encounter.    No Known Allergies  Social History   Socioeconomic History   Marital status: Divorced    Spouse name: Not on file   Number of children: 1   Years of education: Not on file   Highest education level: Not on file   Occupational History   Not on file  Tobacco Use   Smoking status: Former    Packs/day: 1.00    Years: 30.00    Pack years: 30.00    Types: Cigarettes    Quit date: 07/12/2003    Years since quitting: 17.4   Smokeless tobacco: Never  Vaping Use   Vaping Use: Never used  Substance and Sexual Activity   Alcohol use: Never   Drug use: No   Sexual activity: Yes    Partners: Male    Birth control/protection: Post-menopausal  Other Topics Concern   Not on file  Social History Narrative   Not on file   Social Determinants of Health   Financial Resource Strain: Not on file  Food Insecurity: Not on file  Transportation Needs: Not on file  Physical Activity: Not on file  Stress: Not on file  Social Connections: Not on file  Intimate Partner Violence: Not on file     ROS- All systems are reviewed and negative except as per the HPI above.  Physical Exam: Vitals:   12/01/20 1144  BP: 128/90  Pulse: 70  Weight: 107 kg  Height: '5\' 6"'$  (1.676 m)    GEN- The patient is a well appearing obese female, alert and oriented x 3 today.   Head- normocephalic, atraumatic Eyes-  Sclera clear, conjunctiva pink Ears- hearing intact Oropharynx- clear Neck- supple  Lungs- Clear to ausculation bilaterally, normal work of breathing Heart- irregular rate and rhythm, no murmurs, rubs or gallops  GI- soft, NT, ND, + BS Extremities- no clubbing, cyanosis, or edema MS- no significant deformity or atrophy Skin- no rash or lesion Psych- euthymic mood, full affect Neuro- strength and sensation are intact  Wt Readings from Last 3 Encounters:  12/01/20 107 kg  10/27/20 109.1 kg  10/20/20 108 kg    EKG today demonstrates  Afib Vent. rate 70 BPM PR interval * ms QRS duration 82 ms QT/QTcB 424/457 ms  Echo 07/28/20 demonstrated   1. Left ventricular ejection fraction, by estimation, is 60 to 65%. The  left ventricle has normal function. The left ventricle has no regional  wall motion  abnormalities. Left ventricular diastolic parameters are  indeterminate.   2. Right ventricular systolic function is normal. The right ventricular  size is normal. There is normal pulmonary artery systolic pressure.   3. Left atrial size was mild to moderately dilated.   4. The mitral valve is normal in structure. No evidence of mitral valve  regurgitation. No evidence of mitral stenosis.   5. The aortic valve is normal in structure. Aortic valve regurgitation is  not visualized. No aortic stenosis is present.   6. There is mild dilatation of the ascending aorta, measuring 38 mm.   7. The inferior vena cava is normal in size with greater than 50%  respiratory variability, suggesting right atrial pressure of 3 mmHg.   Epic records are reviewed at length today  CHA2DS2-VASc Score = 4  The patient's score is based upon: CHF History: No HTN History: No Diabetes History: Yes Stroke History: No Vascular Disease History: Yes Age Score: 1 Gender Score: 1     ASSESSMENT AND PLAN: 1. Persistent Atrial Fibrillation (ICD10:  I48.19) The patient's CHA2DS2-VASc score is 4, indicating a 4.8% annual risk of stroke.   Patient presents for dofetilide admission.  Patient aware of price of dofetilide. Continue Eliquis 5 mg BID, states no missed doses in the last 3 weeks. No recent benadryl use PharmD has screened medications. HCTZ has been discontinued. QTc in SR 478 ms, OK per Dr Curt Bears  Labs today show creatinine at 0.9, K+ 3.2 and mag 1.6, CrCl calculated at 105 mL/min. Her electrolytes will need to be repleted  prior to initiation of dofetilide.  Continue Toprol 100 mg daily  2. Secondary Hypercoagulable State (ICD10:  D68.69) The patient is at significant risk for stroke/thromboembolism based upon her CHA2DS2-VASc Score of 4.  Continue Apixaban (Eliquis).   3. Obesity Body mass index is 38.09 kg/m. Lifestyle modification was discussed at length including regular exercise and weight  reduction.  4. CAD Nonobstructive, no anginal symptoms.  5. HTN Stable, no changes today.  6. OSA The importance of adequate treatment of sleep apnea was discussed  today in order to improve our ability to maintain sinus rhythm long term. Encouraged compliance with CPAP therapy.   To be admitted later today once a bed becomes available.    [facility] [address] [place],  [address] [phone] 12/01/2020 11:55 AM

## 2020-12-01 NOTE — TOC Benefit Eligibility Note (Addendum)
Transition of Care [facility]) Benefit Eligibility Note    Patient Details  Name: Melissa Boyer MRN: [id number] Date of Birth: [birth year]   Medication/Dose: Phyllis Ginger CAPSULE  : 500 MCG BID  , 250 MCG BID , 125 MCG BID  Covered?: Yes  Tier: 3 Drug  Prescription Coverage [facility]  Spoke with Person/Company/Phone Number:: EK[employer]  @ [employer] #  [phone]  Co-Pay: ZERO DOLLARS  Prior Approval: No  Deductible Note: MET OUT-OF-POCKET:MET  Additional Note : DOFETILIDE CAPSULE : 500 MCG BID  , 250 MCG BID , 125 MCG BID :COVER-YES,CO-PAY-ZERO DOLLARS, TIER- 3 DRUG, P/A-NO     Memory Argue Phone Number: 12/01/2020, 3:08 PM

## 2020-12-02 LAB — MAGNESIUM
Magnesium: 2 mg/dL (ref 1.7–2.4)
Magnesium: 2 mg/dL (ref 1.7–2.4)

## 2020-12-02 LAB — BASIC METABOLIC PANEL
Anion gap: 12 (ref 5–15)
Anion gap: 6 (ref 5–15)
BUN: 15 mg/dL (ref 8–23)
BUN: 16 mg/dL (ref 8–23)
CO2: 23 mmol/L (ref 22–32)
CO2: 27 mmol/L (ref 22–32)
Calcium: 8.7 mg/dL — ABNORMAL LOW (ref 8.9–10.3)
Calcium: 8.9 mg/dL (ref 8.9–10.3)
Chloride: 105 mmol/L (ref 98–111)
Chloride: 103 mmol/L (ref 98–111)
Creatinine, Ser: 0.78 mg/dL (ref 0.44–1.00)
Creatinine, Ser: 0.78 mg/dL (ref 0.44–1.00)
GFR, Estimated: >60 mL/min (ref >60)
GFR, Estimated: >60 mL/min (ref >60)
Glucose, Bld: 119 mg/dL — ABNORMAL HIGH (ref 70–99)
Glucose, Bld: 129 mg/dL — ABNORMAL HIGH (ref 70–99)
Potassium: 3.9 mmol/L (ref 3.5–5.1)
Potassium: 3.7 mmol/L (ref 3.5–5.1)
Sodium: 140 mmol/L (ref 135–145)
Sodium: 136 mmol/L (ref 135–145)

## 2020-12-02 MED ORDER — POTASSIUM CHLORIDE CRYS ER 20 MEQ PO TBCR
40.0000 meq | EXTENDED_RELEASE_TABLET | Freq: Once | ORAL | Status: AC
  Administered 2020-12-02: 40 meq via ORAL
  Filled 2020-12-02: qty 2

## 2020-12-02 MED ORDER — MAGNESIUM SULFATE 2 GM/50ML IV SOLN
2.0000 g | Freq: Once | INTRAVENOUS | Status: AC
  Administered 2020-12-02: 2 g via INTRAVENOUS
  Filled 2020-12-02: qty 50

## 2020-12-02 MED ORDER — BUTALBITAL-APAP-CAFFEINE 50-325-40 MG PO TABS
1.0000 | ORAL_TABLET | Freq: Four times a day (QID) | ORAL | Status: DC | PRN
  Administered 2020-12-02 – 2020-12-03 (×3): 1 via ORAL
  Filled 2020-12-02 (×3): qty 1

## 2020-12-02 MED ORDER — METFORMIN HCL 500 MG PO TABS
500.0000 mg | ORAL_TABLET | Freq: Once | ORAL | Status: AC
  Administered 2020-12-02: 500 mg via ORAL
  Filled 2020-12-02: qty 1

## 2020-12-02 MED ORDER — METFORMIN HCL 500 MG PO TABS
1000.0000 mg | ORAL_TABLET | Freq: Every day | ORAL | Status: DC
  Administered 2020-12-03 – 2020-12-04 (×2): 1000 mg via ORAL
  Filled 2020-12-02 (×2): qty 2

## 2020-12-02 MED ORDER — METFORMIN HCL 500 MG PO TABS
500.0000 mg | ORAL_TABLET | Freq: Every day | ORAL | Status: DC
  Administered 2020-12-02 – 2020-12-03 (×2): 500 mg via ORAL
  Filled 2020-12-02 (×2): qty 1

## 2020-12-02 NOTE — Progress Notes (Signed)
K+ still down to 3.7 will hold tikosyn and give another Kdur 40 meq now and check BMP in AM Dr. Domenic Polite aware

## 2020-12-02 NOTE — Progress Notes (Signed)
Pharmacy: Dofetilide (Tikosyn) - Follow Up Assessment and Electrolyte Replacement  Pharmacy consulted to assist in monitoring and replacing electrolytes in this 65 y.o. female admitted on 12/01/2020 undergoing dofetilide initiation.   Labs:    Component Value Date/Time   K 3.9 12/02/2020 0304   MG 2.0 12/02/2020 0304     Plan: Potassium: -K 76mq given this am  Magnesium: Mg 1.8-2: Give Mg 2 gm IV x1    AHildred Laser PharmD Clinical Pharmacist **Pharmacist phone directory can now be found on [facility] (PW [email or web address]).  Listed under [place]

## 2020-12-02 NOTE — Progress Notes (Addendum)
Progress Note  Patient Name: Melissa Boyer Date of Encounter: 12/02/2020  [facility] Cardiologist: Godfrey Pick Tobb, DO   Subjective   No complaints, did not sleep well last night  Inpatient Medications    Scheduled Meds:  amLODipine  2.5 mg Oral QHS   apixaban  5 mg Oral BID   atorvastatin  20 mg Oral QHS   dofetilide  500 mcg Oral BID   gabapentin  100 mg Oral Daily   levothyroxine  125 mcg Oral QAC breakfast   losartan  100 mg Oral Daily   metFORMIN  500 mg Oral TID WC   metoprolol succinate  100 mg Oral Daily   sodium chloride flush  3 mL Intravenous Q12H   triazolam  0.25-0.5 mg Oral QHS   Continuous Infusions:  sodium chloride     PRN Meds: sodium chloride, acetaminophen, ALPRAZolam, nitroGLYCERIN, sodium chloride flush   Vital Signs    Vitals:   12/01/20 1332 12/01/20 2015 12/01/20 2349 12/02/20 0325  BP: (!) 146/92 (!) 132/43 (!) 155/73 131/81  Pulse: (!) 56 87 (!) 53 (!) 45  Resp: '18 17 18 16  '$ Temp: 98 F (36.7 C) 97.9 F (36.6 C) (!) 96.3 F (35.7 C) 98.6 F (37 C)  TempSrc: Oral Oral Axillary Oral  SpO2: 97% 96% 97% 94%  Weight: 106.6 kg     Height: '5\' 6"'$  (1.676 m)       Intake/Output Summary (Last 24 hours) at 12/02/2020 [id number] Last data filed at 12/01/2020 2300 Gross per 24 hour  Intake 840 ml  Output --  Net 840 ml   Last 3 Weights 12/01/2020 12/01/2020 10/27/2020  Weight (lbs) 235 lb 1.6 oz 236 lb 240 lb 9.6 oz  Weight (kg) 106.641 kg 107.049 kg 109.135 kg      Telemetry    AFib high 40's-50's nocturnal rates, 60's daytime - Personally Reviewed  ECG    No new EKgs - Personally Reviewed  Physical Exam   GEN: No acute distress.   Neck: No JVD Cardiac: irreg-irreg, no murmurs, rubs, or gallops.  Respiratory: CTA b/l. GI: Soft, nontender, non-distended  MS: No edema; No deformity. Neuro:  Nonfocal  Psych: Normal affect   Labs    High Sensitivity Troponin:  No results for input(s): TROPONINIHS in the last 720 hours.     Chemistry Recent Labs  Lab 12/01/20 1152 12/01/20 1848 12/02/20 0304  NA 139 139 140  K 3.2* 3.2* 3.9  CL 104 103 105  CO2 '23 26 23  '$ GLUCOSE 181* 97 119*  BUN '15 15 15  '$ CREATININE 0.90 0.82 0.78  CALCIUM 8.8* 8.8* 8.7*  GFRNONAA >60 >60 >60  ANIONGAP '12 10 12     '$ HematologyNo results for input(s): WBC, RBC, HGB, HCT, MCV, MCH, MCHC, RDW, PLT in the last 168 hours.  BNPNo results for input(s): BNP, PROBNP in the last 168 hours.   DDimer No results for input(s): DDIMER in the last 168 hours.   Radiology    No results found.  Cardiac Studies   Echo 07/28/20 demonstrated  1. Left ventricular ejection fraction, by estimation, is 60 to 65%. The  left ventricle has normal function. The left ventricle has no regional  wall motion abnormalities. Left ventricular diastolic parameters are  indeterminate.   2. Right ventricular systolic function is normal. The right ventricular  size is normal. There is normal pulmonary artery systolic pressure.   3. Left atrial size was mild to moderately dilated.   4.  The mitral valve is normal in structure. No evidence of mitral valve  regurgitation. No evidence of mitral stenosis.   5. The aortic valve is normal in structure. Aortic valve regurgitation is  not visualized. No aortic stenosis is present.   6. There is mild dilatation of the ascending aorta, measuring 38 mm.   7. The inferior vena cava is normal in size with greater than 50%  respiratory variability, suggesting right atrial pressure of 3 mmHg.  Patient Profile     65 y.o. female w/PMHx of DM,HLD, OSA w/CPAP, obesity and Afib admitted for Tikosyn initiation  Assessment & Plan    Persistent Afib CHA2DS2Vasc is 4, on Eliquis, appropriately dosed Tikosyn start delayed 2/2 hypokalemia last night K+ 3.2 > 3.2 > 3.9 this AM, d/w RN and RPH, already got 40eq this AM and OK to start drug this AM Mag 2.0 Creat 0.78  Plan DCCV tomorrow if not in SR, pt is aware and  agreeable   2. DM Home metformin  3. OSA Home CPAP  For questions or updates, please contact [place] Please consult [email or web address] for contact info under        Signed, Baldwin Jamaica, PA-C  12/02/2020, 7:28 AM    I have seen and examined this patient with Tommye Standard.  Agree with above, note added to reflect my findings.  On exam, irregular, no murmurs. Start tikosyn today. DCCV tomorrow.     Danen Lapaglia M. Maxen Rowland MD 12/02/2020 1:03 PM

## 2020-12-02 NOTE — Progress Notes (Signed)
K 3.7 from BMP drawn at 1740hrs, NP [facility] notified.

## 2020-12-03 LAB — BASIC METABOLIC PANEL
Anion gap: 11 (ref 5–15)
BUN: 16 mg/dL (ref 8–23)
CO2: 25 mmol/L (ref 22–32)
Calcium: 9.2 mg/dL (ref 8.9–10.3)
Chloride: 101 mmol/L (ref 98–111)
Creatinine, Ser: 0.77 mg/dL (ref 0.44–1.00)
GFR, Estimated: >60 mL/min (ref >60)
Glucose, Bld: 108 mg/dL — ABNORMAL HIGH (ref 70–99)
Potassium: 3.9 mmol/L (ref 3.5–5.1)
Sodium: 137 mmol/L (ref 135–145)

## 2020-12-03 LAB — MAGNESIUM: Magnesium: 1.9 mg/dL (ref 1.7–2.4)

## 2020-12-03 MED ORDER — AMIODARONE HCL 200 MG PO TABS: 400.0000 mg | ORAL_TABLET | Freq: Every day | ORAL | Status: DC

## 2020-12-03 MED ORDER — AMIODARONE HCL 200 MG PO TABS
400.0000 mg | ORAL_TABLET | Freq: Two times a day (BID) | ORAL | Status: DC
  Administered 2020-12-03 (×2): 400 mg via ORAL
  Filled 2020-12-03 (×5): qty 2

## 2020-12-03 MED ORDER — POTASSIUM CHLORIDE CRYS ER 20 MEQ PO TBCR
40.0000 meq | EXTENDED_RELEASE_TABLET | Freq: Once | ORAL | Status: DC
  Filled 2020-12-03: qty 2

## 2020-12-03 MED ORDER — MAGNESIUM SULFATE 2 GM/50ML IV SOLN
2.0000 g | Freq: Once | INTRAVENOUS | Status: DC
  Filled 2020-12-03: qty 50

## 2020-12-03 NOTE — H&P (View-Only) (Signed)
Progress Note  Patient Name: Melissa Boyer Date of Encounter: 12/03/2020  [facility] Cardiologist: Melissa Pick Tobb, DO   Subjective   Headache, no CP, SOB.  Very reluctant to proceed with tikosyn given held doses/K+  Inpatient Medications    Scheduled Meds:  amLODipine  2.5 mg Oral QHS   apixaban  5 mg Oral BID   atorvastatin  20 mg Oral QHS   dofetilide  500 mcg Oral BID   gabapentin  100 mg Oral Daily   levothyroxine  125 mcg Oral QAC breakfast   losartan  100 mg Oral Daily   metFORMIN  1,000 mg Oral Q breakfast   metFORMIN  500 mg Oral Q supper   metoprolol succinate  100 mg Oral Daily   potassium chloride  40 mEq Oral Once   sodium chloride flush  3 mL Intravenous Q12H   triazolam  0.25-0.5 mg Oral QHS   Continuous Infusions:  sodium chloride     magnesium sulfate bolus IVPB     PRN Meds: sodium chloride, acetaminophen, ALPRAZolam, butalbital-acetaminophen-caffeine, nitroGLYCERIN, sodium chloride flush   Vital Signs    Vitals:   12/02/20 2232 12/02/20 2325 12/03/20 0423 12/03/20 0535  BP: (!) 168/105 138/66 (!) 175/96 (!) 146/86  Pulse:  (!) 58 (!) 57   Resp:   18   Temp:  97.9 F (36.6 C) 98.1 F (36.7 C)   TempSrc:  Oral Oral   SpO2:  96% 98%   Weight:      Height:        Intake/Output Summary (Last 24 hours) at 12/03/2020 0737 Last data filed at 12/03/2020 0000 Gross per 24 hour  Intake 650 ml  Output --  Net 650 ml   Last 3 Weights 12/01/2020 12/01/2020 10/27/2020  Weight (lbs) 235 lb 1.6 oz 236 lb 240 lb 9.6 oz  Weight (kg) 106.641 kg 107.049 kg 109.135 kg      Telemetry    AFib high 50's nocturnal rates, 60's daytime - Personally Reviewed  ECG    No new EKGs - Personally Reviewed  Physical Exam   Exam is unchanged GEN: No acute distress.   Neck: No JVD Cardiac: irreg-irreg, no murmurs, rubs, or gallops.  Respiratory: CTA b/l. GI: Soft, nontender, non-distended  MS: No edema; No deformity. Neuro:  Nonfocal  Psych: Normal affect    Labs    High Sensitivity Troponin:  No results for input(s): TROPONINIHS in the last 720 hours.    Chemistry Recent Labs  Lab 12/02/20 0304 12/02/20 1740 12/03/20 0139  NA 140 136 137  K 3.9 3.7 3.9  CL 105 103 101  CO2 '23 27 25  '$ GLUCOSE 119* 129* 108*  BUN '15 16 16  '$ CREATININE 0.78 0.78 0.77  CALCIUM 8.7* 8.9 9.2  GFRNONAA >60 >60 >60  ANIONGAP '12 6 11     '$ HematologyNo results for input(s): WBC, RBC, HGB, HCT, MCV, MCH, MCHC, RDW, PLT in the last 168 hours.  BNPNo results for input(s): BNP, PROBNP in the last 168 hours.   DDimer No results for input(s): DDIMER in the last 168 hours.   Radiology    No results found.  Cardiac Studies   Echo 07/28/20 demonstrated  1. Left ventricular ejection fraction, by estimation, is 60 to 65%. The  left ventricle has normal function. The left ventricle has no regional  wall motion abnormalities. Left ventricular diastolic parameters are  indeterminate.   2. Right ventricular systolic function is normal. The right ventricular  size is normal. There is normal pulmonary artery systolic pressure.   3. Left atrial size was mild to moderately dilated.   4. The mitral valve is normal in structure. No evidence of mitral valve  regurgitation. No evidence of mitral stenosis.   5. The aortic valve is normal in structure. Aortic valve regurgitation is  not visualized. No aortic stenosis is present.   6. There is mild dilatation of the ascending aorta, measuring 38 mm.   7. The inferior vena cava is normal in size with greater than 50%  respiratory variability, suggesting right atrial pressure of 3 mmHg.  Patient Profile     65 y.o. female w/PMHx of DM,HLD, OSA w/CPAP, obesity and Afib admitted for Tikosyn initiation  Assessment & Plan    Persistent Afib CHA2DS2Vasc is 4, on Eliquis, appropriately dosed Tikosyn start delayed 2/2 hypokalemia last night K+ 3.9 Mag 1.9  Creat 0.77 stable  Unfortunately she did not get drug last  night 2.2 K+ 3.7 The patient no longer wants to pursue tikosyn Dr. Curt Boyer has spoken at length with her, she would like to adjust therapy strategies to ablation.  Discussed using amiodarone in the short term as a bridge to ablation likely done in in a month or so Melissa Boyer start amio today and plan DCCV tomorrow and home afterwards I have spoken to endo, Melissa Boyer move DCCV tomorrow, (they have room) if not in SR, pt is aware and agreeable   2. DM Home metformin  3. OSA Home CPAP   For questions or updates, please contact Melissa Boyer Please consult [email or web address] for contact info under        Signed, Melissa [place], PA-C  12/03/2020, 7:37 AM    I have seen and examined this patient with Melissa Boyer.  Agree with above, note added to reflect my findings.  On exam, irregular, no murmurs, lungs clear.  Patient remains in atrial fibrillation.  Did not get dose of Tikosyn yesterday.  She is quite nervous about continuing with Tikosyn as her potassium has been low on a few occasions.  Due to that, we Melissa Boyer plan to load amiodarone and cardiovert tomorrow.  She Melissa Boyer need an outpatient ablation.  Melissa Boyer M. Leylah Tarnow MD 12/03/2020 8:48 AM

## 2020-12-03 NOTE — Progress Notes (Addendum)
Progress Note  Patient Name: Nick Fronheiser Date of Encounter: 12/03/2020  [facility] Cardiologist: Godfrey Pick Tobb, DO   Subjective   Headache, no CP, SOB.  Very reluctant to proceed with tikosyn given held doses/K+  Inpatient Medications    Scheduled Meds:  amLODipine  2.5 mg Oral QHS   apixaban  5 mg Oral BID   atorvastatin  20 mg Oral QHS   dofetilide  500 mcg Oral BID   gabapentin  100 mg Oral Daily   levothyroxine  125 mcg Oral QAC breakfast   losartan  100 mg Oral Daily   metFORMIN  1,000 mg Oral Q breakfast   metFORMIN  500 mg Oral Q supper   metoprolol succinate  100 mg Oral Daily   potassium chloride  40 mEq Oral Once   sodium chloride flush  3 mL Intravenous Q12H   triazolam  0.25-0.5 mg Oral QHS   Continuous Infusions:  sodium chloride     magnesium sulfate bolus IVPB     PRN Meds: sodium chloride, acetaminophen, ALPRAZolam, butalbital-acetaminophen-caffeine, nitroGLYCERIN, sodium chloride flush   Vital Signs    Vitals:   12/02/20 2232 12/02/20 2325 12/03/20 0423 12/03/20 0535  BP: (!) 168/105 138/66 (!) 175/96 (!) 146/86  Pulse:  (!) 58 (!) 57   Resp:   18   Temp:  97.9 F (36.6 C) 98.1 F (36.7 C)   TempSrc:  Oral Oral   SpO2:  96% 98%   Weight:      Height:        Intake/Output Summary (Last 24 hours) at 12/03/2020 0737 Last data filed at 12/03/2020 0000 Gross per 24 hour  Intake 650 ml  Output --  Net 650 ml   Last 3 Weights 12/01/2020 12/01/2020 10/27/2020  Weight (lbs) 235 lb 1.6 oz 236 lb 240 lb 9.6 oz  Weight (kg) 106.641 kg 107.049 kg 109.135 kg      Telemetry    AFib high 50's nocturnal rates, 60's daytime - Personally Reviewed  ECG    No new EKGs - Personally Reviewed  Physical Exam   Exam is unchanged GEN: No acute distress.   Neck: No JVD Cardiac: irreg-irreg, no murmurs, rubs, or gallops.  Respiratory: CTA b/l. GI: Soft, nontender, non-distended  MS: No edema; No deformity. Neuro:  Nonfocal  Psych: Normal affect    Labs    High Sensitivity Troponin:  No results for input(s): TROPONINIHS in the last 720 hours.    Chemistry Recent Labs  Lab 12/02/20 0304 12/02/20 1740 12/03/20 0139  NA 140 136 137  K 3.9 3.7 3.9  CL 105 103 101  CO2 '23 27 25  '$ GLUCOSE 119* 129* 108*  BUN '15 16 16  '$ CREATININE 0.78 0.78 0.77  CALCIUM 8.7* 8.9 9.2  GFRNONAA >60 >60 >60  ANIONGAP '12 6 11     '$ HematologyNo results for input(s): WBC, RBC, HGB, HCT, MCV, MCH, MCHC, RDW, PLT in the last 168 hours.  BNPNo results for input(s): BNP, PROBNP in the last 168 hours.   DDimer No results for input(s): DDIMER in the last 168 hours.   Radiology    No results found.  Cardiac Studies   Echo 07/28/20 demonstrated  1. Left ventricular ejection fraction, by estimation, is 60 to 65%. The  left ventricle has normal function. The left ventricle has no regional  wall motion abnormalities. Left ventricular diastolic parameters are  indeterminate.   2. Right ventricular systolic function is normal. The right ventricular  size is normal. There is normal pulmonary artery systolic pressure.   3. Left atrial size was mild to moderately dilated.   4. The mitral valve is normal in structure. No evidence of mitral valve  regurgitation. No evidence of mitral stenosis.   5. The aortic valve is normal in structure. Aortic valve regurgitation is  not visualized. No aortic stenosis is present.   6. There is mild dilatation of the ascending aorta, measuring 38 mm.   7. The inferior vena cava is normal in size with greater than 50%  respiratory variability, suggesting right atrial pressure of 3 mmHg.  Patient Profile     65 y.o. female w/PMHx of DM,HLD, OSA w/CPAP, obesity and Afib admitted for Tikosyn initiation  Assessment & Plan    Persistent Afib CHA2DS2Vasc is 4, on Eliquis, appropriately dosed Tikosyn start delayed 2/2 hypokalemia last night K+ 3.9 Mag 1.9  Creat 0.77 stable  Unfortunately she did not get drug last  night 2.2 K+ 3.7 The patient no longer wants to pursue tikosyn Dr. Curt Bears has spoken at length with her, she would like to adjust therapy strategies to ablation.  Discussed using amiodarone in the short term as a bridge to ablation likely done in in a month or so Joell Usman start amio today and plan DCCV tomorrow and home afterwards I have spoken to endo, Miriana Gaertner move DCCV tomorrow, (they have room) if not in SR, pt is aware and agreeable   2. DM Home metformin  3. OSA Home CPAP   For questions or updates, please contact [place] Please consult [email or web address] for contact info under        Signed, Baldwin Jamaica, PA-C  12/03/2020, 7:37 AM    I have seen and examined this patient with Tommye Standard.  Agree with above, note added to reflect my findings.  On exam, irregular, no murmurs, lungs clear.  Patient remains in atrial fibrillation.  Did not get dose of Tikosyn yesterday.  She is quite nervous about continuing with Tikosyn as her potassium has been low on a few occasions.  Due to that, we Landis Dowdy plan to load amiodarone and cardiovert tomorrow.  She Davette Nugent need an outpatient ablation.  Jazelyn Sipe M. Kenlynn Houde MD 12/03/2020 8:48 AM

## 2020-12-03 NOTE — Anesthesia Preprocedure Evaluation (Addendum)
Anesthesia Evaluation  Patient identified by MRN, date of birth, ID band Patient awake    Reviewed: Allergy & Precautions, NPO status , Patient's Chart, lab work & pertinent test results  Airway Mallampati: III  TM Distance: >3 FB Neck ROM: Full    Dental  (+) Chipped, Dental Advisory Given,    Pulmonary sleep apnea and Continuous Positive Airway Pressure Ventilation , former smoker,    Pulmonary exam normal breath sounds clear to auscultation       Cardiovascular hypertension, Pt. on medications and Pt. on home beta blockers + CAD  Normal cardiovascular exam+ dysrhythmias (on eliquis, no missed doses) Atrial Fibrillation  Rhythm:Irregular Rate:Normal  TTE 2022 1. Left ventricular ejection fraction, by estimation, is 60 to 65%. The  left ventricle has normal function.  2. Right ventricular systolic function is normal. The right ventricular  size is normal.  3. Left atrial size was moderately dilated. No left atrial/left atrial  appendage thrombus was detected. The LAA emptying velocity was 27 cm/s.  4. Right atrial size was mildly dilated.  5. The mitral valve is normal in structure. Mild mitral valve  regurgitation.  6. The aortic valve is normal in structure. Aortic valve regurgitation is  trivial.  7. Aortic dilatation noted. There is borderline dilatation of the aortic  root and of the ascending aorta, measuring 38 mm. There is Moderate (Grade  III) atheroma plaque involving the descending aorta.  8. Agitated saline contrast bubble study was positive with shunting  observed after >6 cardiac cycles suggestive of intrapulmonary shunting.  Bubbles noted in descending aorta 2-3 minutes after injection.    Neuro/Psych negative neurological ROS  negative psych ROS   GI/Hepatic negative GI ROS, (+) Hepatitis -, C  Endo/Other  diabetes, Type 2, Oral Hypoglycemic AgentsHypothyroidism Morbid obesity  Renal/GU negative  Renal ROS  negative genitourinary   Musculoskeletal negative musculoskeletal ROS (+)   Abdominal   Peds  Hematology negative hematology ROS (+)   Anesthesia Other Findings   Reproductive/Obstetrics                           Anesthesia Physical Anesthesia Plan  ASA: 3  Anesthesia Plan: General   Post-op Pain Management:    Induction: Intravenous  PONV Risk Score and Plan: 3 and Treatment may vary due to age or medical condition and Propofol infusion  Airway Management Planned: Mask  Additional Equipment: None  Intra-op Plan:   Post-operative Plan:   Informed Consent:   Plan Discussed with:   Anesthesia Plan Comments: (     )       Anesthesia Quick Evaluation

## 2020-12-04 ENCOUNTER — Encounter (HOSPITAL_COMMUNITY)
Admission: RE | Disposition: A | Discharge status: Home / Self Care | Payer: Self-pay | Source: Ambulatory Visit | Attending: Cardiology

## 2020-12-04 ENCOUNTER — Encounter (HOSPITAL_COMMUNITY): Payer: Self-pay | Admitting: Cardiology

## 2020-12-04 ENCOUNTER — Inpatient Hospital Stay (HOSPITAL_COMMUNITY): Payer: 59 | Admitting: Anesthesiology

## 2020-12-04 HISTORY — PX: CARDIOVERSION: SHX1299

## 2020-12-04 LAB — BASIC METABOLIC PANEL
Anion gap: 11 (ref 5–15)
BUN: 19 mg/dL (ref 8–23)
CO2: 20 mmol/L — ABNORMAL LOW (ref 22–32)
Calcium: 8.9 mg/dL (ref 8.9–10.3)
Chloride: 105 mmol/L (ref 98–111)
Creatinine, Ser: 0.89 mg/dL (ref 0.44–1.00)
GFR, Estimated: >60 mL/min (ref >60)
Glucose, Bld: 133 mg/dL — ABNORMAL HIGH (ref 70–99)
Potassium: 3.9 mmol/L (ref 3.5–5.1)
Sodium: 136 mmol/L (ref 135–145)

## 2020-12-04 SURGERY — CARDIOVERSION
Anesthesia: General

## 2020-12-04 MED ORDER — PROPOFOL 10 MG/ML IV BOLUS
INTRAVENOUS | Status: DC | PRN
  Administered 2020-12-04: 20 mg via INTRAVENOUS
  Administered 2020-12-04 (×2): 30 mg via INTRAVENOUS
  Administered 2020-12-04 (×2): 50 mg via INTRAVENOUS

## 2020-12-04 MED ORDER — LIDOCAINE 2% (20 MG/ML) 5 ML SYRINGE
INTRAMUSCULAR | Status: DC | PRN
  Administered 2020-12-04: 100 mg via INTRAVENOUS

## 2020-12-04 MED ORDER — METOPROLOL SUCCINATE ER 50 MG PO TB24: 50.0000 mg | ORAL_TABLET | Freq: Every day | ORAL | 6 refills | Status: DC

## 2020-12-04 MED ORDER — AMIODARONE HCL 200 MG PO TABS
200.0000 mg | ORAL_TABLET | Freq: Every day | ORAL | 6 refills | Status: DC
Start: 2020-12-05 — End: 2021-06-25

## 2020-12-04 MED ORDER — AMIODARONE HCL 200 MG PO TABS
200.0000 mg | ORAL_TABLET | Freq: Every day | ORAL | Status: DC
  Administered 2020-12-04: 200 mg via ORAL
  Filled 2020-12-04: qty 1

## 2020-12-04 MED ORDER — SODIUM CHLORIDE 0.9 % IV SOLN: INTRAVENOUS | Status: DC | PRN

## 2020-12-04 MED ORDER — METOPROLOL SUCCINATE ER 50 MG PO TB24: 50.0000 mg | ORAL_TABLET | Freq: Every day | ORAL | Status: DC

## 2020-12-04 NOTE — Discharge Summary (Addendum)
ELECTROPHYSIOLOGY PROCEDURE DISCHARGE SUMMARY    Patient ID: Melissa Boyer,  MRN: [id number], DOB/AGE: 1955-12-31 65 y.o.  Admit date: 12/01/2020 Discharge date: 12/04/2020  Primary Care Physician: Cher Nakai, MD  Primary Cardiologist: Dr. Harriet Masson Electrophysiologist: Dr. Curt Bears  Primary Discharge Diagnosis:  1.  persistent atrial fibrillation status post Tikosyn loading this admission      CHA2DS2Vasc is 4, on Eliquis  Secondary Discharge Diagnosis:  HTN DM OSA  No Known Allergies   Procedures This Admission:  1.  Tikosyn loading 2.  Direct current cardioversion on 12/04/20 by Dr Harrell Gave that was unsuccessful in restroring SR despite 3 shocks and using pressure   Brief HPI: Melissa Boyer is a 65 y.o. female with a past medical history as noted above.  They were referred to EP in the outpatient setting for treatment options of atrial fibrillation.  Risks, benefits, and alternatives to Tikosyn were reviewed with the patient who wished to proceed.    Hospital Course:  The patient was admitted and Tikosyn was initiated.  Renal function and electrolytes were followed during the hospitalization.  Her potassium required significant supplementation and delayed her drug start, unfortunately with improvement in her potassium with additional need for potassium supplementation to get her K+ to 4.0 or better, she was too uncomfortable to proceed with Tikosyn.  Was decided in lengthy conversation with the patient by Dr. Curt Bears to plan for amiodarone as a bridge to ablation.  She was started on amiodarone and planned for DCCV.  Unfortunately cardioversion was unsuccessful.  Telemetry noted AFib rate controlled generally 60's-70's, slower nocturnal rates.  This morning day of discharge HR 50's-60's and Zekiah Caruth discharge on '20mg'$  daily of amiodarone and reduce her Toprol to '50mg'$  daily Her BP here has been unusually high for her , though admittedly she mentions she has been quite  anxious/nervous about the loading process, and this morning is better.   On the day of discharge, she feels OK, no CP, no SOB, AFib makes her feel fatigued, but feeling well otherwise,  the patient was examined by Dr Curt Bears who considered her stable for discharge to home.  Follow-up has been arranged with the AFib clinic in 1 week and with Dr Curt Bears in 4 weeks.   Dr. Curt Bears has reached out to his RN to start process of ablation scheduling.  Her HCTZ was stopped in prep for Tikosyn, this was likely the source of her hypokalemia, should she reuire it be added back fr BP in the future, may need to consider K+ supplementation   Physical Exam: Vitals:   12/04/20 0810 12/04/20 0820 12/04/20 0831 12/04/20 0846  BP: 114/76 119/75 (!) 138/96 140/70  Pulse: (!) 55 (!) 56 (!) 59 (!) 52  Resp: '16 16 14   '$ Temp: 97.8 F (36.6 C)   97.6 F (36.4 C)  TempSrc: Oral   Oral  SpO2: 99% 93% 93%   Weight:      Height:         GEN- The patient is well appearing, alert and oriented x 3 today.   HEENT: normocephalic, atraumatic; sclera clear, conjunctiva pink; hearing intact; oropharynx clear; neck supple, no JVP Lymph- no cervical lymphadenopathy Lungs- CTA b/l, normal work of breathing.  No wheezes, rales, rhonchi Heart- irreg-irreg, no murmurs, rubs or gallops, PMI not laterally displaced GI- soft, non-tender, non-distended Extremities- no clubbing, cyanosis, or edema MS- no significant deformity or atrophy Skin- warm and dry, no rash or lesion Psych- euthymic mood,  full affect Neuro- strength and sensation are intact   Labs:   Lab Results  Component Value Date   WBC 7.9 10/14/2020   HGB 15.6 10/14/2020   HCT 45.5 10/14/2020   MCV 89 10/14/2020   PLT 249 10/14/2020    Recent Labs  Lab 12/04/20 0258  NA 136  K 3.9  CL 105  CO2 20*  BUN 19  CREATININE 0.89  CALCIUM 8.9  GLUCOSE 133*     Discharge Medications:  Allergies as of 12/04/2020   No Known Allergies      Medication  List     STOP taking these medications    aspirin EC 81 MG tablet       TAKE these medications    acetaminophen 650 MG CR tablet Commonly known as: TYLENOL Take 1,300 mg by mouth every 8 (eight) hours as needed for pain.   ALPRAZolam 0.5 MG tablet Commonly known as: XANAX Take 0.25 mg by mouth daily as needed for anxiety.   amiodarone 200 MG tablet Commonly known as: PACERONE Take 1 tablet (200 mg total) by mouth daily. Start taking on: December 05, 2020   amLODipine 5 MG tablet Commonly known as: NORVASC Take 2.5 mg by mouth at bedtime.   apixaban 5 MG Tabs tablet Commonly known as: ELIQUIS Take 1 tablet (5 mg total) by mouth 2 (two) times daily.   atorvastatin 20 MG tablet Commonly known as: LIPITOR Take 20 mg by mouth every evening.   gabapentin 100 MG capsule Commonly known as: NEURONTIN Take 100 mg by mouth daily.   hydrochlorothiazide 12.5 MG tablet Commonly known as: HYDRODIURIL Take 12.5 mg by mouth daily.   levothyroxine 125 MCG tablet Commonly known as: SYNTHROID Take 125 mcg by mouth daily before breakfast.   loratadine 10 MG tablet Commonly known as: CLARITIN Take 10 mg by mouth daily as needed for allergies.   losartan 100 MG tablet Commonly known as: COZAAR Take 100 mg by mouth daily.   meloxicam 7.5 MG tablet Commonly known as: MOBIC Take 3.75-7.5 mg by mouth daily as needed for pain.   metFORMIN 500 MG tablet Commonly known as: GLUCOPHAGE Take 1 tablet (500 mg total) by mouth 2 (two) times daily with a meal. What changed:  when to take this additional instructions Notes to patient: continue to take as you have been instructed to by the prescribing doctor   metoprolol succinate 50 MG 24 hr tablet Commonly known as: TOPROL-XL Take 1 tablet (50 mg total) by mouth daily. Take with or immediately following a meal. Start taking on: December 05, 2020 What changed:  medication strength how much to take additional instructions    nitroGLYCERIN 0.4 MG SL tablet Commonly known as: NITROSTAT Place 1 tablet (0.4 mg total) under the tongue every 5 (five) minutes as needed for chest pain.   triazolam 0.25 MG tablet Commonly known as: HALCION Take 0.5 mg by mouth at bedtime as needed for sleep.        Disposition: Home Discharge Instructions     Diet - low sodium heart healthy   Complete by: As directed    Increase activity slowly   Complete by: As directed        Follow-up Information     Constance Haw, MD Follow up.   Specialty: Cardiology Why: 01/01/21 @ 4:15PM, Dr. Macky Lower nurse with be in contact with you about next steps in planning your ablation procedure Contact information: [address] [address]  [phone]                 Duration of Discharge Encounter: Greater than 30 minutes including physician time.  Venetia Night, PA-C 12/04/2020 11:39 AM  I have seen and examined this patient with Tommye Standard.  Agree with above, note added to reflect my findings.  On exam, irregular, no murmurs.  Did not convert to sinus rhythm. Due to that, Caylea Foronda need AF ablation. Discharge today and Morganne Haile arrange as outpatient.  Megin Consalvo M. Jeraldin Fesler MD 12/04/2020 11:45 AM

## 2020-12-04 NOTE — CV Procedure (Signed)
Procedure:   DCCV  Indication:  Symptomatic atrial fibrillation  Procedure Note:  The patient signed informed consent.  They have had had therapeutic anticoagulation with apixaban greater than 3 weeks.  Anesthesia was administered by Dr. Lanetta Inch.  Patient received 100 mg IV lidocaine and 180 mg IV propofol.Adequate airway was maintained throughout and vital followed per protocol.  They were cardioverted x 3 with 120, 150, 200J of biphasic synchronized energy with anterior pressure.  She remained in atrial fibrillation.  There were no apparent complications.  The patient had normal neuro status and respiratory status post procedure with vitals stable as recorded elsewhere.    Follow up:  They will continue on current medical therapy and follow up with cardiology as scheduled.  Buford Dresser, MD PhD 12/04/2020 8:06 AM

## 2020-12-04 NOTE — Anesthesia Procedure Notes (Signed)
Procedure Name: General with mask airway Date/Time: 12/04/2020 7:46 AM Performed by: Kathryne Hitch, CRNA Pre-anesthesia Checklist: Emergency Drugs available, Suction available, Patient being monitored and Patient identified Patient Re-evaluated:Patient Re-evaluated prior to induction Oxygen Delivery Method: Simple face mask Preoxygenation: Pre-oxygenation with 100% oxygen Induction Type: IV induction Dental Injury: Teeth and Oropharynx as per pre-operative assessment

## 2020-12-04 NOTE — Anesthesia Postprocedure Evaluation (Signed)
Anesthesia Post Note  Patient: Melissa Boyer  Procedure(s) Performed: CARDIOVERSION     Patient location during evaluation: PACU Anesthesia Type: General Level of consciousness: awake and alert Pain management: pain level controlled Vital Signs Assessment: post-procedure vital signs reviewed and stable Respiratory status: spontaneous breathing, nonlabored ventilation, respiratory function stable and patient connected to nasal cannula oxygen Cardiovascular status: blood pressure returned to baseline and stable Postop Assessment: no apparent nausea or vomiting Anesthetic complications: no   No notable events documented.  Last Vitals:  Vitals:   12/04/20 0831 12/04/20 0846  BP: (!) 138/96 140/70  Pulse: (!) 59 (!) 52  Resp: 14   Temp:  36.4 C  SpO2: 93%     Last Pain:  Vitals:   12/04/20 0941  TempSrc:   PainSc: 0-No pain                 Turrell Severt L Mamie Hundertmark

## 2020-12-04 NOTE — Transfer of Care (Signed)
Immediate Anesthesia Transfer of Care Note  Patient: Melissa Boyer  Procedure(s) Performed: CARDIOVERSION  Patient Location: Endoscopy Unit  Anesthesia Type:General  Level of Consciousness: drowsy and patient cooperative  Airway & Oxygen Therapy: Patient Spontanous Breathing and Patient connected to face mask oxygen  Post-op Assessment: Report given to RN and Post -op Vital signs reviewed and stable  Post vital signs: Reviewed and stable  Last Vitals:  Vitals Value Taken Time  BP 119/78   Temp    Pulse 56   Resp 19   SpO2 98     Last Pain:  Vitals:   12/04/20 0657  TempSrc: Oral  PainSc: 0-No pain      Patients Stated Pain Goal: 0 ([phone])  Complications: No notable events documented.

## 2020-12-04 NOTE — Interval H&P Note (Signed)
History and Physical Interval Note:  12/04/2020 7:34 AM  Manus Gunning  has presented today for surgery, with the diagnosis of afib.  The various methods of treatment have been discussed with the patient and family. After consideration of risks, benefits and other options for treatment, the patient has consented to  Procedure(s): CARDIOVERSION (N/A) as a surgical intervention.  The patient's history has been reviewed, patient examined, no change in status, stable for surgery.  I have reviewed the patient's chart and labs.  Questions were answered to the patient's satisfaction.     Danyael Alipio Harrell Gave

## 2020-12-05 ENCOUNTER — Encounter (HOSPITAL_COMMUNITY): Payer: Self-pay | Admitting: Cardiology

## 2020-12-15 ENCOUNTER — Telehealth: Payer: Self-pay | Admitting: Cardiology

## 2020-12-15 NOTE — Telephone Encounter (Signed)
Returned call to Pt.  Pt is not sure what her heart rate is.    She knows she feels "fatigued".  Unable to determine if she is symptomatic from a low heart rate versus how she feels when she is in afib-currently in afib.  Pt is wanting to have her ablation scheduled ASAP.  Advised would send some potential ablation dates via [place].  Encouraged Pt to continue amiodarone at this time, attempted to reassure Pt.

## 2020-12-15 NOTE — Telephone Encounter (Signed)
Pt c/o medication issue: 1. Name of Medication: Amiodarone  2. How are you currently taking this medication (dosage and times per day)? One a day 3. Are you having a reaction (difficulty breathing--STAT)?  No  4. What is your medication issue? Patient already has a low heart HR and it is making it lower.

## 2020-12-18 DIAGNOSIS — K429 Umbilical hernia without obstruction or gangrene: Secondary | ICD-10-CM | POA: Insufficient documentation

## 2020-12-19 ENCOUNTER — Ambulatory Visit (HOSPITAL_BASED_OUTPATIENT_CLINIC_OR_DEPARTMENT_OTHER): Payer: 59 | Attending: Pulmonary Disease | Admitting: Pulmonary Disease

## 2020-12-19 ENCOUNTER — Other Ambulatory Visit: Payer: Self-pay

## 2020-12-19 DIAGNOSIS — G4733 Obstructive sleep apnea (adult) (pediatric): Secondary | ICD-10-CM | POA: Insufficient documentation

## 2020-12-19 DIAGNOSIS — G4736 Sleep related hypoventilation in conditions classified elsewhere: Secondary | ICD-10-CM | POA: Diagnosis not present

## 2020-12-19 DIAGNOSIS — R0683 Snoring: Secondary | ICD-10-CM

## 2020-12-19 DIAGNOSIS — R0902 Hypoxemia: Secondary | ICD-10-CM | POA: Diagnosis not present

## 2020-12-27 ENCOUNTER — Telehealth: Payer: Self-pay | Admitting: Pulmonary Disease

## 2020-12-27 DIAGNOSIS — Z9989 Dependence on other enabling machines and devices: Secondary | ICD-10-CM

## 2020-12-27 DIAGNOSIS — G4733 Obstructive sleep apnea (adult) (pediatric): Secondary | ICD-10-CM

## 2020-12-27 NOTE — Telephone Encounter (Signed)
Call patient  Sleep study result  Date of study: 12/19/2020  Impression: Mild obstructive sleep apnea Mild oxygen desaturations  Recommendation: Recommend treatment of mild obstructive sleep apnea in the presence of atrial fibrillation with CPAP therapy  Auto titrating CPAP pressure settings of 5-15 with patient's mask of choice, used [employer] DreamWear nasal pillows during recent sleep study  Close clinical follow-up for optimization of therapy  Optimize sleep hygiene Avoid alcohol, sedatives as able

## 2020-12-27 NOTE — Procedures (Signed)
POLYSOMNOGRAPHY  Last, First: Melissa Boyer MRN: [id number] Gender: Female Age (years): 65 Weight (lbs): 195 DOB: 05-Nov-1955 BMI: 31 Primary Care: No PCP Epworth Score: 3 Referring: Laurin Coder MD Technician: Baxter Flattery Interpreting: Laurin Coder MD Study Type: NPSG Ordered Study Type: Split Night CPAP Study date: 12/19/2020 Location: [place] CLINICAL INFORMATION Melissa Boyer is a 65 year old Female and was referred to the sleep center for evaluation of G47.80 Other Sleep Disorders. Indications include Snoring, Witnesses Apnea / Gasping During Sleep.  MEDICATIONS Patient self administered medications include: GABAPENTIN, TRAZODONE. Medications administered during study include No sleep medicine administered.  SLEEP STUDY TECHNIQUE A multi-channel overnight Polysomnography study was performed. The channels recorded and monitored were central and occipital EEG, electrooculogram (EOG), submentalis EMG (chin), nasal and oral airflow, thoracic and abdominal wall motion, anterior tibialis EMG, snore microphone, electrocardiogram, and a pulse oximetry. TECHNICIAN COMMENTS Comments added by Technician: Patient was restless all through the night. Comments added by Scorer: N/A SLEEP ARCHITECTURE The study was initiated at 10:14:38 PM and terminated at 5:05:55 AM. The total recorded time was 411.3 minutes. EEG confirmed total sleep time was 375 minutes yielding a sleep efficiency of 91.2%%. Sleep onset after lights out was 14.6 minutes with a REM latency of 70.0 minutes. The patient spent 2.5%% of the night in stage N1 sleep, 78.4%% in stage N2 sleep, 0.0%% in stage N3 and 19.1% in REM. Wake after sleep onset (WASO) was 21.6 minutes. The Arousal Index was 10.6/hour. RESPIRATORY PARAMETERS There were a total of 69 respiratory disturbances out of which 19 were apneas ( 19 obstructive, 0 mixed, 0 central) and 50 hypopneas. The apnea/hypopnea index (AHI) was 11.0 events/hour. The  central sleep apnea index was 0 events/hour. The REM AHI was 47.8 events/hour and NREM AHI was 2.4 events/hour. The supine AHI was 9.7 events/hour and the non supine AHI was 16.4 supine during 79.46% of sleep. Respiratory disturbances were associated with oxygen desaturation down to a nadir of 77.0% during sleep. The mean oxygen saturation during the study was 90.5%. The cumulative time under 88% oxygen saturation was 5.5 minutes.  LEG MOVEMENT DATA The total leg movements were 0 with a resulting leg movement index of 0.0/hr .Associated arousal with leg movement index was 0.0/hr.  CARDIAC DATA The underlying cardiac rhythm was most consistent with sinus rhythm. Mean heart rate during sleep was 56.5 bpm. Additional rhythm abnormalities include Atrial Fibrillation.  IMPRESSIONS - Mild Obstructive Sleep apnea(OSA) - Electrocardiographic data showed presence of Atrial Fibrillation. - Mild Oxygen Desaturation - The patient snored with loud snoring volume. - No significant periodic leg movements(PLMs) during sleep. However, no significant associated arousals.  DIAGNOSIS - Obstructive Sleep Apnea (G47.33) - Nocturnal Hypoxemia (G47.36)  RECOMMENDATIONS - Treatment is recommended with mild obstructive sleep apnea and presence of atrial fibrillation. - Therapeutic CPAP titration to determine optimal pressure required to alleviate sleep disordered breathing. - Auto titrating CPAP with CPAP pressures of 5 to 15 with close clinical follow-up  - Avoid alcohol, sedatives and other CNS depressants that may worsen sleep apnea and disrupt normal sleep architecture. - Sleep hygiene should be reviewed to assess factors that may improve sleep quality. - Weight management and regular exercise should be initiated or continued.  [Electronically signed] 12/27/2020 10:52 AM  Sherrilyn Rist MD NPI: [id number]

## 2020-12-31 NOTE — Telephone Encounter (Signed)
I called and spoke with patient regarding HST results and Dr. Doreene Eland. Patient verbalized understanding and wanted printed off script for machine. I have printed off the script and had Dr. Jenetta Downer sign it. Will fax to number given. Nothing further needed.

## 2021-01-01 ENCOUNTER — Ambulatory Visit: Payer: 59 | Admitting: Cardiology

## 2021-01-01 ENCOUNTER — Ambulatory Visit (INDEPENDENT_AMBULATORY_CARE_PROVIDER_SITE_OTHER): Payer: 59 | Admitting: Cardiology

## 2021-01-01 ENCOUNTER — Encounter: Payer: Self-pay | Admitting: Cardiology

## 2021-01-01 ENCOUNTER — Other Ambulatory Visit: Payer: Self-pay

## 2021-01-01 VITALS — BP 130/72 | HR 62 | Ht 67.0 in | Wt 233.4 lb

## 2021-01-01 DIAGNOSIS — I4819 Other persistent atrial fibrillation: Secondary | ICD-10-CM | POA: Diagnosis not present

## 2021-01-01 DIAGNOSIS — I2510 Atherosclerotic heart disease of native coronary artery without angina pectoris: Secondary | ICD-10-CM

## 2021-01-01 NOTE — Progress Notes (Signed)
Electrophysiology Office Note   Date:  01/01/2021   ID:  Melissa Boyer, DOB 01/01/1956, MRN [id number]  PCP:  Cher Nakai, MD  Cardiologist:  [place] Primary Electrophysiologist:  Jerico Grisso Meredith Leeds, MD    Chief Complaint: AF   History of Present Illness: Melissa Boyer is a 65 y.o. female who is being seen today for the evaluation of AF at the request of Cher Nakai, MD. Presenting today for electrophysiology evaluation.  She has a history significant for diabetes, hyperlipidemia, atrial fibrillation, sleep apnea, former tobacco abuse, nonobstructive coronary artery disease.  She had a cardiac monitor that showed a 100% atrial fibrillation burden.  She was admitted to the hospital for dofetilide, but not convert with cardioversion.  She was since loaded on amiodarone.  She initially had plans for ablation on 01/30/2021, though she has been having issues with ventral hernia and kidney lesions.  As she potentially has upcoming surgery, Melissa Boyer  Today, denies symptoms of palpitations, chest pain, shortness of breath, orthopnea, PND, lower extremity edema, claudication, dizziness, presyncope, syncope, bleeding, or neurologic sequela. The patient is tolerating medications without difficulties.  She continues to feel weak and fatigued with shortness of breath.  She has felt better over the last few weeks since being in the hospital.  She would like to get back into normal rhythm.   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    Hypercholesteremia    Hypertension    Left renal mass 12/29/2019   Mixed hyperlipidemia 02/27/2019   Obesity (BMI 35.0-39.9 without comorbidity) 12/29/2019   Obstructive sleep apnea on CPAP 06/19/2019   Paroxysmal atrial fibrillation ([place]) 02/27/2019   Pure hypercholesterolemia 01/01/2014   Thyroid disease    hypothyroid   Unspecified hypothyroidism 01/01/2014   Past Surgical History:  Procedure Laterality Date   BUBBLE STUDY  10/16/2020   Procedure: BUBBLE STUDY;  Surgeon: Elouise Munroe, MD;  Location: [facility];  Service: Cardiovascular;;   CARDIOVERSION N/A 10/16/2020   Procedure: CARDIOVERSION;  Surgeon: Elouise Munroe, MD;  Location: [facility];  Service: Cardiovascular;  Laterality: N/A;   CARDIOVERSION N/A 12/04/2020   Procedure: CARDIOVERSION;  Surgeon: Buford Dresser, MD;  Location: [facility];  Service: Cardiovascular;  Laterality: N/A;   COLPOSCOPY  10/98   normal, no biopsy   ENDOMETRIAL ABLATION  12/02/2002   ENDOMETRIAL BIOPSY  5/03   IR RADIOLOGIST EVAL & MGMT  11/05/2020   RIGHT/LEFT HEART CATH AND CORONARY ANGIOGRAPHY N/A 07/18/2020   Procedure: RIGHT/LEFT HEART CATH AND CORONARY ANGIOGRAPHY;  Surgeon: Jettie Booze, MD;  Location: [facility];  Service: Cardiovascular;  Laterality: N/A;   TEE WITHOUT CARDIOVERSION N/A 10/16/2020   Procedure: TRANSESOPHAGEAL ECHOCARDIOGRAM (TEE);  Surgeon: Elouise Munroe, MD;  Location: [facility] ENDOSCOPY;  Service: Cardiovascular;  Laterality: N/A;   THYROIDECTOMY, PARTIAL     thyroid nodule excision left side     Current Outpatient Medications  Medication Sig Dispense Refill  acetaminophen (TYLENOL) 650 MG CR tablet Take 1,300 mg by mouth every 8 (eight) hours as needed for pain.     ALPRAZolam (XANAX) 0.5 MG tablet Take 0.25 mg by mouth daily as needed for anxiety.     amiodarone (PACERONE) 200 MG tablet Take 1 tablet (200 mg total) by mouth daily. 30 tablet 6   amLODipine (NORVASC) 5 MG tablet Take 2.5  mg by mouth at bedtime.     apixaban (ELIQUIS) 5 MG TABS tablet Take 1 tablet (5 mg total) by mouth 2 (two) times daily. 60 tablet 13   atorvastatin (LIPITOR) 20 MG tablet Take 20 mg by mouth every evening.     gabapentin (NEURONTIN) 100 MG capsule Take 100 mg by mouth daily.     levothyroxine (SYNTHROID, LEVOTHROID) 125 MCG tablet Take 125 mcg by mouth daily before breakfast.     loratadine (CLARITIN) 10 MG tablet Take 10 mg by mouth daily as needed for allergies.     losartan (COZAAR) 100 MG tablet Take 100 mg by mouth daily.     meloxicam (MOBIC) 7.5 MG tablet Take 3.75-7.5 mg by mouth daily as needed for pain.     metFORMIN (GLUCOPHAGE) 500 MG tablet Take 1 tablet (500 mg total) by mouth 2 (two) times daily with a meal.     metoprolol succinate (TOPROL-XL) 50 MG 24 hr tablet Take 1 tablet (50 mg total) by mouth daily. Take with or immediately following a meal. 30 tablet 6   nitroGLYCERIN (NITROSTAT) 0.4 MG SL tablet Place 1 tablet (0.4 mg total) under the tongue every 5 (five) minutes as needed for chest pain. 90 tablet 3   triazolam (HALCION) 0.25 MG tablet Take 0.5 mg by mouth at bedtime as needed for sleep.     hydrochlorothiazide (HYDRODIURIL) 12.5 MG tablet Take 12.5 mg by mouth daily. (Patient not taking: Reported on 01/01/2021)     No current facility-administered medications for this visit.    Allergies:   Patient has no known allergies.   Social History:  The patient  reports that she quit smoking about 17 years ago. Her smoking use included cigarettes. She has a 30.00 pack-year smoking history. She has never used smokeless tobacco. She reports that she does not drink alcohol and does not use drugs.   Family History:  The patient's family history includes Cancer in her maternal aunt, mother, and sister; Diabetes in her maternal grandfather; Emphysema in her father; Heart attack in her maternal aunt and maternal grandmother; Hypertension in her father.   ROS:  Please see the history  of present illness.   Otherwise, review of systems is positive for none.   All other systems are reviewed and negative.   PHYSICAL EXAM: VS:  BP 130/72   Pulse 62   Ht '5\' 7"'$  (1.702 m)   Wt 233 lb 6.4 oz (105.9 kg)   LMP 12/02/2002 (Approximate)   SpO2 98%   BMI 36.56 kg/m  , BMI Body mass index is 36.56 kg/m. GEN: Well nourished, well developed, in no acute distress  HEENT: normal  Neck: no JVD, carotid bruits, or masses Cardiac: irregularRRR; no murmurs, rubs, or gallops,no edema  Respiratory:  clear to auscultation bilaterally, normal work of breathing GI: soft, nontender, nondistended, + BS MS: no deformity or atrophy  Skin: warm and dry Neuro:  Strength and sensation are intact Psych: euthymic mood, full affect  EKG:  EKG is ordered today. Personal review of the ekg ordered shows atrial fibrillation  Recent Labs:  10/14/2020: Hemoglobin 15.6; Platelets 249 12/03/2020: Magnesium 1.9 12/04/2020: BUN 19; Creatinine, Ser 0.89; Potassium 3.9; Sodium 136    Lipid Panel  No results found for: CHOL, TRIG, HDL, CHOLHDL, VLDL, LDLCALC, LDLDIRECT   Wt Readings from Last 3 Encounters:  01/01/21 233 lb 6.4 oz (105.9 kg)  12/19/20 235 lb (106.6 kg)  12/01/20 235 lb 1.6 oz (106.6 kg)      Other studies Reviewed: Additional studies/ records that were reviewed today include: TTE 07/28/20  Review of the above records today demonstrates:   1. Left ventricular ejection fraction, by estimation, is 60 to 65%. The  left ventricle has normal function. The left ventricle has no regional  wall motion abnormalities. Left ventricular diastolic parameters are  indeterminate.   2. Right ventricular systolic function is normal. The right ventricular  size is normal. There is normal pulmonary artery systolic pressure.   3. Left atrial size was mild to moderately dilated.   4. The mitral valve is normal in structure. No evidence of mitral valve  regurgitation. No evidence of mitral stenosis.   5.  The aortic valve is normal in structure. Aortic valve regurgitation is  not visualized. No aortic stenosis is present.   6. There is mild dilatation of the ascending aorta, measuring 38 mm.   7. The inferior vena cava is normal in size with greater than 50%  respiratory variability, suggesting right atrial pressure of 3 mmHg.   Cardiac monitor 09/02/2020 personally reviewed Atrial Fibrillation occurred continuously (100% burden), ranging from 39-151 bpm (avg of 65 bpm).   Premature ventricular complexes were frequent (5.2%, [id number]). Ventricular Bigeminy and Trigeminy were present.   18 patient triggered events in 26 diary events were associated with the atrial fibrillation and premature ventricular complex.  RHC/LHC 07/18/20 Ost Cx lesion is 25% stenosed. Mid LAD lesion is 25% stenosed. The left ventricular systolic function is normal. LV end diastolic pressure is mildly elevated. The left ventricular ejection fraction is 55-65% by visual estimate. There is no aortic valve stenosis. Ao sat 99%, PA sat 73%, PA pressure 32/12, mean PA 25 mm Hg; mean PCWP 20 mm Hg; CO 5.1 L/min; CI 2.38  ASSESSMENT AND PLAN:  1.  Persistent atrial fibrillation: Recent cardiac monitor with a 100% atrial fibrillation burden.  She had an attempt at cardioversion but did not convert to sinus rhythm.  She was loaded on dofetilide, but again did not convert.  She has now been on amiodarone for 1 month.  High risk medication monitoring.  She was initially planned for ablation, but has potential surgeries upcoming.  Due to that, we Melissa Boyer cancel ablation and plan for cardioversion.  2.  Obstructive sleep apnea: CPAP compliance encouraged  3.  Hypertension: Currently well controlled  4.  Obesity: Diet and exercise encouraged  Current medicines are reviewed at length with the patient today.   The patient does not have concerns regarding her medicines.  The following changes were made today:  none  Labs/ tests ordered  today include:  Orders Placed This Encounter  Procedures   Basic Metabolic Panel (BMET)   CBC   EKG 12-Lead     Disposition:   FU with Melissa Boyer 3 months  Signed, Melissa Boyer Meredith Leeds, MD  01/01/2021 4:50 PM     [address] [address] [phone] (office) [phone] (fax)

## 2021-01-01 NOTE — Patient Instructions (Signed)
Medication Instructions:  Your physician recommends that you continue on your current medications as directed. Please refer to the Current Medication list given to you today.  *If you need a refill on your cardiac medications before your next appointment, please call your pharmacy*   Lab Work: TODAY - BMET, CBC If you have labs (blood work) drawn today and your tests are completely normal, you will receive your results only by: [place] (if you have MyChart) OR A paper copy in the mail If you have any lab test that is abnormal or we need to change your treatment, we will call you to review the results.   Testing/Procedures:  Your A Fib ablation on 9/30 has been cancelled.  You are scheduled for a Cardioversion on Wednesday Sept. 14 with Dr. Curt Bears.  Please arrive at the [facility] (Main Entrance A) at [facility]: [address] [place], [place] [address] at 1230 pm.   DIET: Nothing to eat or drink after midnight except a sip of water with medications (see medication instructions below)  FYI: For your safety, and to allow [place] to monitor your vital signs accurately during the surgery/procedure we request that   if you have artificial nails, gel coating, SNS etc. Please have those removed prior to your surgery/procedure. Not having the nail coverings /polish removed may result in cancellation or delay of your surgery/procedure.   Medication Instructions: Hold your HCTZ (Hydrochlorothiazide) and Metformin on the day of the procedure  Continue your anticoagulant: Eliquis You will need to continue your anticoagulant after your procedure until you  are told by your provider that it is safe to stop   Bring your insurance cards.  *Special Note: Every effort is made to have your procedure done on time. Occasionally there are emergencies that occur at the hospital that may cause delays. Please be patient if a delay does occur.    Follow-Up: At [facility], you and your  health needs are our priority.  As part of our continuing mission to provide you with exceptional heart care, we have created designated Provider Care Teams.  These Care Teams include your primary Cardiologist (physician) and Advanced Practice Providers (APPs -  Physician Assistants and Nurse Practitioners) who all work together to provide you with the care you need, when you need it.   Your next appointment:   3 month(s) on December 22   The format for your next appointment:   In Person  Provider:   You may see Allegra Lai, MD or one of the following Advanced Practice Providers on your designated Care Team:   Tommye Standard, [place] [facility] [place], [place]

## 2021-01-01 NOTE — H&P (View-Only) (Signed)
Electrophysiology Office Note   Date:  01/01/2021   ID:  Melissa Boyer, DOB 1955-12-25, MRN [id number]  PCP:  Melissa Nakai, MD  Cardiologist:  [place] Primary Electrophysiologist:  Abad Manard Meredith Leeds, MD    Chief Complaint: AF   History of Present Illness: Melissa Boyer is a 65 y.o. female who is being seen today for the evaluation of AF at the request of Melissa Nakai, MD. Presenting today for electrophysiology evaluation.  She has a history significant for diabetes, hyperlipidemia, atrial fibrillation, sleep apnea, former tobacco abuse, nonobstructive coronary artery disease.  She had a cardiac monitor that showed a 100% atrial fibrillation burden.  She was admitted to the hospital for dofetilide, but not convert with cardioversion.  She was since loaded on amiodarone.  She initially had plans for ablation on 01/30/2021, though she has been having issues with ventral hernia and kidney lesions.  As she potentially has upcoming surgery, Melissa Boyer  Today, denies symptoms of palpitations, chest pain, shortness of breath, orthopnea, PND, lower extremity edema, claudication, dizziness, presyncope, syncope, bleeding, or neurologic sequela. The patient is tolerating medications without difficulties.  She continues to feel weak and fatigued with shortness of breath.  She has felt better over the last few weeks since being in the hospital.  She would like to get back into normal rhythm.   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    Hypercholesteremia    Hypertension    Left renal mass 12/29/2019   Mixed hyperlipidemia 02/27/2019   Obesity (BMI 35.0-39.9 without comorbidity) 12/29/2019   Obstructive sleep apnea on CPAP 06/19/2019   Paroxysmal atrial fibrillation ([place]) 02/27/2019   Pure hypercholesterolemia 01/01/2014   Thyroid disease    hypothyroid   Unspecified hypothyroidism 01/01/2014   Past Surgical History:  Procedure Laterality Date   BUBBLE STUDY  10/16/2020   Procedure: BUBBLE STUDY;  Surgeon: Elouise Munroe, MD;  Location: [place];  Service: Cardiovascular;;   CARDIOVERSION N/A 10/16/2020   Procedure: CARDIOVERSION;  Surgeon: Elouise Munroe, MD;  Location: [place];  Service: Cardiovascular;  Laterality: N/A;   CARDIOVERSION N/A 12/04/2020   Procedure: CARDIOVERSION;  Surgeon: Buford Dresser, MD;  Location: [facility];  Service: Cardiovascular;  Laterality: N/A;   COLPOSCOPY  10/98   normal, no biopsy   ENDOMETRIAL ABLATION  12/02/2002   ENDOMETRIAL BIOPSY  5/03   IR RADIOLOGIST EVAL & MGMT  11/05/2020   RIGHT/LEFT HEART CATH AND CORONARY ANGIOGRAPHY N/A 07/18/2020   Procedure: RIGHT/LEFT HEART CATH AND CORONARY ANGIOGRAPHY;  Surgeon: Jettie Booze, MD;  Location: [place] CV LAB;  Service: Cardiovascular;  Laterality: N/A;   TEE WITHOUT CARDIOVERSION N/A 10/16/2020   Procedure: TRANSESOPHAGEAL ECHOCARDIOGRAM (TEE);  Surgeon: Elouise Munroe, MD;  Location: [facility] ENDOSCOPY;  Service: Cardiovascular;  Laterality: N/A;   THYROIDECTOMY, PARTIAL     thyroid nodule excision left side     Current Outpatient Medications  Medication Sig Dispense Refill  acetaminophen (TYLENOL) 650 MG CR tablet Take 1,300 mg by mouth every 8 (eight) hours as needed for pain.     ALPRAZolam (XANAX) 0.5 MG tablet Take 0.25 mg by mouth daily as needed for anxiety.     amiodarone (PACERONE) 200 MG tablet Take 1 tablet (200 mg total) by mouth daily. 30 tablet 6   amLODipine (NORVASC) 5 MG tablet Take 2.5  mg by mouth at bedtime.     apixaban (ELIQUIS) 5 MG TABS tablet Take 1 tablet (5 mg total) by mouth 2 (two) times daily. 60 tablet 13   atorvastatin (LIPITOR) 20 MG tablet Take 20 mg by mouth every evening.     gabapentin (NEURONTIN) 100 MG capsule Take 100 mg by mouth daily.     levothyroxine (SYNTHROID, LEVOTHROID) 125 MCG tablet Take 125 mcg by mouth daily before breakfast.     loratadine (CLARITIN) 10 MG tablet Take 10 mg by mouth daily as needed for allergies.     losartan (COZAAR) 100 MG tablet Take 100 mg by mouth daily.     meloxicam (MOBIC) 7.5 MG tablet Take 3.75-7.5 mg by mouth daily as needed for pain.     metFORMIN (GLUCOPHAGE) 500 MG tablet Take 1 tablet (500 mg total) by mouth 2 (two) times daily with a meal.     metoprolol succinate (TOPROL-XL) 50 MG 24 hr tablet Take 1 tablet (50 mg total) by mouth daily. Take with or immediately following a meal. 30 tablet 6   nitroGLYCERIN (NITROSTAT) 0.4 MG SL tablet Place 1 tablet (0.4 mg total) under the tongue every 5 (five) minutes as needed for chest pain. 90 tablet 3   triazolam (HALCION) 0.25 MG tablet Take 0.5 mg by mouth at bedtime as needed for sleep.     hydrochlorothiazide (HYDRODIURIL) 12.5 MG tablet Take 12.5 mg by mouth daily. (Patient not taking: Reported on 01/01/2021)     No current facility-administered medications for this visit.    Allergies:   Patient has no known allergies.   Social History:  The patient  reports that she quit smoking about 17 years ago. Her smoking use included cigarettes. She has a 30.00 pack-year smoking history. She has never used smokeless tobacco. She reports that she does not drink alcohol and does not use drugs.   Family History:  The patient's family history includes Cancer in her maternal aunt, mother, and sister; Diabetes in her maternal grandfather; Emphysema in her father; Heart attack in her maternal aunt and maternal grandmother; Hypertension in her father.   ROS:  Please see the history  of present illness.   Otherwise, review of systems is positive for none.   All other systems are reviewed and negative.   PHYSICAL EXAM: VS:  BP 130/72   Pulse 62   Ht '5\' 7"'$  (1.702 m)   Wt 233 lb 6.4 oz (105.9 kg)   LMP 12/02/2002 (Approximate)   SpO2 98%   BMI 36.56 kg/m  , BMI Body mass index is 36.56 kg/m. GEN: Well nourished, well developed, in no acute distress  HEENT: normal  Neck: no JVD, carotid bruits, or masses Cardiac: irregularRRR; no murmurs, rubs, or gallops,no edema  Respiratory:  clear to auscultation bilaterally, normal work of breathing GI: soft, nontender, nondistended, + BS MS: no deformity or atrophy  Skin: warm and dry Neuro:  Strength and sensation are intact Psych: euthymic mood, full affect  EKG:  EKG is ordered today. Personal review of the ekg ordered shows atrial fibrillation  Recent Labs:  10/14/2020: Hemoglobin 15.6; Platelets 249 12/03/2020: Magnesium 1.9 12/04/2020: BUN 19; Creatinine, Ser 0.89; Potassium 3.9; Sodium 136    Lipid Panel  No results found for: CHOL, TRIG, HDL, CHOLHDL, VLDL, LDLCALC, LDLDIRECT   Wt Readings from Last 3 Encounters:  01/01/21 233 lb 6.4 oz (105.9 kg)  12/19/20 235 lb (106.6 kg)  12/01/20 235 lb 1.6 oz (106.6 kg)      Other studies Reviewed: Additional studies/ records that were reviewed today include: TTE 07/28/20  Review of the above records today demonstrates:   1. Left ventricular ejection fraction, by estimation, is 60 to 65%. The  left ventricle has normal function. The left ventricle has no regional  wall motion abnormalities. Left ventricular diastolic parameters are  indeterminate.   2. Right ventricular systolic function is normal. The right ventricular  size is normal. There is normal pulmonary artery systolic pressure.   3. Left atrial size was mild to moderately dilated.   4. The mitral valve is normal in structure. No evidence of mitral valve  regurgitation. No evidence of mitral stenosis.   5.  The aortic valve is normal in structure. Aortic valve regurgitation is  not visualized. No aortic stenosis is present.   6. There is mild dilatation of the ascending aorta, measuring 38 mm.   7. The inferior vena cava is normal in size with greater than 50%  respiratory variability, suggesting right atrial pressure of 3 mmHg.   Cardiac monitor 09/02/2020 personally reviewed Atrial Fibrillation occurred continuously (100% burden), ranging from 39-151 bpm (avg of 65 bpm).   Premature ventricular complexes were frequent (5.2%, [id number]). Ventricular Bigeminy and Trigeminy were present.   18 patient triggered events in 26 diary events were associated with the atrial fibrillation and premature ventricular complex.  RHC/LHC 07/18/20 Ost Cx lesion is 25% stenosed. Mid LAD lesion is 25% stenosed. The left ventricular systolic function is normal. LV end diastolic pressure is mildly elevated. The left ventricular ejection fraction is 55-65% by visual estimate. There is no aortic valve stenosis. Ao sat 99%, PA sat 73%, PA pressure 32/12, mean PA 25 mm Hg; mean PCWP 20 mm Hg; CO 5.1 L/min; CI 2.38  ASSESSMENT AND PLAN:  1.  Persistent atrial fibrillation: Recent cardiac monitor with a 100% atrial fibrillation burden.  She had an attempt at cardioversion but did not convert to sinus rhythm.  She was loaded on dofetilide, but again did not convert.  She has now been on amiodarone for 1 month.  High risk medication monitoring.  She was initially planned for ablation, but has potential surgeries upcoming.  Due to that, we Andrus Sharp cancel ablation and plan for cardioversion.  2.  Obstructive sleep apnea: CPAP compliance encouraged  3.  Hypertension: Currently well controlled  4.  Obesity: Diet and exercise encouraged  Current medicines are reviewed at length with the patient today.   The patient does not have concerns regarding her medicines.  The following changes were made today:  none  Labs/ tests ordered  today include:  Orders Placed This Encounter  Procedures   Basic Metabolic Panel (BMET)   CBC   EKG 12-Lead     Disposition:   FU with Nasier Thumm 3 months  Signed, Isobel Eisenhuth Meredith Leeds, MD  01/01/2021 4:50 PM     [address] [place] [address] [phone] (office) [phone] (fax)

## 2021-01-02 LAB — CBC
Hematocrit: 44.2 % (ref 34.0–46.6)
Hemoglobin: 14.9 g/dL (ref 11.1–15.9)
MCH: 30.2 pg (ref 26.6–33.0)
MCHC: 33.7 g/dL (ref 31.5–35.7)
MCV: 90 fL (ref 79–97)
Platelets: 260 10*3/uL (ref 150–450)
RBC: 4.93 x10E6/uL (ref 3.77–5.28)
RDW: 13 % (ref 11.7–15.4)
WBC: 7.6 10*3/uL (ref 3.4–10.8)

## 2021-01-02 LAB — BASIC METABOLIC PANEL
BUN/Creatinine Ratio: 18 (ref 12–28)
BUN: 17 mg/dL (ref 8–27)
CO2: 16 mmol/L — ABNORMAL LOW (ref 20–29)
Calcium: 9.7 mg/dL (ref 8.7–10.3)
Chloride: 104 mmol/L (ref 96–106)
Creatinine, Ser: 0.97 mg/dL (ref 0.57–1.00)
Glucose: 100 mg/dL — ABNORMAL HIGH (ref 65–99)
Potassium: 4.4 mmol/L (ref 3.5–5.2)
Sodium: 138 mmol/L (ref 134–144)
eGFR: 65 mL/min/{1.73_m2} (ref >59)

## 2021-01-13 ENCOUNTER — Telehealth: Payer: Self-pay | Admitting: Cardiology

## 2021-01-13 NOTE — Telephone Encounter (Signed)
Patient states she has cardioversion scheduled for tomorrow with Dr. Curt Bears, but has not received any calls about it. She also says she called the cath lab but they did not see anything scheduled.

## 2021-01-13 NOTE — Telephone Encounter (Signed)
Reviewed instructions with patient and sent via mychart per request. Pt appreciates my help

## 2021-01-14 ENCOUNTER — Ambulatory Visit (HOSPITAL_COMMUNITY)
Admission: RE | Admit: 2021-01-14 | Discharge: 2021-01-14 | Disposition: A | Discharge status: Home / Self Care | Payer: 59 | Attending: Cardiology | Admitting: Cardiology

## 2021-01-14 ENCOUNTER — Ambulatory Visit (HOSPITAL_COMMUNITY): Payer: 59 | Admitting: Anesthesiology

## 2021-01-14 ENCOUNTER — Encounter (HOSPITAL_COMMUNITY)
Admission: RE | Disposition: A | Discharge status: Home / Self Care | Payer: Self-pay | Source: Home / Self Care | Attending: Cardiology

## 2021-01-14 ENCOUNTER — Other Ambulatory Visit: Payer: Self-pay

## 2021-01-14 DIAGNOSIS — Z79899 Other long term (current) drug therapy: Secondary | ICD-10-CM | POA: Diagnosis not present

## 2021-01-14 DIAGNOSIS — Z6836 Body mass index (BMI) 36.0-36.9, adult: Secondary | ICD-10-CM | POA: Insufficient documentation

## 2021-01-14 DIAGNOSIS — Z87891 Personal history of nicotine dependence: Secondary | ICD-10-CM | POA: Insufficient documentation

## 2021-01-14 DIAGNOSIS — Z791 Long term (current) use of non-steroidal anti-inflammatories (NSAID): Secondary | ICD-10-CM | POA: Diagnosis not present

## 2021-01-14 DIAGNOSIS — Z79890 Hormone replacement therapy (postmenopausal): Secondary | ICD-10-CM | POA: Diagnosis not present

## 2021-01-14 DIAGNOSIS — E119 Type 2 diabetes mellitus without complications: Secondary | ICD-10-CM | POA: Diagnosis not present

## 2021-01-14 DIAGNOSIS — E782 Mixed hyperlipidemia: Secondary | ICD-10-CM | POA: Diagnosis not present

## 2021-01-14 DIAGNOSIS — Z8249 Family history of ischemic heart disease and other diseases of the circulatory system: Secondary | ICD-10-CM | POA: Diagnosis not present

## 2021-01-14 DIAGNOSIS — G4733 Obstructive sleep apnea (adult) (pediatric): Secondary | ICD-10-CM | POA: Insufficient documentation

## 2021-01-14 DIAGNOSIS — I10 Essential (primary) hypertension: Secondary | ICD-10-CM | POA: Diagnosis not present

## 2021-01-14 DIAGNOSIS — E669 Obesity, unspecified: Secondary | ICD-10-CM | POA: Insufficient documentation

## 2021-01-14 DIAGNOSIS — Z7984 Long term (current) use of oral hypoglycemic drugs: Secondary | ICD-10-CM | POA: Diagnosis not present

## 2021-01-14 DIAGNOSIS — Z7901 Long term (current) use of anticoagulants: Secondary | ICD-10-CM | POA: Insufficient documentation

## 2021-01-14 DIAGNOSIS — I2510 Atherosclerotic heart disease of native coronary artery without angina pectoris: Secondary | ICD-10-CM | POA: Insufficient documentation

## 2021-01-14 DIAGNOSIS — Z833 Family history of diabetes mellitus: Secondary | ICD-10-CM | POA: Diagnosis not present

## 2021-01-14 DIAGNOSIS — I4819 Other persistent atrial fibrillation: Secondary | ICD-10-CM | POA: Insufficient documentation

## 2021-01-14 DIAGNOSIS — I4891 Unspecified atrial fibrillation: Secondary | ICD-10-CM | POA: Diagnosis not present

## 2021-01-14 HISTORY — PX: CARDIOVERSION: EP1203

## 2021-01-14 LAB — GLUCOSE, CAPILLARY
Glucose-Capillary: 110 mg/dL — ABNORMAL HIGH (ref 70–99)
Glucose-Capillary: 94 mg/dL (ref 70–99)

## 2021-01-14 SURGERY — CARDIOVERSION (CATH LAB)
Anesthesia: General

## 2021-01-14 MED ORDER — PROPOFOL 10 MG/ML IV BOLUS
INTRAVENOUS | Status: DC | PRN
  Administered 2021-01-14: 70 mg via INTRAVENOUS
  Administered 2021-01-14: 80 mg via INTRAVENOUS

## 2021-01-14 MED ORDER — LIDOCAINE 2% (20 MG/ML) 5 ML SYRINGE
INTRAMUSCULAR | Status: DC | PRN
  Administered 2021-01-14: 80 mg via INTRAVENOUS

## 2021-01-14 SURGICAL SUPPLY — 1 items: PAD PRO RADIOLUCENT 2001M-C (PAD) ×2 IMPLANT

## 2021-01-14 NOTE — Anesthesia Procedure Notes (Signed)
Procedure Name: General with mask airway Date/Time: 01/14/2021 3:53 PM Performed by: Valda Favia, CRNA Pre-anesthesia Checklist: Patient identified, Emergency Drugs available, Suction available, Patient being monitored and Timeout performed Patient Re-evaluated:Patient Re-evaluated prior to induction Oxygen Delivery Method: Ambu bag Preoxygenation: Pre-oxygenation with 100% oxygen Induction Type: IV induction Ventilation: Mask ventilation without difficulty Dental Injury: Teeth and Oropharynx as per pre-operative assessment

## 2021-01-14 NOTE — Transfer of Care (Signed)
Immediate Anesthesia Transfer of Care Note  Patient: Melissa Boyer  Procedure(s) Performed: CARDIOVERSION (CATH LAB)  Patient Location: Cath Lab  Anesthesia Type:General  Level of Consciousness: awake, alert  and oriented  Airway & Oxygen Therapy: Patient Spontanous Breathing  Post-op Assessment: Report given to RN and Post -op Vital signs reviewed and stable  Post vital signs: Reviewed and stable  Last Vitals:  Vitals Value Taken Time  BP 128/97 01/14/21 1558  Temp    Pulse 46 01/14/21 1558  Resp 16 01/14/21 1558  SpO2 96 % 01/14/21 1558  Vitals shown include unvalidated device data.  Last Pain:  Vitals:   01/14/21 1403  TempSrc:   PainSc: 0-No pain         Complications: There were no known notable events for this encounter.

## 2021-01-14 NOTE — Progress Notes (Signed)
Pt ambulated without difficulty or bleeding.   Discharged home with friend who will drive and stay with pt x 24 hrs  

## 2021-01-14 NOTE — Anesthesia Preprocedure Evaluation (Addendum)
Anesthesia Evaluation  Patient identified by MRN, date of birth, ID band Patient awake    Reviewed: Allergy & Precautions, NPO status , Patient's Chart, lab work & pertinent test results, reviewed documented beta blocker date and time   Airway Mallampati: II  TM Distance: >3 FB Neck ROM: Full    Dental  (+) Teeth Intact, Dental Advisory Given, Caps,    Pulmonary sleep apnea and Continuous Positive Airway Pressure Ventilation , former smoker,    Pulmonary exam normal breath sounds clear to auscultation       Cardiovascular hypertension, Pt. on medications and Pt. on home beta blockers (-) angina+ CAD  (-) Past MI + dysrhythmias Atrial Fibrillation  Rhythm:Irregular Rate:Abnormal     Neuro/Psych negative neurological ROS  negative psych ROS   GI/Hepatic negative GI ROS, (+) Hepatitis -, C  Endo/Other  diabetes, Type 2, Oral Hypoglycemic AgentsHypothyroidism Obesity   Renal/GU negative Renal ROSLeft renal mass     Musculoskeletal negative musculoskeletal ROS (+)   Abdominal   Peds  Hematology  (+) Blood dyscrasia (Eliquis), ,   Anesthesia Other Findings Day of surgery medications reviewed with the patient.  Reproductive/Obstetrics                            Anesthesia Physical Anesthesia Plan  ASA: 3  Anesthesia Plan: General   Post-op Pain Management:    Induction: Intravenous  PONV Risk Score and Plan: 3 and TIVA  Airway Management Planned: Mask  Additional Equipment:   Intra-op Plan:   Post-operative Plan:   Informed Consent: I have reviewed the patients History and Physical, chart, labs and discussed the procedure including the risks, benefits and alternatives for the proposed anesthesia with the patient or authorized representative who has indicated his/her understanding and acceptance.     Dental advisory given  Plan Discussed with: CRNA  Anesthesia Plan Comments:         Anesthesia Quick Evaluation

## 2021-01-14 NOTE — Anesthesia Postprocedure Evaluation (Signed)
Anesthesia Post Note  Patient: Melissa Boyer  Procedure(s) Performed: CARDIOVERSION (CATH LAB)     Patient location during evaluation: PACU Anesthesia Type: General Level of consciousness: awake and alert Pain management: pain level controlled Vital Signs Assessment: post-procedure vital signs reviewed and stable Respiratory status: spontaneous breathing, nonlabored ventilation and respiratory function stable Cardiovascular status: stable and blood pressure returned to baseline Anesthetic complications: no   There were no known notable events for this encounter.  Last Vitals:  Vitals:   01/14/21 1605 01/14/21 1610  BP: (!) 143/61 124/67  Pulse: (!) 56 (!) 52  Resp: 20 17  Temp:    SpO2: 96% 95%    Last Pain:  Vitals:   01/14/21 1559  TempSrc: Temporal  PainSc: 0-No pain                 Audry Pili

## 2021-01-14 NOTE — Interval H&P Note (Signed)
History and Physical Interval Note:  01/14/2021 3:05 PM  Melissa Boyer  has presented today for surgery, with the diagnosis of afib.  The various methods of treatment have been discussed with the patient and family. After consideration of risks, benefits and other options for treatment, the patient has consented to  Procedure(s): CARDIOVERSION (CATH LAB) (N/A) as a surgical intervention.  The patient's history has been reviewed, patient examined, no change in status, stable for surgery.  I have reviewed the patient's chart and labs.  Questions were answered to the patient's satisfaction.     Leonarda Leis [employer]

## 2021-01-15 ENCOUNTER — Encounter (HOSPITAL_COMMUNITY): Payer: Self-pay | Admitting: Cardiology

## 2021-01-30 ENCOUNTER — Ambulatory Visit (HOSPITAL_COMMUNITY): Admit: 2021-01-30 | Payer: 59 | Admitting: Cardiology

## 2021-01-30 ENCOUNTER — Encounter (HOSPITAL_COMMUNITY): Payer: Self-pay

## 2021-01-30 SURGERY — ATRIAL FIBRILLATION ABLATION
Anesthesia: General

## 2021-03-31 ENCOUNTER — Telehealth: Payer: Self-pay | Admitting: Oncology

## 2021-03-31 NOTE — Telephone Encounter (Signed)
03/31/21 spoke with patient and cancelled all appts-recovering from recent surgery

## 2021-04-09 ENCOUNTER — Other Ambulatory Visit: Payer: Medicare Other

## 2021-04-10 ENCOUNTER — Ambulatory Visit: Payer: Medicare Other | Admitting: Oncology

## 2021-04-22 NOTE — Progress Notes (Signed)
Electrophysiology Office Note   Date:  04/23/2021   ID:  Melissa Boyer, DOB October 26, 1955, MRN [id number]  PCP:  Cher Nakai, MD  Cardiologist:  [facility] Primary Electrophysiologist:  Nevah Dalal Meredith Leeds, MD    Chief Complaint: AF   History of Present Illness: Melissa Boyer is a 65 y.o. female who is being seen today for the evaluation of AF at the request of Cher Nakai, MD. Presenting today for electrophysiology evaluation.  She has a history significant for diabetes, hyperlipidemia, atrial fibrillation, sleep apnea, former tobacco abuse, nonobstructive coronary artery disease.  She wore a cardiac monitor that showed a 1% atrial fibrillation burden.  She was initially admitted to the hospital for dofetilide load but did not convert to sinus rhythm with cardioversion.  She has been on amiodarone.  She had initially plans for ablation 01/30/2021 but has been having issues with ventral hernia and kidney lesions.  She is status post cardioversion 01/14/2021.  Today, denies symptoms of palpitations, chest pain, shortness of breath, orthopnea, PND, lower extremity edema, claudication, dizziness, presyncope, syncope, bleeding, or neurologic sequela. The patient is tolerating medications without difficulties.  Since being seen she has done well.  She has some mild fatigue, but overall has no major complaints.  She is in atrial fibrillation today which is a surprise to her.  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   Hypercholesteremia    Hypertension    Left renal mass 12/29/2019   Mixed hyperlipidemia 02/27/2019   Obesity (BMI 35.0-39.9 without comorbidity) 12/29/2019   Obstructive sleep apnea on CPAP 06/19/2019   Paroxysmal atrial fibrillation ([place]) 02/27/2019   Pure hypercholesterolemia 01/01/2014   Thyroid disease    hypothyroid   Unspecified hypothyroidism 01/01/2014   Past Surgical History:  Procedure Laterality Date   BUBBLE STUDY  10/16/2020   Procedure: BUBBLE STUDY;  Surgeon: Elouise Munroe, MD;  Location: [facility];  Service: Cardiovascular;;   CARDIOVERSION N/A 10/16/2020   Procedure: CARDIOVERSION;  Surgeon: Elouise Munroe, MD;  Location: [facility] ENDOSCOPY;  Service: Cardiovascular;  Laterality: N/A;   CARDIOVERSION N/A 12/04/2020   Procedure: CARDIOVERSION;  Surgeon: Buford Dresser, MD;  Location: [facility];  Service: Cardiovascular;  Laterality: N/A;   CARDIOVERSION N/A 01/14/2021   Procedure: CARDIOVERSION (CATH LAB);  Surgeon: Constance Haw, MD;  Location: [facility] CV LAB;  Service: Cardiovascular;  Laterality: N/A;   COLPOSCOPY  10/98   normal, no biopsy   ENDOMETRIAL ABLATION  12/02/2002   ENDOMETRIAL BIOPSY  5/03   IR RADIOLOGIST EVAL & MGMT  11/05/2020   RIGHT/LEFT HEART CATH AND CORONARY ANGIOGRAPHY N/A 07/18/2020   Procedure: RIGHT/LEFT HEART CATH AND CORONARY ANGIOGRAPHY;  Surgeon: Jettie Booze, MD;  Location: [facility] CV LAB;  Service: Cardiovascular;  Laterality: N/A;   TEE WITHOUT CARDIOVERSION N/A 10/16/2020   Procedure: TRANSESOPHAGEAL  ECHOCARDIOGRAM (TEE);  Surgeon: Elouise Munroe, MD;  Location: [facility] ENDOSCOPY;  Service: Cardiovascular;  Laterality: N/A;   THYROIDECTOMY, PARTIAL     thyroid nodule excision left side     Current Outpatient Medications  Medication Sig Dispense Refill   acetaminophen (TYLENOL) 650 MG CR tablet Take 1,300 mg by mouth every 8  (eight) hours as needed for pain.     ALPRAZolam (XANAX) 0.5 MG tablet Take 0.25 mg by mouth daily as needed for anxiety.     amiodarone (PACERONE) 200 MG tablet Take 1 tablet (200 mg total) by mouth daily. 30 tablet 6   amLODipine (NORVASC) 5 MG tablet Take 2.5 mg by mouth at bedtime.     apixaban (ELIQUIS) 5 MG TABS tablet Take 1 tablet (5 mg total) by mouth 2 (two) times daily. 60 tablet 13   atorvastatin (LIPITOR) 20 MG tablet Take 20 mg by mouth every evening.     gabapentin (NEURONTIN) 100 MG capsule Take 100 mg by mouth 2 (two) times daily.     levothyroxine (SYNTHROID, LEVOTHROID) 125 MCG tablet Take 125 mcg by mouth daily before breakfast.     losartan (COZAAR) 100 MG tablet Take 100 mg by mouth daily.     meloxicam (MOBIC) 7.5 MG tablet Take 3.75-7.5 mg by mouth daily as needed for pain.     metFORMIN (GLUCOPHAGE) 500 MG tablet Take 1 tablet (500 mg total) by mouth 2 (two) times daily with a meal. (Patient taking differently: Take 500-1,000 mg by mouth See admin instructions. Take 1000 mg in the morning and 500 mg at night)     metoprolol succinate (TOPROL-XL) 50 MG 24 hr tablet Take 1 tablet (50 mg total) by mouth daily. Take with or immediately following a meal. 30 tablet 6   triazolam (HALCION) 0.25 MG tablet Take 0.5 mg by mouth at bedtime.     nitroGLYCERIN (NITROSTAT) 0.4 MG SL tablet Place 1 tablet (0.4 mg total) under the tongue every 5 (five) minutes as needed for chest pain. 90 tablet 3   No current facility-administered medications for this visit.    Allergies:   Tape   Social History:  The patient  reports that she quit smoking about 17 years ago. Her smoking use included cigarettes. She has a 30.00 pack-year smoking history. She has never used smokeless tobacco. She reports that she does not drink alcohol and does not use drugs.   Family History:  The patient's family history includes Cancer in her maternal aunt, mother, and sister; Diabetes in her maternal grandfather;  Emphysema in her father; Heart attack in her maternal aunt and maternal grandmother; Hypertension in her father.   ROS:  Please see the history of present illness.   Otherwise, review of systems is positive for none.   All other systems are reviewed and negative.   PHYSICAL EXAM: VS:  BP (!) 140/92    Pulse (!) 56    Ht 5\' 7"  (1.702 m)    Wt 239 lb 12.8 oz (108.8 kg)    LMP 12/02/2002 (Approximate)    SpO2 94%    BMI 37.56 kg/m  , BMI Body mass index is 37.56 kg/m. GEN: Well nourished, well developed, in no acute distress  HEENT: normal  Neck: no JVD, carotid bruits, or masses Cardiac: irregular; no murmurs, rubs, or gallops,no edema  Respiratory:  clear to auscultation bilaterally, normal work of breathing GI: soft, nontender, nondistended, + BS MS: no deformity or atrophy  Skin: warm and dry Neuro:  Strength and sensation are intact Psych: euthymic mood, full affect  EKG:  EKG is ordered today. Personal review of the ekg ordered shows atrial fibrillation, rate 63  Recent Labs: 12/03/2020: Magnesium 1.9 01/01/2021: BUN 17; Creatinine, Ser 0.97; Hemoglobin 14.9; Platelets 260; Potassium 4.4; Sodium 138    Lipid Panel  No results found for: CHOL, TRIG, HDL, CHOLHDL, VLDL, LDLCALC, LDLDIRECT   Wt Readings from Last 3 Encounters:  04/23/21 239 lb 12.8 oz (108.8 kg)  01/14/21 230 lb (104.3 kg)  01/01/21 233 lb 6.4 oz (105.9 kg)      Other studies Reviewed: Additional studies/ records that were reviewed today include: TTE 07/28/20  Review of the above records today demonstrates:   1. Left ventricular ejection fraction, by estimation, is 60 to 65%. The  left ventricle has normal function. The left ventricle has no regional  wall motion abnormalities. Left ventricular diastolic parameters are  indeterminate.   2. Right ventricular systolic function is normal. The right ventricular  size is normal. There is normal pulmonary artery systolic pressure.   3. Left atrial size was mild to  moderately dilated.   4. The mitral valve is normal in structure. No evidence of mitral valve  regurgitation. No evidence of mitral stenosis.   5. The aortic valve is normal in structure. Aortic valve regurgitation is  not visualized. No aortic stenosis is present.   6. There is mild dilatation of the ascending aorta, measuring 38 mm.   7. The inferior vena cava is normal in size with greater than 50%  respiratory variability, suggesting right atrial pressure of 3 mmHg.   Cardiac monitor 09/02/2020 personally reviewed Atrial Fibrillation occurred continuously (100% burden), ranging from 39-151 bpm (avg of 65 bpm).   Premature ventricular complexes were frequent (5.2%, [id number]). Ventricular Bigeminy and Trigeminy were present.   18 patient triggered events in 26 diary events were associated with the atrial fibrillation and premature ventricular complex.  RHC/LHC 07/18/20 Ost Cx lesion is 25% stenosed. Mid LAD lesion is 25% stenosed. The left ventricular systolic function is normal. LV end diastolic pressure is mildly elevated. The left ventricular ejection fraction is 55-65% by visual estimate. There is no aortic valve stenosis. Ao sat 99%, PA sat 73%, PA pressure 32/12, mean PA 25 mm Hg; mean PCWP 20 mm Hg; CO 5.1 L/min; CI 2.38  ASSESSMENT AND PLAN:  1.  Persistent atrial fibrillation: Cardiac monitor with a 100% burden.  Had an attempted cardioversion but did not convert to sinus rhythm.  Was loaded on dofetilide but again did not convert.  Currently on amiodarone 200 mg daily, Eliquis 5 mg twice daily.  CHA2DS2-VASc of 3.  Fortunately she is back in atrial fibrillation.  She would like to get into normal rhythm.  She has some amount of fatigue but overall feels well.  She would prefer a rhythm control strategy.  Risk, benefits, and alternatives to EP study and radiofrequency ablation for afib were also discussed in detail today. These risks include but are not limited to stroke, bleeding,  vascular damage, tamponade, perforation, damage to the esophagus, lungs, and other structures, pulmonary vein stenosis, worsening renal function, and death. The patient understands these risk and wishes to proceed.  We Braidan Ricciardi therefore proceed with catheter ablation at the next available time.  Carto, ICE, anesthesia are requested for the procedure.  Breniyah Romm also obtain CT PV protocol prior to the procedure to exclude LAA thrombus and further evaluate atrial anatomy.   2.  Obstructive sleep apnea: CPAP  compliance encouraged  3.  Hypertension: Mildly elevated but usually well controlled.  No changes.  4.  Obesity: Diet and exercise encouraged.   Current medicines are reviewed at length with the patient today.   The patient does not have concerns regarding her medicines.  The following changes were made today: None  Labs/ tests ordered today include:  Orders Placed This Encounter  Procedures   CT CARDIAC MORPH/PULM VEIN W/CM&W/O CA SCORE   CBC   Basic metabolic panel   EKG [id number]     Disposition:   FU with Jackye Dever 3 months  Signed, Lourdez Mcgahan Meredith Leeds, MD  04/23/2021 9:05 AM     [facility] [address] [place] [address] [phone] (office) [phone] (fax)

## 2021-04-23 ENCOUNTER — Ambulatory Visit (INDEPENDENT_AMBULATORY_CARE_PROVIDER_SITE_OTHER): Payer: 59 | Admitting: Cardiology

## 2021-04-23 ENCOUNTER — Encounter: Payer: Self-pay | Admitting: Cardiology

## 2021-04-23 ENCOUNTER — Other Ambulatory Visit: Payer: Self-pay

## 2021-04-23 VITALS — BP 140/92 | HR 56 | Ht 67.0 in | Wt 239.8 lb

## 2021-04-23 DIAGNOSIS — I4819 Other persistent atrial fibrillation: Secondary | ICD-10-CM

## 2021-04-23 DIAGNOSIS — I2510 Atherosclerotic heart disease of native coronary artery without angina pectoris: Secondary | ICD-10-CM

## 2021-04-23 DIAGNOSIS — Z01818 Encounter for other preprocedural examination: Secondary | ICD-10-CM | POA: Diagnosis not present

## 2021-04-23 DIAGNOSIS — Z01812 Encounter for preprocedural laboratory examination: Secondary | ICD-10-CM

## 2021-04-23 NOTE — Patient Instructions (Signed)
Medication Instructions:  Your physician recommends that you continue on your current medications as directed. Please refer to the Current Medication list given to you today.  *If you need a refill on your cardiac medications before your next appointment, please call your pharmacy*   Lab Work: Pre procedure labs between 06/15/21 - 06/26/21:  BMP & CBC.   You can have this done at the [place] office.  If you have labs (blood work) drawn today and your tests are completely normal, you will receive your results only by: [employer] (if you have MyChart) OR A paper copy in the mail If you have any lab test that is abnormal or we need to change your treatment, we will call you to review the results.   Testing/Procedures: Your physician has requested that you have cardiac CT within 7 days PRIOR to your ablation. Cardiac computed tomography (CT) is a painless test that uses an x-ray machine to take clear, detailed pictures of your heart.  Please follow instruction below located under "other instructions". You will get a call from our office to schedule the date for this test.  Your physician has recommended that you have an ablation. Catheter ablation is a medical procedure used to treat some cardiac arrhythmias (irregular heartbeats). During catheter ablation, a long, thin, flexible tube is put into a blood vessel in your groin (upper thigh), or neck. This tube is called an ablation catheter. It is then guided to your heart through the blood vessel. Radio frequency waves destroy small areas of heart tissue where abnormal heartbeats may cause an arrhythmia to start. Please follow instruction below located under "other instructions".   Follow-Up: At [facility], you and your health needs are our priority.  As part of our continuing mission to provide you with exceptional heart care, we have created designated Provider Care Teams.  These Care Teams include your primary Cardiologist (physician)  and Advanced Practice Providers (APPs -  Physician Assistants and Nurse Practitioners) who all work together to provide you with the care you need, when you need it.  Your next appointment:   1 month(s) after your ablation  The format for your next appointment:   In Person  Provider:   AFib clinic   Thank you for choosing [facility]!!   Trinidad Curet, RN [phone]    Other Instructions   CT INSTRUCTIONS Your cardiac CT will be scheduled at:  [facility] [address] [place], [place] [address] [phone]  Please arrive at the [facility] main entrance of [facility] 30 minutes prior to test start time. Proceed to the [facility] Radiology Department (first floor) to check-in and test prep.   Please follow these instructions carefully (unless otherwise directed):   On the Night Before the Test: Be sure to Drink plenty of water. Do not consume any caffeinated/decaffeinated beverages or chocolate 12 hours prior to your test. Do not take any antihistamines 12 hours prior to your test.  On the Day of the Test: Drink plenty of water until 1 hour prior to the test. Do not eat any food 4 hours prior to the test. You may take your regular medications prior to the test.  HOLD Furosemide/Hydrochlorothiazide morning of the test. FEMALES- please wear underwire-free bra if available       After the Test: Drink plenty of water. After receiving IV contrast, you may experience a mild flushed feeling. This is normal. On occasion, you may experience a mild rash up  to 24 hours after the test. This is not dangerous. If this occurs, you can take Benadryl 25 mg and increase your fluid intake. If you experience trouble breathing, this can be serious. If it is severe call 911 IMMEDIATELY. If it is mild, please call our office. If you take any of these medications: Glipizide/Metformin, Avandament, Glucavance, please do not take 48 hours after completing  test unless otherwise instructed.   Once we have confirmed authorization from your insurance company, we will call you to set up a date and time for your test. Based on how quickly your insurance processes prior authorizations requests, please allow up to 4 weeks to be contacted for scheduling your Cardiac CT appointment. Be advised that routine Cardiac CT appointments could be scheduled as many as 8 weeks after your provider has ordered it.  For non-scheduling related questions, please contact the cardiac imaging nurse navigator should you have any questions/concerns: Marchia Bond, Cardiac Imaging Nurse Navigator [name], Cardiac Imaging Nurse Navigator [facility] Direct Office Dial: [phone]   For scheduling needs, including cancellations and rescheduling, please call [place], [phone].      Electrophysiology/Ablation Procedure Instructions   You are scheduled for a(n)  ablation on 07/10/2021 with Dr. Allegra Lai.   1.   Pre procedure testing-             A.  LAB WORK --- between 06/15/2021 - 06/26/2021  for your pre procedure blood work.  You do NOT need to be fasting.  You can stop by the [facility] office for this blood work.   On the day of your procedure 07/10/2021 you will go to [facility] [phone]) at 5:30 am.  Dennis Bast will go to the main entrance A [employer]) and enter where the [employer] are.  Your driver will drop you off and you will head down the hallway to ADMITTING.  You may have one support person come in to the hospital with you.  They will be asked to wait in the waiting room. It is OK to have someone drop you off and come back when you are ready to be discharged.   3.   Do not eat or drink after midnight prior to your procedure.   4.   On the morning of your procedure do NOT take any medication. Do not miss any doses of your blood thinner prior to the morning of your procedure or your procedure will  need to be rescheduled.   5.  Plan for an overnight stay but you may be discharged after your procedure, if you use your phone frequently bring your phone charger. If you are discharged after your procedure you will need someone to drive you home and be with you for 24 hours after your procedure.   6. You will follow up with the AFIB clinic 4 weeks after your procedure.  You will follow up with Dr. Curt Bears  3 months after your procedure.  These appointments will be made for you.   7. FYI: For your safety, and to allow [place] to monitor your vital signs accurately during the surgery/procedure we request that if you have artificial nails, gel coating, SNS etc. Please have those removed prior to your surgery/procedure. Not having the nail coverings /polish removed may result in cancellation or delay of your surgery/procedure.  * If you have ANY questions please call the office [phone] and ask for Luvina Poirier RN or send me a  MyChart message   * Occasionally, EP Studies and ablations can become lengthy.  Please make your family aware of this before your procedure starts.  Average time ranges from 2-8 hours for EP studies/ablations.  Your physician will call your family after the procedure with the results.                                   Cardiac Ablation Cardiac ablation is a procedure to destroy (ablate) some heart tissue that is sending bad signals. These bad signals cause problems in heart rhythm. The heart has many areas that make these signals. If there are problems in these areas, they can make the heart beat in a way that is not normal. Destroying some tissues can help make the heart rhythm normal. Tell your doctor about: Any allergies you have. All medicines you are taking. These include vitamins, herbs, eye drops, creams, and over-the-counter medicines. Any problems you or family members have had with medicines that make you fall asleep (anesthetics). Any blood disorders you have. Any  surgeries you have had. Any medical conditions you have, such as kidney failure. Whether you are pregnant or may be pregnant. What are the risks? This is a safe procedure. But problems may occur, including: Infection. Bruising and bleeding. Bleeding into the chest. Stroke or blood clots. Damage to nearby areas of your body. Allergies to medicines or dyes. The need for a pacemaker if the normal system is damaged. Failure of the procedure to treat the problem. What happens before the procedure? Medicines Ask your doctor about: Changing or stopping your normal medicines. This is important. Taking aspirin and ibuprofen. Do not take these medicines unless your doctor tells you to take them. Taking other medicines, vitamins, herbs, and supplements. General instructions Follow instructions from your doctor about what you cannot eat or drink. Plan to have someone take you home from the hospital or clinic. If you will be going home right after the procedure, plan to have someone with you for 24 hours. Ask your doctor what steps will be taken to prevent infection. What happens during the procedure?  An IV tube will be put into one of your veins. You will be given a medicine to help you relax. The skin on your neck or groin will be numbed. A cut (incision) will be made in your neck or groin. A needle will be put through your cut and into a large vein. A tube (catheter) will be put into the needle. The tube will be moved to your heart. Dye may be put through the tube. This helps your doctor see your heart. Small devices (electrodes) on the tube will send out signals. A type of energy will be used to destroy some heart tissue. The tube will be taken out. Pressure will be held on your cut. This helps stop bleeding. A bandage will be put over your cut. The exact procedure may vary among doctors and hospitals. What happens after the procedure? You will be watched until you leave the hospital  or clinic. This includes checking your heart rate, breathing rate, oxygen, and blood pressure. Your cut will be watched for bleeding. You will need to lie still for a few hours. Do not drive for 24 hours or as long as your doctor tells you. Summary Cardiac ablation is a procedure to destroy some heart tissue. This is done to treat heart rhythm problems. Tell your doctor  about any medical conditions you may have. Tell him or her about all medicines you are taking to treat them. This is a safe procedure. But problems may occur. These include infection, bruising, bleeding, and damage to nearby areas of your body. Follow what your doctor tells you about food and drink. You may also be told to change or stop some of your medicines. After the procedure, do not drive for 24 hours or as long as your doctor tells you. This information is not intended to replace advice given to you by your health care provider. Make sure you discuss any questions you have with your health care provider. Document Revised: 03/22/2019 Document Reviewed: 03/22/2019 Elsevier Patient Education  2022 [employer].

## 2021-05-25 DIAGNOSIS — Z1231 Encounter for screening mammogram for malignant neoplasm of breast: Secondary | ICD-10-CM | POA: Diagnosis not present

## 2021-06-25 ENCOUNTER — Other Ambulatory Visit: Payer: Self-pay | Admitting: Physician Assistant

## 2021-06-30 ENCOUNTER — Telehealth: Payer: Self-pay | Admitting: *Deleted

## 2021-06-30 ENCOUNTER — Telehealth (HOSPITAL_COMMUNITY): Payer: Self-pay | Admitting: *Deleted

## 2021-06-30 NOTE — Telephone Encounter (Signed)
Reaching out to patient to offer assistance regarding upcoming cardiac imaging study; pt states she wasn't aware of her CT appointment and will need to reschedule.  Informed her I will send a message to Dr. Curt Bears team in regards to rescheduling to accommodate her ablation procedure. Patient verbalized understanding.  [name] RN Navigator Cardiac Imaging [facility] [phone] office [phone] cell

## 2021-06-30 NOTE — Telephone Encounter (Signed)
Spoke to pt who has forgotten about their procedure next week and now she has plans to go out of town and cannot make procedure and would like to reschedule. Aware I will cancel things and be back in touch at later date to discuss rescheduling. Patient verbalized understanding and agreeable to plan.

## 2021-07-03 ENCOUNTER — Ambulatory Visit (HOSPITAL_COMMUNITY): Admission: RE | Admit: 2021-07-03 | Payer: Medicare Other | Source: Ambulatory Visit

## 2021-07-10 ENCOUNTER — Encounter (HOSPITAL_COMMUNITY): Payer: Self-pay

## 2021-07-10 ENCOUNTER — Ambulatory Visit (HOSPITAL_COMMUNITY): Admit: 2021-07-10 | Payer: Medicare Other | Admitting: Cardiology

## 2021-07-10 SURGERY — ATRIAL FIBRILLATION ABLATION
Anesthesia: General

## 2021-07-21 ENCOUNTER — Telehealth: Payer: Self-pay | Admitting: *Deleted

## 2021-07-21 NOTE — Telephone Encounter (Signed)
Pt would like ablation rescheduled to 10/07/21. ?Aware I will be in touch with instructions. ?Patient verbalized understanding and agreeable to plan.  ? ?

## 2021-08-07 ENCOUNTER — Other Ambulatory Visit: Payer: Self-pay | Admitting: Physician Assistant

## 2021-08-07 ENCOUNTER — Ambulatory Visit (HOSPITAL_COMMUNITY): Payer: 59 | Admitting: Physician Assistant

## 2021-08-26 ENCOUNTER — Telehealth: Payer: Self-pay | Admitting: Cardiology

## 2021-08-26 DIAGNOSIS — I4819 Other persistent atrial fibrillation: Secondary | ICD-10-CM

## 2021-08-26 NOTE — Telephone Encounter (Signed)
Returned pt call. ?Aware someone will call her to schedule CT but it may be week/two. ?Advised to call PCP to further discuss BP concerns. ?Patient verbalized understanding and agreeable to plan.  ? ?

## 2021-08-26 NOTE — Telephone Encounter (Signed)
Patient is having an ablation done on June 7th and states that she was told she would need to schedule a Chest Xray prior to procedure and no one has contacted her to schedule yet.  ? ? ?Pt c/o BP issue: STAT if pt c/o blurred vision, one-sided weakness or slurred speech ? ?1. What are your last 5 BP readings?  ?08/26/21:148/104 and 136/90 ?08/25/21: 172/114 ? ?2. Are you having any other symptoms (ex. Dizziness, headache, blurred vision, passed out)? Headache for the last two weeks on and off.  ? ?3. What is your BP issue? Elevated BP  ?

## 2021-08-26 NOTE — Addendum Note (Signed)
Addended by: Stanton Kidney on: 08/26/2021 05:15 PM ? ? Modules accepted: Orders ? ?

## 2021-08-28 ENCOUNTER — Ambulatory Visit (INDEPENDENT_AMBULATORY_CARE_PROVIDER_SITE_OTHER): Payer: Medicare Other | Admitting: Cardiology

## 2021-08-28 ENCOUNTER — Encounter: Payer: Self-pay | Admitting: Cardiology

## 2021-08-28 VITALS — BP 168/98 | HR 54 | Ht 67.0 in | Wt 244.4 lb

## 2021-08-28 DIAGNOSIS — I480 Paroxysmal atrial fibrillation: Secondary | ICD-10-CM | POA: Diagnosis not present

## 2021-08-28 DIAGNOSIS — I10 Essential (primary) hypertension: Secondary | ICD-10-CM

## 2021-08-28 DIAGNOSIS — E782 Mixed hyperlipidemia: Secondary | ICD-10-CM | POA: Diagnosis not present

## 2021-08-28 DIAGNOSIS — I2510 Atherosclerotic heart disease of native coronary artery without angina pectoris: Secondary | ICD-10-CM | POA: Diagnosis not present

## 2021-08-28 MED ORDER — APIXABAN 5 MG PO TABS: 5.0000 mg | ORAL_TABLET | Freq: Two times a day (BID) | ORAL | 1 refills | Status: DC

## 2021-08-28 MED ORDER — APIXABAN 5 MG PO TABS: 5.0000 mg | ORAL_TABLET | Freq: Two times a day (BID) | ORAL | 13 refills | Status: DC

## 2021-08-28 MED ORDER — AMLODIPINE BESYLATE 5 MG PO TABS: 5.0000 mg | ORAL_TABLET | Freq: Every day | ORAL | 0 refills | Status: DC

## 2021-08-28 NOTE — Progress Notes (Signed)
?Cardiology Office Note:   ? ?Date:  08/28/2021  ? ?ID:  Melissa Boyer, DOB Jan 24, 1956, MRN [id number] ? ?PCP:  Cher Nakai, MD  ?Cardiologist:  Jenean Lindau, MD  ? ?Referring MD: Cher Nakai, MD  ? ? ?ASSESSMENT:   ? ?1. Paroxysmal atrial fibrillation (HCC)   ?2. Essential hypertension   ?3. Mixed hyperlipidemia   ?4. Mild CAD   ? ?PLAN:   ? ?In order of problems listed above: ? ?Primary prevention stressed with patient.  Importance of compliance with diet medication stressed and she vocalized understanding. ?Paroxysmal atrial fibrillation:I discussed with the patient atrial fibrillation, disease process. Management and therapy including rate and rhythm control, anticoagulation benefits and potential risks were discussed extensively with the patient. Patient had multiple questions which were answered to patient's satisfaction.  She is planning ablation.  Electrophysiology notes were reviewed. ?Essential hypertension: Blood pressure stable and diet was emphasized.  I have added amlodipine double dose to her regimen.  She has some pedal edema from amlodipine but will watch out the status.  If she has pedal edema then we may need to get back on diuretic therapy instead of amlodipine.  At this time she is stressed out because of elevated blood pressure and hopefully this will help her. ?Mixed dyslipidemia and obesity: Diet was emphasized.  Lifestyle modification urged weight reduction stressed risks of obesity explained and she vocalized understanding and promises to do better. ?Patient will be seen in follow-up appointment in 6 months or earlier if the patient has any concerns ? ? ? ?Medication Adjustments/Labs and Tests Ordered: ?Current medicines are reviewed at length with the patient today.  Concerns regarding medicines are outlined above.  ?No orders of the defined types were placed in this encounter. ? ?No orders of the defined types were placed in this encounter. ? ? ? ?No chief complaint on file. ?   ? ?History of Present Illness:   ? ?Melissa Boyer is a 66 y.o. female.  Patient has past medical history of paroxysmal atrial fibrillation, essential hypertension, diabetes mellitus and dyslipidemia.  She has sleep apnea.  She denies any problems at this time except elevated blood pressure.  No chest pain orthopnea or PND.  She is stable on Lopressor 100 mg a day.  At the time of my evaluation, the patient is alert awake oriented and in no distress. ? ?Past Medical History:  ?Diagnosis Date  ? Abnormal weight gain 07/10/2020  ? Atrial fibrillation ([place])   ? Change in bowel habit 07/10/2020  ? DES exposure in utero   ? DM2 (diabetes mellitus, type 2) ([place])   ? Essential hypertension 01/01/2014  ? Family history of breast cancer in first degree relative 01/01/2014  ? Family history of colonic polyps 07/10/2020  ? Fibroids   ? Hepatitis C 11/99  ? 2 yr tx, "remission", told Ag neg  ? History of diabetes mellitus   ? 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 Hypercholesteremia   ? Hypertension   ? Left renal mass 12/29/2019  ? Mixed hyperlipidemia 02/27/2019  ? Obesity (BMI 35.0-39.9 without comorbidity) 12/29/2019  ? Obstructive sleep apnea on CPAP 06/19/2019  ? Paroxysmal atrial fibrillation ([place]) 02/27/2019  ? Pure hypercholesterolemia 01/01/2014  ? Thyroid disease   ? hypothyroid  ? Unspecified hypothyroidism 01/01/2014  ? ? ?Past Surgical History:  ?Procedure Laterality Date  ? BUBBLE STUDY  10/16/2020  ? Procedure: BUBBLE STUDY;  Surgeon: Elouise Munroe, MD;  Location:  [place] [facility];  Service: Cardiovascular;;  ? CARDIOVERSION N/A 10/16/2020  ? Procedure: CARDIOVERSION;  Surgeon: Elouise Munroe, MD;  Location: [place];  Service: Cardiovascular;  Laterality: N/A;  ? CARDIOVERSION N/A 12/04/2020  ? Procedure: CARDIOVERSION;  Surgeon: Buford Dresser, MD;  Location: [place];  Service: Cardiovascular;  Laterality: N/A;  ? CARDIOVERSION N/A 01/14/2021  ? Procedure: CARDIOVERSION (CATH  LAB);  Surgeon: Constance Haw, MD;  Location: [place] CV [facility];  Service: Cardiovascular;  Laterality: N/A;  ? COLPOSCOPY  10/98  ? normal, no biopsy  ? ENDOMETRIAL ABLATION  12/02/2002  ? ENDOMETRIAL BIOPSY  5/03  ? IR RADIOLOGIST EVAL & MGMT  11/05/2020  ? RIGHT/LEFT HEART CATH AND CORONARY ANGIOGRAPHY N/A 07/18/2020  ? Procedure: RIGHT/LEFT HEART CATH AND CORONARY ANGIOGRAPHY;  Surgeon: Jettie Booze, MD;  Location: [place] [facility];  Service: Cardiovascular;  Laterality: N/A;  ? TEE WITHOUT CARDIOVERSION N/A 10/16/2020  ? Procedure: TRANSESOPHAGEAL ECHOCARDIOGRAM (TEE);  Surgeon: Elouise Munroe, MD;  Location: [facility];  Service: Cardiovascular;  Laterality: N/A;  ? THYROIDECTOMY, PARTIAL    ? thyroid nodule excision left side  ? ? ?Current Medications: ?No outpatient medications have been marked as taking for the 08/28/21 encounter (Office Visit) with Olaoluwa Grieder, Reita Cliche, MD.  ?  ? ?Allergies:   Tape  ? ?Social History  ? ?Socioeconomic History  ? Marital status: Divorced  ?  Spouse name: Not on file  ? Number of children: 1  ? Years of education: Not on file  ? Highest education level: Not on file  ?Occupational History  ? Not on file  ?Tobacco Use  ? Smoking status: Former  ?  Packs/day: 1.00  ?  Years: 30.00  ?  Pack years: 30.00  ?  Types: Cigarettes  ?  Quit date: 07/12/2003  ?  Years since quitting: 18.1  ? Smokeless tobacco: Never  ?Vaping Use  ? Vaping Use: Never used  ?Substance and Sexual Activity  ? Alcohol use: Never  ? Drug use: No  ? Sexual activity: Yes  ?  Partners: Male  ?  Birth control/protection: Post-menopausal  ?Other Topics Concern  ? Not on file  ?Social History Narrative  ? Not on file  ? ?Social Determinants of Health  ? ?Financial Resource Strain: Not on file  ?Food Insecurity: Not on file  ?Transportation Needs: Not on file  ?Physical Activity: Not on file  ?Stress: Not on file  ?Social Connections: Not on file  ?  ? ?Family History: ?The patient's family history  includes Cancer in her maternal aunt, mother, and sister; Diabetes in her maternal grandfather; Emphysema in her father; Heart attack in her maternal aunt and maternal grandmother; Hypertension in her father. ? ?ROS:   ?Please see the history of present illness.    ?All other systems reviewed and are negative. ? ?EKGs/Labs/Other Studies Reviewed:   ? ?The following studies were reviewed today: ?I discussed my findings with the patient at length. ? ? ?Recent Labs: ?12/03/2020: Magnesium 1.9 ?01/01/2021: BUN 17; Creatinine, Ser 0.97; Hemoglobin 14.9; Platelets 260; Potassium 4.4; Sodium 138  ?Recent Lipid Panel ?No results found for: CHOL, TRIG, HDL, CHOLHDL, VLDL, LDLCALC, LDLDIRECT ? ?Physical Exam:   ? ?VS:  BP (!) 168/98 (BP Location: Left Arm, Patient Position: Sitting, Cuff Size: Large)   Pulse (!) 54   Ht '5\' 7"'$  (1.702 m)   Wt 244 lb 6.4 oz (110.9 kg)   LMP 12/02/2002 (Approximate)   BMI 38.28 kg/m?    ? ?  Wt Readings from Last 3 Encounters:  ?08/28/21 244 lb 6.4 oz (110.9 kg)  ?04/23/21 239 lb 12.8 oz (108.8 kg)  ?01/14/21 230 lb (104.3 kg)  ?  ? ?GEN: Patient is in no acute distress ?HEENT: Normal ?NECK: No JVD; No carotid bruits ?LYMPHATICS: No lymphadenopathy ?CARDIAC: Hear sounds regular, 2/6 systolic murmur at the apex. ?RESPIRATORY:  Clear to auscultation without rales, wheezing or rhonchi  ?ABDOMEN: Soft, non-tender, non-distended ?MUSCULOSKELETAL:  No edema; No deformity  ?SKIN: Warm and dry ?NEUROLOGIC:  Alert and oriented x 3 ?PSYCHIATRIC:  Normal affect  ? ?Signed, ?Jenean Lindau, MD  ?08/28/2021 11:19 AM    ?[place]  ?

## 2021-08-28 NOTE — Patient Instructions (Signed)
Medication Instructions:  ?Your physician has recommended you make the following change in your medication: Increasing amlodipine to '5mg'$  daily refilled Eliquis ? ?*If you need a refill on your cardiac medications before your next appointment, please call your pharmacy* ? ? ?Lab Work: ?None ?If you have labs (blood work) drawn today and your tests are completely normal, you will receive your results only by: ?MyChart Message (if you have MyChart) OR ?A paper copy in the mail ?If you have any lab test that is abnormal or we need to change your treatment, we will call you to review the results. ? ? ?Testing/Procedures: ?None ? ? ?Follow-Up: ?At [facility], you and your health needs are our priority.  As part of our continuing mission to provide you with exceptional heart care, we have created designated Provider Care Teams.  These Care Teams include your primary Cardiologist (physician) and Advanced Practice Providers (APPs -  Physician Assistants and Nurse Practitioners) who all work together to provide you with the care you need, when you need it. ? ?We recommend signing up for the patient portal called "MyChart".  Sign up information is provided on this After Visit Summary.  MyChart is used to connect with patients for Virtual Visits (Telemedicine).  Patients are able to view lab/test results, encounter notes, upcoming appointments, etc.  Non-urgent messages can be sent to your provider as well.   ?To learn more about what you can do with MyChart, go to [email or web address].   ? ?Your next appointment:   ?6 month(s) ? ?The format for your next appointment:   ?In Person ? ?Provider:   ?Jyl Heinz, MD  ? ? ?Other Instructions ? ? ?Important Information About [place] ? ? ? ? ?  ?

## 2021-08-31 ENCOUNTER — Ambulatory Visit: Payer: Medicare Other | Admitting: Cardiology

## 2021-09-25 ENCOUNTER — Telehealth: Payer: Self-pay

## 2021-09-25 ENCOUNTER — Other Ambulatory Visit: Payer: Self-pay

## 2021-09-25 DIAGNOSIS — I480 Paroxysmal atrial fibrillation: Secondary | ICD-10-CM

## 2021-09-25 MED ORDER — HYDROCHLOROTHIAZIDE 25 MG PO TABS: 12.5000 mg | ORAL_TABLET | Freq: Every day | ORAL | 3 refills | Status: DC

## 2021-09-25 NOTE — Telephone Encounter (Signed)
Spoke with pt, went over ablation instructions. She is having labs done in [place] on 09/29/21. She is aware of procedure date/time. 10/07/21.

## 2021-09-30 LAB — BASIC METABOLIC PANEL
BUN/Creatinine Ratio: 23 (ref 12–28)
BUN: 22 mg/dL (ref 8–27)
CO2: 24 mmol/L (ref 20–29)
Calcium: 9.4 mg/dL (ref 8.7–10.3)
Chloride: 98 mmol/L (ref 96–106)
Creatinine, Ser: 0.95 mg/dL (ref 0.57–1.00)
Glucose: 98 mg/dL (ref 70–99)
Potassium: 3.5 mmol/L (ref 3.5–5.2)
Sodium: 138 mmol/L (ref 134–144)
eGFR: 66 mL/min/{1.73_m2} (ref >59)

## 2021-09-30 LAB — CBC
Hematocrit: 40.6 % (ref 34.0–46.6)
Hemoglobin: 13.8 g/dL (ref 11.1–15.9)
MCH: 31.1 pg (ref 26.6–33.0)
MCHC: 34 g/dL (ref 31.5–35.7)
MCV: 91 fL (ref 79–97)
Platelets: 231 10*3/uL (ref 150–450)
RBC: 4.44 x10E6/uL (ref 3.77–5.28)
RDW: 12.7 % (ref 11.7–15.4)
WBC: 8.5 10*3/uL (ref 3.4–10.8)

## 2021-10-02 ENCOUNTER — Telehealth (HOSPITAL_COMMUNITY): Payer: Self-pay | Admitting: Emergency Medicine

## 2021-10-02 NOTE — Telephone Encounter (Signed)
Reaching out to patient to offer assistance regarding upcoming cardiac imaging study; pt verbalizes understanding of appt date/time, parking situation and where to check in, pre-test NPO status and medications ordered, and verified current allergies; name and call back number provided for further questions should they arise Melissa Bond RN Navigator Cardiac Imaging [facility] [phone] office [phone] cell  Difficult iv  Arrival 100 Taking daily meds, hold hctz

## 2021-10-05 ENCOUNTER — Ambulatory Visit (HOSPITAL_COMMUNITY)
Admission: RE | Admit: 2021-10-05 | Discharge: 2021-10-05 | Disposition: A | Discharge status: Home / Self Care | Payer: Medicare Other | Source: Ambulatory Visit | Attending: Cardiology | Admitting: Cardiology

## 2021-10-05 DIAGNOSIS — I4819 Other persistent atrial fibrillation: Secondary | ICD-10-CM | POA: Diagnosis present

## 2021-10-05 MED ORDER — IOHEXOL 350 MG/ML SOLN
95.0000 mL | Freq: Once | INTRAVENOUS | Status: AC | PRN
  Administered 2021-10-05: 95 mL via INTRAVENOUS

## 2021-10-06 NOTE — Pre-Procedure Instructions (Signed)
Attempted to call patient regarding procedure instructions .  Left voice mail on the following items: Arrival time 0930 Nothing to eat or drink after midnight No meds AM of procedure Responsible person to drive you home and stay with you for 24 hrs  Have you missed any doses of anti-coagulant Eliquis- take both doses today, don't take any in the morning.

## 2021-10-07 ENCOUNTER — Ambulatory Visit (HOSPITAL_BASED_OUTPATIENT_CLINIC_OR_DEPARTMENT_OTHER): Payer: Medicare Other | Admitting: Certified Registered Nurse Anesthetist

## 2021-10-07 ENCOUNTER — Encounter (HOSPITAL_COMMUNITY)
Admission: RE | Disposition: A | Discharge status: Home / Self Care | Payer: Self-pay | Source: Ambulatory Visit | Attending: Cardiology

## 2021-10-07 ENCOUNTER — Other Ambulatory Visit: Payer: Self-pay

## 2021-10-07 ENCOUNTER — Ambulatory Visit (HOSPITAL_COMMUNITY)
Admission: RE | Admit: 2021-10-07 | Discharge: 2021-10-07 | Disposition: A | Discharge status: Home / Self Care | Payer: Medicare Other | Source: Ambulatory Visit | Attending: Cardiology | Admitting: Cardiology

## 2021-10-07 ENCOUNTER — Ambulatory Visit (HOSPITAL_COMMUNITY): Payer: Medicare Other | Admitting: Certified Registered Nurse Anesthetist

## 2021-10-07 DIAGNOSIS — G4733 Obstructive sleep apnea (adult) (pediatric): Secondary | ICD-10-CM | POA: Diagnosis not present

## 2021-10-07 DIAGNOSIS — Z9989 Dependence on other enabling machines and devices: Secondary | ICD-10-CM | POA: Diagnosis not present

## 2021-10-07 DIAGNOSIS — E785 Hyperlipidemia, unspecified: Secondary | ICD-10-CM | POA: Diagnosis not present

## 2021-10-07 DIAGNOSIS — I10 Essential (primary) hypertension: Secondary | ICD-10-CM

## 2021-10-07 DIAGNOSIS — Z6837 Body mass index (BMI) 37.0-37.9, adult: Secondary | ICD-10-CM | POA: Diagnosis not present

## 2021-10-07 DIAGNOSIS — Z7984 Long term (current) use of oral hypoglycemic drugs: Secondary | ICD-10-CM | POA: Insufficient documentation

## 2021-10-07 DIAGNOSIS — I2510 Atherosclerotic heart disease of native coronary artery without angina pectoris: Secondary | ICD-10-CM | POA: Insufficient documentation

## 2021-10-07 DIAGNOSIS — E119 Type 2 diabetes mellitus without complications: Secondary | ICD-10-CM | POA: Diagnosis not present

## 2021-10-07 DIAGNOSIS — I4819 Other persistent atrial fibrillation: Secondary | ICD-10-CM | POA: Insufficient documentation

## 2021-10-07 DIAGNOSIS — Z87891 Personal history of nicotine dependence: Secondary | ICD-10-CM | POA: Diagnosis not present

## 2021-10-07 DIAGNOSIS — G4730 Sleep apnea, unspecified: Secondary | ICD-10-CM | POA: Diagnosis not present

## 2021-10-07 DIAGNOSIS — E039 Hypothyroidism, unspecified: Secondary | ICD-10-CM | POA: Diagnosis not present

## 2021-10-07 DIAGNOSIS — I4891 Unspecified atrial fibrillation: Secondary | ICD-10-CM | POA: Diagnosis not present

## 2021-10-07 HISTORY — PX: ATRIAL FIBRILLATION ABLATION: EP1191

## 2021-10-07 LAB — GLUCOSE, CAPILLARY
Glucose-Capillary: 168 mg/dL — ABNORMAL HIGH (ref 70–99)
Glucose-Capillary: 147 mg/dL — ABNORMAL HIGH (ref 70–99)

## 2021-10-07 LAB — POCT ACTIVATED CLOTTING TIME
Activated Clotting Time: 299 seconds
Activated Clotting Time: 311 seconds

## 2021-10-07 SURGERY — ATRIAL FIBRILLATION ABLATION
Anesthesia: General

## 2021-10-07 MED ORDER — ACETAMINOPHEN 500 MG PO TABS: 1000.0000 mg | ORAL_TABLET | Freq: Once | ORAL | Status: AC

## 2021-10-07 MED ORDER — PHENYLEPHRINE HCL-NACL 20-0.9 MG/250ML-% IV SOLN
INTRAVENOUS | Status: DC | PRN
  Administered 2021-10-07: 20 ug/min via INTRAVENOUS

## 2021-10-07 MED ORDER — SODIUM CHLORIDE 0.9 % IV SOLN: INTRAVENOUS | Status: DC

## 2021-10-07 MED ORDER — MIDAZOLAM HCL 2 MG/2ML IJ SOLN
INTRAMUSCULAR | Status: DC | PRN
  Administered 2021-10-07: 2 mg via INTRAVENOUS

## 2021-10-07 MED ORDER — PROTAMINE SULFATE 10 MG/ML IV SOLN
INTRAVENOUS | Status: DC | PRN
  Administered 2021-10-07: 40 mg via INTRAVENOUS

## 2021-10-07 MED ORDER — HEPARIN SODIUM (PORCINE) 1000 UNIT/ML IJ SOLN
INTRAMUSCULAR | Status: AC
Start: 2021-10-07 — End: ?
  Filled 2021-10-07: qty 10

## 2021-10-07 MED ORDER — PROPOFOL 10 MG/ML IV BOLUS
INTRAVENOUS | Status: DC | PRN
  Administered 2021-10-07: 30 mg via INTRAVENOUS
  Administered 2021-10-07: 140 mg via INTRAVENOUS

## 2021-10-07 MED ORDER — DOBUTAMINE INFUSION FOR EP/ECHO/NUC (1000 MCG/ML)
INTRAVENOUS | Status: AC
  Filled 2021-10-07: qty 250

## 2021-10-07 MED ORDER — HEPARIN (PORCINE) IN NACL 1000-0.9 UT/500ML-% IV SOLN
INTRAVENOUS | Status: AC
  Filled 2021-10-07: qty 2500

## 2021-10-07 MED ORDER — SUGAMMADEX SODIUM 200 MG/2ML IV SOLN
INTRAVENOUS | Status: DC | PRN
  Administered 2021-10-07: 200 mg via INTRAVENOUS

## 2021-10-07 MED ORDER — ROCURONIUM BROMIDE 10 MG/ML (PF) SYRINGE
PREFILLED_SYRINGE | INTRAVENOUS | Status: DC | PRN
  Administered 2021-10-07: 60 mg via INTRAVENOUS

## 2021-10-07 MED ORDER — SODIUM CHLORIDE 0.9% FLUSH: 3.0000 mL | INTRAVENOUS | Status: DC | PRN

## 2021-10-07 MED ORDER — HEPARIN (PORCINE) IN NACL 1000-0.9 UT/500ML-% IV SOLN
INTRAVENOUS | Status: DC | PRN
  Administered 2021-10-07 (×5): 500 mL

## 2021-10-07 MED ORDER — LIDOCAINE 2% (20 MG/ML) 5 ML SYRINGE
INTRAMUSCULAR | Status: DC | PRN
  Administered 2021-10-07: 60 mg via INTRAVENOUS

## 2021-10-07 MED ORDER — SODIUM CHLORIDE 0.9 % IV SOLN: 250.0000 mL | INTRAVENOUS | Status: DC | PRN

## 2021-10-07 MED ORDER — FENTANYL CITRATE (PF) 100 MCG/2ML IJ SOLN
INTRAMUSCULAR | Status: DC | PRN
  Administered 2021-10-07: 100 ug via INTRAVENOUS

## 2021-10-07 MED ORDER — ONDANSETRON HCL 4 MG/2ML IJ SOLN: 4.0000 mg | Freq: Four times a day (QID) | INTRAMUSCULAR | Status: DC | PRN

## 2021-10-07 MED ORDER — ACETAMINOPHEN 500 MG PO TABS
ORAL_TABLET | ORAL | Status: AC
  Administered 2021-10-07: 1000 mg via ORAL
  Filled 2021-10-07: qty 2

## 2021-10-07 MED ORDER — ONDANSETRON HCL 4 MG/2ML IJ SOLN
INTRAMUSCULAR | Status: DC | PRN
  Administered 2021-10-07: 4 mg via INTRAVENOUS

## 2021-10-07 MED ORDER — HEPARIN SODIUM (PORCINE) 1000 UNIT/ML IJ SOLN
INTRAMUSCULAR | Status: DC | PRN
  Administered 2021-10-07: 4000 [IU] via INTRAVENOUS
  Administered 2021-10-07: 15000 [IU] via INTRAVENOUS
  Administered 2021-10-07: 2000 [IU] via INTRAVENOUS

## 2021-10-07 MED ORDER — HEPARIN SODIUM (PORCINE) 1000 UNIT/ML IJ SOLN
INTRAMUSCULAR | Status: DC | PRN
  Administered 2021-10-07: 1000 [IU] via INTRAVENOUS

## 2021-10-07 MED ORDER — ACETAMINOPHEN 325 MG PO TABS
650.0000 mg | ORAL_TABLET | ORAL | Status: DC | PRN
  Administered 2021-10-07: 650 mg via ORAL
  Filled 2021-10-07: qty 2

## 2021-10-07 MED ORDER — DOBUTAMINE INFUSION FOR EP/ECHO/NUC (1000 MCG/ML)
INTRAVENOUS | Status: DC | PRN
  Administered 2021-10-07: 10 ug/kg/min via INTRAVENOUS

## 2021-10-07 MED ORDER — DEXAMETHASONE SODIUM PHOSPHATE 10 MG/ML IJ SOLN
INTRAMUSCULAR | Status: DC | PRN
  Administered 2021-10-07: 5 mg via INTRAVENOUS

## 2021-10-07 SURGICAL SUPPLY — 20 items
BAG SNAP BAND KOVER 36X36 (MISCELLANEOUS) ×1 IMPLANT
CATH OCTARAY 2.0 F 3-3-3-3-3 (CATHETERS) ×1 IMPLANT
CATH S CIRCA THERM PROBE 10F (CATHETERS) ×1 IMPLANT
CATH SMTCH THERMOCOOL SF DF (CATHETERS) ×1 IMPLANT
CATH SOUNDSTAR ECO 8FR (CATHETERS) ×1 IMPLANT
CATH WEB BI DIR CSDF CRV REPRO (CATHETERS) ×1 IMPLANT
CLOSURE PERCLOSE PROSTYLE (VASCULAR PRODUCTS) ×4 IMPLANT
COVER SWIFTLINK CONNECTOR (BAG) ×2 IMPLANT
KIT VERSACROSS STEERABLE D1 (CATHETERS) ×1 IMPLANT
MAT PREVALON FULL STRYKER (MISCELLANEOUS) ×1 IMPLANT
PACK EP LATEX FREE (CUSTOM PROCEDURE TRAY) ×2
PACK EP LF (CUSTOM PROCEDURE TRAY) ×1 IMPLANT
PAD DEFIB RADIO PHYSIO CONN (PAD) ×2 IMPLANT
PATCH CARTO3 (PAD) ×1 IMPLANT
SHEATH CARTO VIZIGO SM CVD (SHEATH) ×1 IMPLANT
SHEATH PINNACLE 7F 10CM (SHEATH) ×1 IMPLANT
SHEATH PINNACLE 8F 10CM (SHEATH) ×2 IMPLANT
SHEATH PINNACLE 9F 10CM (SHEATH) ×1 IMPLANT
SHEATH PROBE COVER 6X72 (BAG) ×1 IMPLANT
TUBING SMART ABLATE COOLFLOW (TUBING) ×1 IMPLANT

## 2021-10-07 NOTE — Transfer of Care (Signed)
Immediate Anesthesia Transfer of Care Note  Patient: Melissa Boyer  Procedure(s) Performed: ATRIAL FIBRILLATION ABLATION  Patient Location: PACU and Cath Lab  Anesthesia Type:General  Level of Consciousness: drowsy, patient cooperative and responds to stimulation  Airway & Oxygen Therapy: Patient Spontanous Breathing and Patient connected to nasal cannula oxygen  Post-op Assessment: Report given to RN and Post -op Vital signs reviewed and stable  Post vital signs: Reviewed and stable  Last Vitals:  Vitals Value Taken Time  BP    Temp    Pulse    Resp    SpO2      Last Pain:  Vitals:   10/07/21 0840  TempSrc: Oral         Complications: There were no known notable events for this encounter.

## 2021-10-07 NOTE — Discharge Instructions (Addendum)
Cardiac Ablation, Care After  This sheet gives you information about how to care for yourself after your procedure. Your health care provider may also give you more specific instructions. If you have problems or questions, contact your health care provider. What can I expect after the procedure? After the procedure, it is common to have: Bruising around your puncture site. Tenderness around your puncture site. Skipped heartbeats. Tiredness (fatigue).  Follow these instructions at home: Puncture site care  Follow instructions from your health care provider about how to take care of your puncture site. Make sure you: If present, leave stitches (sutures), skin glue, or adhesive strips in place. These skin closures may need to stay in place for up to 2 weeks. If adhesive strip edges start to loosen and curl up, you may trim the loose edges. Do not remove adhesive strips completely unless your health care provider tells you to do that. If a large square bandage is present, this may be removed 24 hours after surgery.  Check your puncture site every day for signs of infection. Check for: Redness, swelling, or pain. Fluid or blood. If your puncture site starts to bleed, lie down on your back, apply firm pressure to the area, and contact your health care provider. Warmth. Pus or a bad smell. A pea or small marble sized lump at the site is normal and can take up to three months to resolve.  Driving Do not drive for at least 4 days after your procedure or however long your health care provider recommends. (Do not resume driving if you have previously been instructed not to drive for other health reasons.) Do not drive or use heavy machinery while taking prescription pain medicine. Activity Avoid activities that take a lot of effort for at least 7 days after your procedure. Do not lift anything that is heavier than 5 lb (4.5 kg) for one week.  No sexual activity for 1 week.  Return to your normal  activities as told by your health care provider. Ask your health care provider what activities are safe for you. General instructions Take over-the-counter and prescription medicines only as told by your health care provider. Do not use any products that contain nicotine or tobacco, such as cigarettes and e-cigarettes. If you need help quitting, ask your health care provider. You may shower after 24 hours, but Do not take baths, swim, or use a hot tub for 1 week.  Do not drink alcohol for 24 hours after your procedure. Keep all follow-up visits as told by your health care provider. This is important. Contact a health care provider if: You have redness, mild swelling, or pain around your puncture site. You have fluid or blood coming from your puncture site that stops after applying firm pressure to the area. Your puncture site feels warm to the touch. You have pus or a bad smell coming from your puncture site. You have a fever. You have chest pain or discomfort that spreads to your neck, jaw, or arm. You are sweating a lot. You feel nauseous. You have a fast or irregular heartbeat. You have shortness of breath. You are dizzy or light-headed and feel the need to lie down. You have pain or numbness in the arm or leg closest to your puncture site. Get help right away if: Your puncture site suddenly swells. Your puncture site is bleeding and the bleeding does not stop after applying firm pressure to the area. These symptoms may represent a serious problem that is   an emergency. Do not wait to see if the symptoms will go away. Get medical help right away. Call your local emergency services (911 in the [place] Do not drive yourself to the hospital. Summary After the procedure, it is normal to have bruising and tenderness at the puncture site in your groin, neck, or forearm. Check your puncture site every day for signs of infection. Get help right away if your puncture site is bleeding and the  bleeding does not stop after applying firm pressure to the area. This is a medical emergency. This information is not intended to replace advice given to you by your health care provider. Make sure you discuss any questions you have with your health care provider.  Post procedure care instructions No driving for 4 days. No lifting over 5 lbs for 1 week. No vigorous or sexual activity for 1 week. You may return to work/your usual activities on 10/14/21. Keep procedure site clean & dry. If you notice increased pain, swelling, bleeding or pus, call/return!  You may shower after 24 hours, but no soaking in baths/hot tubs/pools for 1 week.   You have an appointment set up with the [facility].  Multiple studies have shown that being followed by a dedicated atrial fibrillation clinic in addition to the standard care you receive from your other physicians improves health. We believe that enrollment in the atrial fibrillation clinic will allow [place] to better care for you.   The phone number to the [facility] is [phone]. The clinic is staffed Monday through Friday from 8:30am to 5pm.  Parking Directions: The clinic is located in the Heart and Vascular Building connected to [facility]. 1)From [address] turn on to [address] and go to the 3rd entrance  (Heart and Vascular entrance) on the right. 2)Look to the right for Heart &Vascular Parking Garage. 3)A code for the entrance is required, for July is 1404.   4)Take the elevators to the 1st floor. Registration is in the room with the glass walls at the end of the hallway.  If you have any trouble parking or locating the clinic, please don't hesitate to call [phone].

## 2021-10-07 NOTE — H&P (Signed)
Electrophysiology Office Note   Date:  10/07/2021   ID:  Melissa Boyer, DOB 04-15-1956, MRN [id number]  PCP:  Cher Nakai, MD  Cardiologist:  [facility] Primary Electrophysiologist:  Deylan Canterbury Meredith Leeds, MD    Chief Complaint: AF   History of Present Illness: Melissa Boyer is a 66 y.o. female who is being seen today for the evaluation of AF at the request of No ref. provider found. Presenting today for electrophysiology evaluation.  She has a history significant for diabetes, hyperlipidemia, atrial fibrillation, sleep apnea, former tobacco abuse, nonobstructive coronary artery disease.  She wore a cardiac monitor that showed a 1% atrial fibrillation burden.  She was initially admitted to the hospital for dofetilide load but did not convert to sinus rhythm with cardioversion.  She has been on amiodarone.  She had initially plans for ablation 01/30/2021 but has been having issues with ventral hernia and kidney lesions.  She is status post cardioversion 01/14/2021.  Today, denies symptoms of palpitations, chest pain, shortness of breath, orthopnea, PND, lower extremity edema, claudication, dizziness, presyncope, syncope, bleeding, or neurologic sequela. The patient is tolerating medications without difficulties. Plan ablation today.    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   Hypercholesteremia    Hypertension    Left renal mass 12/29/2019   Mixed hyperlipidemia 02/27/2019   Obesity (BMI 35.0-39.9 without  comorbidity) 12/29/2019   Obstructive sleep apnea on CPAP 06/19/2019   Paroxysmal atrial fibrillation ([place]) 02/27/2019   Pure hypercholesterolemia 01/01/2014   Thyroid disease    hypothyroid   Unspecified hypothyroidism 01/01/2014   Past Surgical History:  Procedure Laterality Date   BUBBLE STUDY  10/16/2020   Procedure: BUBBLE STUDY;  Surgeon: Elouise Munroe, MD;  Location: [place];  Service: Cardiovascular;;   CARDIOVERSION N/A 10/16/2020   Procedure: CARDIOVERSION;  Surgeon: Elouise Munroe, MD;  Location: [facility] ENDOSCOPY;  Service: Cardiovascular;  Laterality: N/A;   CARDIOVERSION N/A 12/04/2020   Procedure: CARDIOVERSION;  Surgeon: Buford Dresser, MD;  Location: [place];  Service: Cardiovascular;  Laterality: N/A;   CARDIOVERSION N/A 01/14/2021   Procedure: CARDIOVERSION (CATH LAB);  Surgeon: Constance Haw, MD;  Location: [place] CV LAB;  Service: Cardiovascular;  Laterality: N/A;   COLPOSCOPY  10/98   normal, no biopsy   ENDOMETRIAL ABLATION  12/02/2002   ENDOMETRIAL BIOPSY  5/03   IR RADIOLOGIST EVAL & MGMT  11/05/2020   RIGHT/LEFT HEART CATH AND CORONARY ANGIOGRAPHY N/A 07/18/2020   Procedure: RIGHT/LEFT HEART CATH AND CORONARY ANGIOGRAPHY;  Surgeon: Jettie Booze, MD;  Location: [place] CV LAB;  Service: Cardiovascular;  Laterality: N/A;   TEE WITHOUT CARDIOVERSION N/A 10/16/2020   Procedure: TRANSESOPHAGEAL ECHOCARDIOGRAM (TEE);  Surgeon: Elouise Munroe, MD;  Location[facility] END[facility]COPY;  Service: Cardiovascular;  Laterality: N/A;   THYROIDECTOMY, PARTIAL     thyroid nodule excision left side     Current Facility-Administered Medications  Medication Dose Route Frequency Provider  Last Rate Last Admin   0.9 %  sodium chloride infusion   Intravenous Continuous Constance Haw, MD 50 mL/hr at 10/07/21 0923 New Bag at 10/07/21 9371    Allergies:   Tape   Social History:  The patient  reports that she quit smoking about 18 years ago. Her  smoking use included cigarettes. She has a 30.00 pack-year smoking history. She has never used smokeless tobacco. She reports that she does not drink alcohol and does not use drugs.   Family History:  The patient's family history includes Cancer in her maternal aunt, mother, and sister; Diabetes in her maternal grandfather; Emphysema in her father; Heart attack in her maternal aunt and maternal grandmother; Hypertension in her father.   ROS:  Please see the history of present illness.   Otherwise, review of systems is positive for none.   All other systems are reviewed and negative.   PHYSICAL EXAM: VS:  BP (!) 163/107   Pulse 67   Temp 97.6 F (36.4 C) (Oral)   Resp 18   Ht 5' 6.5" (1.689 m)   Wt 106.6 kg   LMP 12/02/2002 (Approximate)   SpO2 96%   BMI 37.36 kg/m  , BMI Body mass index is 37.36 kg/m. GEN: Well nourished, well developed, in no acute distress  HEENT: normal  Neck: no JVD, carotid bruits, or masses Cardiac: irregular; no murmurs, rubs, or gallops,no edema  Respiratory:  clear to auscultation bilaterally, normal work of breathing GI: soft, nontender, nondistended, + BS MS: no deformity or atrophy  Skin: warm and dry Neuro:  Strength and sensation are intact Psych: euthymic mood, full affect   Recent Labs: 12/03/2020: Magnesium 1.9 09/29/2021: BUN 22; Creatinine, Ser 0.95; Hemoglobin 13.8; Platelets 231; Potassium 3.5; Sodium 138    Lipid Panel  No results found for: CHOL, TRIG, HDL, CHOLHDL, VLDL, LDLCALC, LDLDIRECT   Wt Readings from Last 3 Encounters:  10/07/21 106.6 kg  08/28/21 110.9 kg  04/23/21 108.8 kg      Other studies Reviewed: Additional studies/ records that were reviewed today include: TTE 07/28/20  Review of the above records today demonstrates:   1. Left ventricular ejection fraction, by estimation, is 60 to 65%. The  left ventricle has normal function. The left ventricle has no regional  wall motion abnormalities. Left ventricular  diastolic parameters are  indeterminate.   2. Right ventricular systolic function is normal. The right ventricular  size is normal. There is normal pulmonary artery systolic pressure.   3. Left atrial size was mild to moderately dilated.   4. The mitral valve is normal in structure. No evidence of mitral valve  regurgitation. No evidence of mitral stenosis.   5. The aortic valve is normal in structure. Aortic valve regurgitation is  not visualized. No aortic stenosis is present.   6. There is mild dilatation of the ascending aorta, measuring 38 mm.   7. The inferior vena cava is normal in size with greater than 50%  respiratory variability, suggesting right atrial pressure of 3 mmHg.   Cardiac monitor 09/02/2020 personally reviewed Atrial Fibrillation occurred continuously (100% burden), ranging from 39-151 bpm (avg of 65 bpm).   Premature ventricular complexes were frequent (5.2%, [id number]). Ventricular Bigeminy and Trigeminy were present.   18 patient triggered events in 26 diary events were associated with the atrial fibrillation and premature ventricular complex.  RHC/LHC 07/18/20 Ost Cx lesion is 25% stenosed. Mid LAD lesion is 25% stenosed. The left ventricular systolic function is normal.  LV end diastolic pressure is mildly elevated. The left ventricular ejection fraction is 55-65% by visual estimate. There is no aortic valve stenosis. Ao sat 99%, PA sat 73%, PA pressure 32/12, mean PA 25 mm Hg; mean PCWP 20 mm Hg; CO 5.1 L/min; CI 2.38  ASSESSMENT AND PLAN:  1.  Persistent atrial fibrillation: Melissa Boyer has presented today for surgery, with the diagnosis of AF.  The various methods of treatment have been discussed with the patient and family. After consideration of risks, benefits and other options for treatment, the patient has consented to  Procedure(s): Catheter ablation as a surgical intervention .  Risks include but not limited to complete heart block, stroke,  esophageal damage, nerve damage, bleeding, vascular damage, tamponade, perforation, MI, and death. The patient's history has been reviewed, patient examined, no change in status, stable for surgery.  I have reviewed the patient's chart and labs.  Questions were answered to the patient's satisfaction.    Romello Hoehn Curt Bears, MD 10/07/2021 9:56 AM

## 2021-10-07 NOTE — Anesthesia Preprocedure Evaluation (Addendum)
Anesthesia Evaluation  Patient identified by MRN, date of birth, ID band Patient awake    Reviewed: Allergy & Precautions, H&P , NPO status , Patient's Chart, lab work & pertinent test results, reviewed documented beta blocker date and time   Airway Mallampati: III  TM Distance: >3 FB Neck ROM: Full    Dental no notable dental hx. (+) Teeth Intact, Dental Advisory Given   Pulmonary sleep apnea and Continuous Positive Airway Pressure Ventilation , former smoker,    Pulmonary exam normal breath sounds clear to auscultation       Cardiovascular hypertension, Pt. on medications and Pt. on home beta blockers + DOE  + dysrhythmias Atrial Fibrillation  Rhythm:Regular Rate:Normal     Neuro/Psych negative neurological ROS  negative psych ROS   GI/Hepatic negative GI ROS, Neg liver ROS,   Endo/Other  diabetes, Type 2, Oral Hypoglycemic AgentsHypothyroidism Morbid obesity  Renal/GU negative Renal ROS  negative genitourinary   Musculoskeletal   Abdominal   Peds  Hematology negative hematology ROS (+)   Anesthesia Other Findings   Reproductive/Obstetrics negative OB ROS                            Anesthesia Physical Anesthesia Plan  ASA: 3  Anesthesia Plan: General   Post-op Pain Management: Tylenol PO (pre-op)*   Induction: Intravenous  PONV Risk Score and Plan: 4 or greater and Ondansetron, Dexamethasone and Midazolam  Airway Management Planned: Oral ETT  Additional Equipment:   Intra-op Plan:   Post-operative Plan: Extubation in OR  Informed Consent: I have reviewed the patients History and Physical, chart, labs and discussed the procedure including the risks, benefits and alternatives for the proposed anesthesia with the patient or authorized representative who has indicated his/her understanding and acceptance.     Dental advisory given  Plan Discussed with: CRNA  Anesthesia  Plan Comments:         Anesthesia Quick Evaluation

## 2021-10-07 NOTE — Anesthesia Postprocedure Evaluation (Signed)
Anesthesia Post Note  Patient: Melissa Boyer  Procedure(s) Performed: ATRIAL FIBRILLATION ABLATION     Patient location during evaluation: Cath Lab Anesthesia Type: General Level of consciousness: awake and alert Pain management: pain level controlled Vital Signs Assessment: post-procedure vital signs reviewed and stable Respiratory status: spontaneous breathing, nonlabored ventilation and respiratory function stable Cardiovascular status: blood pressure returned to baseline and stable Postop Assessment: no apparent nausea or vomiting Anesthetic complications: no   There were no known notable events for this encounter.  Last Vitals:  Vitals:   10/07/21 1400 10/07/21 1405  BP: (!) 149/61 140/78  Pulse: (!) 56 (!) 41  Resp: 14 12  Temp:    SpO2: 97% 96%    Last Pain:  Vitals:   10/07/21 1425  TempSrc:   PainSc: 6                  Hill Mackie,W. EDMOND

## 2021-10-07 NOTE — Anesthesia Procedure Notes (Signed)
Procedure Name: Intubation Date/Time: 10/07/2021 11:11 AM Performed by: Janace Litten, CRNA Pre-anesthesia Checklist: Patient identified, Emergency Drugs available, Suction available and Patient being monitored Patient Re-evaluated:Patient Re-evaluated prior to induction Oxygen Delivery Method: Circle System Utilized Preoxygenation: Pre-oxygenation with 100% oxygen Induction Type: IV induction Ventilation: Mask ventilation without difficulty Laryngoscope Size: Mac and 3 Grade View: Grade I Tube type: Oral Tube size: 7.0 mm Number of attempts: 1 Airway Equipment and Method: Stylet and Oral airway Placement Confirmation: ETT inserted through vocal cords under direct vision, positive ETCO2 and breath sounds checked- equal and bilateral Secured at: 22 cm Tube secured with: Tape Dental Injury: Teeth and Oropharynx as per pre-operative assessment

## 2021-10-07 NOTE — Progress Notes (Signed)
Pt arrived with pure wick in place-education given

## 2021-10-08 ENCOUNTER — Encounter (HOSPITAL_COMMUNITY): Payer: Self-pay | Admitting: Cardiology

## 2021-10-12 ENCOUNTER — Ambulatory Visit: Payer: 59 | Admitting: Cardiology

## 2021-11-05 ENCOUNTER — Ambulatory Visit (HOSPITAL_COMMUNITY): Payer: Medicare Other | Admitting: Physician Assistant

## 2021-11-16 ENCOUNTER — Ambulatory Visit: Payer: 59 | Admitting: Cardiology

## 2021-11-16 ENCOUNTER — Ambulatory Visit (HOSPITAL_COMMUNITY): Payer: Medicare Other | Admitting: Physician Assistant

## 2021-11-26 ENCOUNTER — Ambulatory Visit (HOSPITAL_COMMUNITY)
Admission: RE | Admit: 2021-11-26 | Discharge: 2021-11-26 | Disposition: A | Discharge status: Home / Self Care | Payer: Medicare Other | Source: Ambulatory Visit | Attending: Physician Assistant | Admitting: Physician Assistant

## 2021-11-26 VITALS — BP 162/82 | HR 52 | Ht 66.5 in | Wt 241.2 lb

## 2021-11-26 DIAGNOSIS — I4892 Unspecified atrial flutter: Secondary | ICD-10-CM | POA: Insufficient documentation

## 2021-11-26 DIAGNOSIS — I10 Essential (primary) hypertension: Secondary | ICD-10-CM | POA: Diagnosis not present

## 2021-11-26 DIAGNOSIS — I2510 Atherosclerotic heart disease of native coronary artery without angina pectoris: Secondary | ICD-10-CM | POA: Diagnosis not present

## 2021-11-26 DIAGNOSIS — Z6838 Body mass index (BMI) 38.0-38.9, adult: Secondary | ICD-10-CM | POA: Diagnosis not present

## 2021-11-26 DIAGNOSIS — I4819 Other persistent atrial fibrillation: Secondary | ICD-10-CM | POA: Diagnosis present

## 2021-11-26 DIAGNOSIS — E785 Hyperlipidemia, unspecified: Secondary | ICD-10-CM | POA: Diagnosis not present

## 2021-11-26 DIAGNOSIS — D6869 Other thrombophilia: Secondary | ICD-10-CM | POA: Insufficient documentation

## 2021-11-26 DIAGNOSIS — E669 Obesity, unspecified: Secondary | ICD-10-CM | POA: Insufficient documentation

## 2021-11-26 DIAGNOSIS — Z7901 Long term (current) use of anticoagulants: Secondary | ICD-10-CM | POA: Insufficient documentation

## 2021-11-26 DIAGNOSIS — Z79899 Other long term (current) drug therapy: Secondary | ICD-10-CM | POA: Diagnosis not present

## 2021-11-26 DIAGNOSIS — G4733 Obstructive sleep apnea (adult) (pediatric): Secondary | ICD-10-CM | POA: Diagnosis not present

## 2021-11-26 NOTE — Progress Notes (Signed)
Primary Care Physician: Melissa Nakai, MD Primary Cardiologist: Dr Harriet Masson Primary Electrophysiologist: Dr Curt Bears  Referring Physician: Dr Phineas Inches is a 66 y.o. female with a history of DM, HLD, OSA, CAD, atrial fibrillation who presents for follow up in the [facility].  The patient was initially diagnosed with atrial fibrillation in 2019. Patient is on Eliquis for a CHADS2VASC score of 4. She underwent TEE guided DCCV on 10/16/20 but had early return of her afib. She was seen by Dr Curt Bears on 10/20/20 and dofetilide admission was recommended. Patient unfortunately failed to convert to SR and dofetilide was switched to amiodarone. She continued to have episodes of afib and is s/p afib and flutter ablation with Dr Curt Bears on 10/07/21.  On follow up today, patient reports that she has done well since ablation. She did have some episodes of afib immediately after ablation but these have subsided in recent weeks. She denies CP, swallowing pain, or groin issues.   Today, she denies symptoms of palpitations, chest pain, shortness of breath, orthopnea, PND, lower extremity edema, dizziness, presyncope, syncope, bleeding, or neurologic sequela. The patient is tolerating medications without difficulties and is otherwise without complaint today.    Atrial Fibrillation Risk Factors:  she does have symptoms or diagnosis of sleep apnea. she is compliant with CPAP therapy. she does not have a history of rheumatic fever.   she has a BMI of Body mass index is 38.35 kg/m.Marland Kitchen Filed Weights   11/26/21 1522  Weight: 109.4 kg    Family History  Problem Relation Age of Onset   Cancer Mother        lung,    Hypertension Father    Emphysema Father    Cancer Sister        tongue   Cancer Maternal Aunt        colon, cervical, throat   Heart attack Maternal Aunt    Diabetes Maternal Grandfather    Heart attack Maternal Grandmother      Atrial Fibrillation  Management history:  Previous antiarrhythmic drugs: dofetilide, amiodarone  Previous cardioversions: 10/16/20, 12/04/20, 01/14/21 Previous ablations: 10/07/21 CHADS2VASC score: 4 Anticoagulation history: Eliquis   Past Medical History:  Diagnosis Date   Abnormal weight gain 07/10/2020   Atrial fibrillation ([place])    Change in bowel habit 07/10/2020   DES exposure in utero    DM2 (diabetes mellitus, type 2) ([place])    Essential hypertension 01/01/2014   Family history of breast cancer in first degree relative 01/01/2014   Family history of colonic polyps 07/10/2020   Fibroids    Hepatitis C 11/99   2 yr tx, "remission", told Ag neg   History of diabetes mellitus    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   Hypercholesteremia    Hypertension    Left renal mass 12/29/2019   Mixed hyperlipidemia 02/27/2019   Obesity (BMI 35.0-39.9 without comorbidity) 12/29/2019   Obstructive sleep apnea on CPAP 06/19/2019   Paroxysmal atrial fibrillation ([place]) 02/27/2019   Pure hypercholesterolemia 01/01/2014   Thyroid disease    hypothyroid   Unspecified hypothyroidism 01/01/2014   Past Surgical History:  Procedure Laterality Date   ATRIAL FIBRILLATION ABLATION N/A 10/07/2021   Procedure: ATRIAL FIBRILLATION ABLATION;  Surgeon: Constance Haw, MD;  Location: [place] [facility];  Service: Cardiovascular;  Laterality: N/A;   BUBBLE STUDY  10/16/2020   Procedure: BUBBLE STUDY;  Surgeon: Elouise Munroe, MD;  Location: [facility]  ENDOSCOPY;  Service: Cardiovascular;;   CARDIOVERSION N/A 10/16/2020   Procedure: CARDIOVERSION;  Surgeon: Elouise Munroe, MD;  Location: [facility] ENDOSCOPY;  Service: Cardiovascular;  Laterality: N/A;   CARDIOVERSION N/A 12/04/2020   Procedure: CARDIOVERSION;  Surgeon: Buford Dresser, MD;  Location: [place];  Service: Cardiovascular;  Laterality: N/A;   CARDIOVERSION N/A 01/14/2021   Procedure: CARDIOVERSION (CATH LAB);  Surgeon: Constance Haw, MD;   Location: [place] [facility];  Service: Cardiovascular;  Laterality: N/A;   COLPOSCOPY  10/98   normal, no biopsy   ENDOMETRIAL ABLATION  12/02/2002   ENDOMETRIAL BIOPSY  5/03   IR RADIOLOGIST EVAL & MGMT  11/05/2020   RIGHT/LEFT HEART CATH AND CORONARY ANGIOGRAPHY N/A 07/18/2020   Procedure: RIGHT/LEFT HEART CATH AND CORONARY ANGIOGRAPHY;  Surgeon: Jettie Booze, MD;  Location: [place] [facility];  Service: Cardiovascular;  Laterality: N/A;   TEE WITHOUT CARDIOVERSION N/A 10/16/2020   Procedure: TRANSESOPHAGEAL ECHOCARDIOGRAM (TEE);  Surgeon: Elouise Munroe, MD;  Location: [facility] ENDOSCOPY;  Service: Cardiovascular;  Laterality: N/A;   THYROIDECTOMY, PARTIAL     thyroid nodule excision left side    Current Outpatient Medications  Medication Sig Dispense Refill   acetaminophen (TYLENOL) 650 MG CR tablet Take 1,300 mg by mouth every 8 (eight) hours as needed for pain.     ALPRAZolam (XANAX) 0.5 MG tablet Take 0.25 mg by mouth daily as needed for anxiety.     amiodarone (PACERONE) 200 MG tablet TAKE 1 TABLET(200 MG) BY MOUTH DAILY 30 tablet 10   amLODipine (NORVASC) 5 MG tablet Take 1 tablet (5 mg total) by mouth daily. (Patient taking differently: Take 2.5 mg by mouth daily.) 90 tablet 0   apixaban (ELIQUIS) 5 MG TABS tablet Take 1 tablet (5 mg total) by mouth 2 (two) times daily. 60 tablet 13   atorvastatin (LIPITOR) 20 MG tablet Take 20 mg by mouth every evening.     gabapentin (NEURONTIN) 100 MG capsule Take 100 mg by mouth 2 (two) times daily as needed (pain).     hydrochlorothiazide (HYDRODIURIL) 25 MG tablet Take 0.5 tablets (12.5 mg total) by mouth daily. 45 tablet 3   levothyroxine (SYNTHROID, LEVOTHROID) 125 MCG tablet Take 125 mcg by mouth daily before breakfast.     losartan (COZAAR) 100 MG tablet Take 100 mg by mouth daily.     metFORMIN (GLUCOPHAGE) 500 MG tablet Take 1 tablet (500 mg total) by mouth 2 (two) times daily with a meal. (Patient taking differently: Take 1,000 mg by  mouth 2 (two) times daily with a meal.)     metoprolol succinate (TOPROL-XL) 100 MG 24 hr tablet Take 100 mg by mouth daily.     nitroGLYCERIN (NITROSTAT) 0.4 MG SL tablet Place 0.4 mg under the tongue every 5 (five) minutes as needed for chest pain.     triazolam (HALCION) 0.25 MG tablet Take 0.5 mg by mouth at bedtime.     No current facility-administered medications for this encounter.    Allergies  Allergen Reactions   Tape     Adhesives  Paper tape takes off skin      Social History   Socioeconomic History   Marital status: Divorced    Spouse name: Not on file   Number of children: 1   Years of education: Not on file   Highest education level: Not on file  Occupational History   Not on file  Tobacco Use   Smoking status: Former    Packs/day: 1.00  Years: 30.00    Total pack years: 30.00    Types: Cigarettes    Quit date: 07/12/2003    Years since quitting: 18.3   Smokeless tobacco: Never  Vaping Use   Vaping Use: Never used  Substance and Sexual Activity   Alcohol use: Never   Drug use: No   Sexual activity: Yes    Partners: Male    Birth control/protection: Post-menopausal  Other Topics Concern   Not on file  Social History Narrative   Not on file   Social Determinants of Health   Financial Resource Strain: Not on file  Food Insecurity: Not on file  Transportation Needs: Not on file  Physical Activity: Not on file  Stress: Not on file  Social Connections: Not on file  Intimate Partner Violence: Not on file     ROS- All systems are reviewed and negative except as per the HPI above.  Physical Exam: Vitals:   11/26/21 1522  BP: (!) 162/82  Pulse: (!) 52  Weight: 109.4 kg  Height: 5' 6.5" (1.689 m)     GEN- The patient is a well appearing obese female, alert and oriented x 3 today.   HEENT-head normocephalic, atraumatic, sclera clear, conjunctiva pink, hearing intact, trachea midline. Lungs- Clear to ausculation bilaterally, normal work of  breathing Heart- Regular rate and rhythm, no murmurs, rubs or gallops  GI- soft, NT, ND, + BS Extremities- no clubbing, cyanosis, or edema MS- no significant deformity or atrophy Skin- no rash or lesion Psych- euthymic mood, full affect Neuro- strength and sensation are intact   Wt Readings from Last 3 Encounters:  11/26/21 109.4 kg  10/07/21 106.6 kg  08/28/21 110.9 kg    EKG today demonstrates  SB, 1st degree AV block Vent. rate 52 BPM PR interval 250 ms QRS duration 98 ms QT/QTcB 522/485 ms  Echo 07/28/20 demonstrated   1. Left ventricular ejection fraction, by estimation, is 60 to 65%. The  left ventricle has normal function. The left ventricle has no regional  wall motion abnormalities. Left ventricular diastolic parameters are  indeterminate.   2. Right ventricular systolic function is normal. The right ventricular  size is normal. There is normal pulmonary artery systolic pressure.   3. Left atrial size was mild to moderately dilated.   4. The mitral valve is normal in structure. No evidence of mitral valve  regurgitation. No evidence of mitral stenosis.   5. The aortic valve is normal in structure. Aortic valve regurgitation is  not visualized. No aortic stenosis is present.   6. There is mild dilatation of the ascending aorta, measuring 38 mm.   7. The inferior vena cava is normal in size with greater than 50%  respiratory variability, suggesting right atrial pressure of 3 mmHg.   Epic records are reviewed at length today  CHA2DS2-VASc Score = 4  The patient's score is based upon: CHF History: 0 HTN History: 0 Diabetes History: 1 Stroke History: 0 Vascular Disease History: 1 Age Score: 1 Gender Score: 1      ASSESSMENT AND PLAN: 1. Persistent Atrial Fibrillation/atrial flutter The patient's CHA2DS2-VASc score is 4, indicating a 4.8% annual risk of stroke.   She failed dofetilide.  S/p afib and flutter ablation 10/07/21 Patient in [place] today.  Continue  amiodarone 200 mg daily, hopefully will be able to discontinue this if she stays in SR. Continue Eliquis 5 mg BID with no missed doses for 3 months post ablation.  Continue Toprol 100 mg daily  2. Secondary Hypercoagulable State (ICD10:  D68.69) The patient is at significant risk for stroke/thromboembolism based upon her CHA2DS2-VASc Score of 4.  Continue Apixaban (Eliquis).   3. Obesity Body mass index is 38.35 kg/m. Lifestyle modification was discussed and encouraged including regular physical activity and weight reduction.  4. CAD No anginal symptoms.  5. HTN Elevated today, patient reports it is usually elevated at home as well. Offered referral to HTN clinic as she is already on multiple HTN meds. She declined for now. She has not tolerated higher dose of amlodipine due to swelling. Patient will keep BP log for review.   6. OSA Encouraged compliance with CPAP therapy.   Follow up with Dr Curt Bears as scheduled.    [facility] [address] [place], [place] [address] [phone] 11/26/2021 4:15 PM

## 2022-01-11 ENCOUNTER — Ambulatory Visit: Payer: Medicare Other | Attending: Cardiology | Admitting: Cardiology

## 2022-01-11 NOTE — Progress Notes (Deleted)
Electrophysiology Office Note   Date:  01/11/2022   ID:  Melissa Boyer, DOB July 02, 1955, MRN [id number]  PCP:  Cher Nakai, MD  Cardiologist:  [place] Primary Electrophysiologist:  Carey Lafon Meredith Leeds, MD    Chief Complaint: AF   History of Present Illness: Melissa Boyer is a 66 y.o. female who is being seen today for the evaluation of AF at the request of Cher Nakai, MD. Presenting today for electrophysiology evaluation.  She has a history significant for diabetes, hyperlipidemia, atrial fibrillation, sleep apnea, former tobacco abuse, nonobstructive coronary artery disease.  She was initially admitted to the hospital for dofetilide load but did not convert to sinus rhythm.  She was started on amiodarone.  She is now status post atrial fibrillation ablation 10/07/2021.  Today, denies symptoms of palpitations, chest pain, shortness of breath, orthopnea, PND, lower extremity edema, claudication, dizziness, presyncope, syncope, bleeding, or neurologic sequela. The patient is tolerating medications without difficulties. 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   Hypercholesteremia    Hypertension    Left renal mass 12/29/2019   Mixed hyperlipidemia 02/27/2019   Obesity (BMI 35.0-39.9 without comorbidity) 12/29/2019   Obstructive sleep apnea on CPAP 06/19/2019   Paroxysmal atrial fibrillation ([place]) 02/27/2019   Pure hypercholesterolemia 01/01/2014   Thyroid disease    hypothyroid   Unspecified hypothyroidism  01/01/2014   Past Surgical History:  Procedure Laterality Date   ATRIAL FIBRILLATION ABLATION N/A 10/07/2021   Procedure: ATRIAL FIBRILLATION ABLATION;  Surgeon: Constance Haw, MD;  Location: [place] [facility];  Service: Cardiovascular;  Laterality: N/A;   BUBBLE STUDY  10/16/2020   Procedure: BUBBLE STUDY;  Surgeon: Elouise Munroe, MD;  Location: [place];  Service: Cardiovascular;;   CARDIOVERSION N/A 10/16/2020   Procedure: CARDIOVERSION;  Surgeon: Elouise Munroe, MD;  Location: [facility] ENDOSCOPY;  Service: Cardiovascular;  Laterality: N/A;   CARDIOVERSION N/A 12/04/2020   Procedure: CARDIOVERSION;  Surgeon: Buford Dresser, MD;  Location: [place];  Service: Cardiovascular;  Laterality: N/A;   CARDIOVERSION N/A 01/14/2021   Procedure: CARDIOVERSION (CATH LAB);  Surgeon: Constance Haw, MD;  Location: [place] [facility];  Service: Cardiovascular;  Laterality: N/A;   COLPOSCOPY  10/98   normal, no biopsy   ENDOMETRIAL ABLATION  12/02/2002   ENDOMETRIAL BIOPSY  5/03   IR RADIOLOGIST EVAL & MGMT  11/05/2020   RIGHT/LEFT HEART CATH AND CORONARY ANGIOGRAPHY N/A 07/18/2020   Procedure: RIGHT/LEFT HEART CATH AND CORONARY ANGIOGRAPHY;  Surgeon: Jettie Booze, MD;  Location: [place] [facility];  Service: Cardiovascular;  Laterality: N/A;   TEE WITHOUT CARDIOVERSION N/A 10/16/2020   Procedure: TRANSESOPHAGEAL ECHOCARDIOGRAM (TEE);  Surgeon: Elouise Munroe, MD;  Location: [facility] ENDOSCOPY;  Service: Cardiovascular;  Laterality: N/A;   THYROIDECTOMY, PARTIAL     thyroid nodule excision left side     Current Outpatient Medications  Medication Sig Dispense Refill   acetaminophen (TYLENOL)  650 MG CR tablet Take 1,300 mg by mouth every 8 (eight) hours as needed for pain.     ALPRAZolam (XANAX) 0.5 MG tablet Take 0.25 mg by mouth daily as needed for anxiety.     amiodarone (PACERONE) 200 MG tablet TAKE 1 TABLET(200 MG) BY MOUTH DAILY 30 tablet 10   amLODipine (NORVASC) 5 MG  tablet Take 1 tablet (5 mg total) by mouth daily. (Patient taking differently: Take 2.5 mg by mouth daily.) 90 tablet 0   apixaban (ELIQUIS) 5 MG TABS tablet Take 1 tablet (5 mg total) by mouth 2 (two) times daily. 60 tablet 13   atorvastatin (LIPITOR) 20 MG tablet Take 20 mg by mouth every evening.     gabapentin (NEURONTIN) 100 MG capsule Take 100 mg by mouth 2 (two) times daily as needed (pain).     hydrochlorothiazide (HYDRODIURIL) 25 MG tablet Take 0.5 tablets (12.5 mg total) by mouth daily. 45 tablet 3   levothyroxine (SYNTHROID, LEVOTHROID) 125 MCG tablet Take 125 mcg by mouth daily before breakfast.     losartan (COZAAR) 100 MG tablet Take 100 mg by mouth daily.     metFORMIN (GLUCOPHAGE) 500 MG tablet Take 1 tablet (500 mg total) by mouth 2 (two) times daily with a meal. (Patient taking differently: Take 1,000 mg by mouth 2 (two) times daily with a meal.)     metoprolol succinate (TOPROL-XL) 100 MG 24 hr tablet Take 100 mg by mouth daily.     nitroGLYCERIN (NITROSTAT) 0.4 MG SL tablet Place 0.4 mg under the tongue every 5 (five) minutes as needed for chest pain.     triazolam (HALCION) 0.25 MG tablet Take 0.5 mg by mouth at bedtime.     No current facility-administered medications for this visit.    Allergies:   Tape   Social History:  The patient  reports that she quit smoking about 18 years ago. Her smoking use included cigarettes. She has a 30.00 pack-year smoking history. She has never used smokeless tobacco. She reports that she does not drink alcohol and does not use drugs.   Family History:  The patient's family history includes Cancer in her maternal aunt, mother, and sister; Diabetes in her maternal grandfather; Emphysema in her father; Heart attack in her maternal aunt and maternal grandmother; Hypertension in her father.   ROS:  Please see the history of present illness.   Otherwise, review of systems is positive for none.   All other systems are reviewed and negative.    PHYSICAL EXAM: VS:  LMP 12/02/2002 (Approximate)  , BMI There is no height or weight on file to calculate BMI. GEN: Well nourished, well developed, in no acute distress  HEENT: normal  Neck: no JVD, carotid bruits, or masses Cardiac: ***RRR; no murmurs, rubs, or gallops,no edema  Respiratory:  clear to auscultation bilaterally, normal work of breathing GI: soft, nontender, nondistended, + BS MS: no deformity or atrophy  Skin: warm and dry Neuro:  Strength and sensation are intact Psych: euthymic mood, full affect  EKG:  EKG {ACTION; IS/IS ALP:[id number]} ordered today. Personal review of the ekg ordered *** shows ***   Recent Labs: 09/29/2021: BUN 22; Creatinine, Ser 0.95; Hemoglobin 13.8; Platelets 231; Potassium 3.5; Sodium 138    Lipid Panel  No results found for: "CHOL", "TRIG", "HDL", "CHOLHDL", "VLDL", "LDLCALC", "LDLDIRECT"   Wt Readings from Last 3 Encounters:  11/26/21 241 lb 3.2 oz (109.4 kg)  10/07/21 235 lb (106.6 kg)  08/28/21 244 lb 6.4 oz (  110.9 kg)      Other studies Reviewed: Additional studies/ records that were reviewed today include: TTE 07/28/20  Review of the above records today demonstrates:   1. Left ventricular ejection fraction, by estimation, is 60 to 65%. The  left ventricle has normal function. The left ventricle has no regional  wall motion abnormalities. Left ventricular diastolic parameters are  indeterminate.   2. Right ventricular systolic function is normal. The right ventricular  size is normal. There is normal pulmonary artery systolic pressure.   3. Left atrial size was mild to moderately dilated.   4. The mitral valve is normal in structure. No evidence of mitral valve  regurgitation. No evidence of mitral stenosis.   5. The aortic valve is normal in structure. Aortic valve regurgitation is  not visualized. No aortic stenosis is present.   6. There is mild dilatation of the ascending aorta, measuring 38 mm.   7. The inferior vena  cava is normal in size with greater than 50%  respiratory variability, suggesting right atrial pressure of 3 mmHg.   Cardiac monitor 09/02/2020 personally reviewed Atrial Fibrillation occurred continuously (100% burden), ranging from 39-151 bpm (avg of 65 bpm).   Premature ventricular complexes were frequent (5.2%, [id number]). Ventricular Bigeminy and Trigeminy were present.   18 patient triggered events in 26 diary events were associated with the atrial fibrillation and premature ventricular complex.  RHC/LHC 07/18/20 Ost Cx lesion is 25% stenosed. Mid LAD lesion is 25% stenosed. The left ventricular systolic function is normal. LV end diastolic pressure is mildly elevated. The left ventricular ejection fraction is 55-65% by visual estimate. There is no aortic valve stenosis. Ao sat 99%, PA sat 73%, PA pressure 32/12, mean PA 25 mm Hg; mean PCWP 20 mm Hg; CO 5.1 L/min; CI 2.38  ASSESSMENT AND PLAN:  1.  Persistent atrial fibrillation: Wore a cardiac monitor with a 100% burden.  CHA2DS2-VASc of 3.  Currently on amiodarone 200 mg daily, Eliquis 5 mg twice daily.  Status post ablation 10/07/2021.***  2.  Obstructive sleep apnea: CPAP compliance encouraged  3.  Hypertension:***  4.  Obesity: Lifestyle modification encouraged There is no height or weight on file to calculate BMI.   Current medicines are reviewed at length with the patient today.   The patient does not have concerns regarding her medicines.  The following changes were made today: ***  Labs/ tests ordered today include:  No orders of the defined types were placed in this encounter.    Disposition:   FU with Emmali Karow *** months  Signed, Shjon Lizarraga Meredith Leeds, MD  01/11/2022 8:38 AM     [facility] [address] [place] [address] [phone] (office) [phone] (fax)

## 2022-01-26 NOTE — Progress Notes (Unsigned)
PCP:  Cher Nakai, MD Primary Cardiologist: Berniece Salines, DO Electrophysiologist: Will Meredith Leeds, MD   [name] Melissa Boyer is a 66 y.o. female seen [facility] for Will Meredith Leeds, MD for post hospital follow up.    Admitted in [place] earlier this month for A/C CHF.  Echo showed LVEF 30-35% (scanned), Grade III DD, RVSP 61 mmHg  Since discharge from hospital the patient reports doing ***.  she denies chest pain, palpitations, dyspnea, PND, orthopnea, nausea, vomiting, dizziness, syncope, edema, weight gain, or early satiety.   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   Hypercholesteremia    Hypertension    Left renal mass 12/29/2019   Mixed hyperlipidemia 02/27/2019   Obesity (BMI 35.0-39.9 without comorbidity) 12/29/2019   Obstructive sleep apnea on CPAP 06/19/2019   Paroxysmal atrial fibrillation ([place]) 02/27/2019   Pure hypercholesterolemia 01/01/2014   Thyroid disease    hypothyroid   Unspecified hypothyroidism 01/01/2014   Past Surgical History:  Procedure Laterality Date   ATRIAL FIBRILLATION ABLATION N/A 10/07/2021   Procedure: ATRIAL FIBRILLATION ABLATION;  Surgeon: Constance Haw, MD;  Location: [facility] [facility];  Service: Cardiovascular;  Laterality: N/A;   BUBBLE STUDY  10/16/2020   Procedure: BUBBLE STUDY;  Surgeon: Elouise Munroe, MD;  Location: [facility];  Service: Cardiovascular;;   CARDIOVERSION N/A 10/16/2020   Procedure: CARDIOVERSION;  Surgeon: Elouise Munroe, MD;  Location: [facility]  ENDOSCOPY;  Service: Cardiovascular;  Laterality: N/A;   CARDIOVERSION N/A 12/04/2020   Procedure: CARDIOVERSION;  Surgeon: Buford Dresser, MD;  Location: [place];  Service: Cardiovascular;  Laterality: N/A;   CARDIOVERSION N/A 01/14/2021   Procedure: CARDIOVERSION (CATH LAB);  Surgeon: Constance Haw, MD;  Location: [place] [facility];  Service: Cardiovascular;  Laterality: N/A;   COLPOSCOPY  10/98   normal, no biopsy   ENDOMETRIAL ABLATION  12/02/2002   ENDOMETRIAL BIOPSY  5/03   IR RADIOLOGIST EVAL & MGMT  11/05/2020   RIGHT/LEFT HEART CATH AND CORONARY ANGIOGRAPHY N/A 07/18/2020   Procedure: RIGHT/LEFT HEART CATH AND CORONARY ANGIOGRAPHY;  Surgeon: Jettie Booze, MD;  Location: [place] [facility];  Service: Cardiovascular;  Laterality: N/A;   TEE WITHOUT CARDIOVERSION N/A 10/16/2020   Procedure: TRANSESOPHAGEAL ECHOCARDIOGRAM (TEE);  Surgeon: Elouise Munroe, MD;  Location: [facility] ENDOSCOPY;  Service: Cardiovascular;  Laterality: N/A;   THYROIDECTOMY, PARTIAL     thyroid nodule excision left side    Current Outpatient Medications  Medication Sig Dispense Refill   acetaminophen (TYLENOL) 650 MG CR tablet Take 1,300 mg by mouth every 8 (eight) hours as needed for pain.     ALPRAZolam (XANAX) 0.5 MG tablet Take 0.25 mg by mouth daily as needed for anxiety.     amiodarone (PACERONE) 200 MG tablet TAKE 1 TABLET(200 MG) BY MOUTH DAILY 30 tablet 10   amLODipine (NORVASC) 5 MG tablet Take 1 tablet (5 mg total) by mouth daily. (Patient taking differently: Take 2.5 mg  by mouth daily.) 90 tablet 0   apixaban (ELIQUIS) 5 MG TABS tablet Take 1 tablet (5 mg total) by mouth 2 (two) times daily. 60 tablet 13   atorvastatin (LIPITOR) 20 MG tablet Take 20 mg by mouth every evening.     gabapentin (NEURONTIN) 100 MG capsule Take 100 mg by mouth 2 (two) times daily as needed (pain).     hydrochlorothiazide (HYDRODIURIL) 25 MG tablet Take 0.5 tablets (12.5 mg total) by mouth daily. 45  tablet 3   levothyroxine (SYNTHROID, LEVOTHROID) 125 MCG tablet Take 125 mcg by mouth daily before breakfast.     losartan (COZAAR) 100 MG tablet Take 100 mg by mouth daily.     metFORMIN (GLUCOPHAGE) 500 MG tablet Take 1 tablet (500 mg total) by mouth 2 (two) times daily with a meal. (Patient taking differently: Take 1,000 mg by mouth 2 (two) times daily with a meal.)     metoprolol succinate (TOPROL-XL) 100 MG 24 hr tablet Take 100 mg by mouth daily.     nitroGLYCERIN (NITROSTAT) 0.4 MG SL tablet Place 0.4 mg under the tongue every 5 (five) minutes as needed for chest pain.     triazolam (HALCION) 0.25 MG tablet Take 0.5 mg by mouth at bedtime.     No current facility-administered medications for this visit.    Allergies  Allergen Reactions   Tape     Adhesives  Paper tape takes off skin      Social History   Socioeconomic History   Marital status: Divorced    Spouse name: Not on file   Number of children: 1   Years of education: Not on file   Highest education level: Not on file  Occupational History   Not on file  Tobacco Use   Smoking status: Former    Packs/day: 1.00    Years: 30.00    Total pack years: 30.00    Types: Cigarettes    Quit date: 07/12/2003    Years since quitting: 18.5   Smokeless tobacco: Never  Vaping Use   Vaping Use: Never used  Substance and Sexual Activity   Alcohol use: Never   Drug use: No   Sexual activity: Yes    Partners: Male    Birth control/protection: Post-menopausal  Other Topics Concern   Not on file  Social History Narrative   Not on file   Social Determinants of Health   Financial Resource Strain: Not on file  Food Insecurity: Not on file  Transportation Needs: Not on file  Physical Activity: Not on file  Stress: Not on file  Social Connections: Not on file  Intimate Partner Violence: Not on file     Review of Systems: All other systems reviewed and are otherwise negative except as noted above.  Physical  Exam: There were no vitals filed for this visit.  GEN- The patient is well appearing, alert and oriented x 3 [facility].   HEENT: normocephalic, atraumatic; sclera clear, conjunctiva pink; hearing intact; oropharynx clear; neck supple, no JVP Lymph- no cervical lymphadenopathy Lungs- Clear to ausculation bilaterally, normal work of breathing.  No wheezes, rales, rhonchi Heart- {Blank single:19197::"Regular","Irregularly irregular"} rate and rhythm, no murmurs, rubs or gallops, PMI not laterally displaced GI- soft, non-tender, non-distended, bowel sounds present, no hepatosplenomegaly Extremities- {EDEMA YKZLD:35701} peripheral edema. no clubbing or cyanosis; DP/PT/radial pulses 2+ bilaterally MS- no significant deformity or atrophy Skin- warm and dry, no rash or lesion Psych- euthymic mood, full affect Neuro- strength and sensation are intact  EKG is not ordered. Personal review of EKG from  11/26/2021  shows sinus bradycardia at 52 bpm.   Additional studies reviewed include: Previous EP office notes.   Cath 07/18/2020 25% LAD and Ost Cx but otherwise normal arteries. Echo 07/28/2020 LVEF 60-65%  CT Coronaries 10/2021 Calcium score 77.1 ; 77th percentile; Insufficient for plaque evaluation  Assessment and Plan:  1. Persistent atrial fibrillation S/P ablation  10/07/2021   2. OSA  3. HTN Stable on current regimen   4. Obesity There is no height or weight on file to calculate BMI.  Encouraged lifestyle modification   5. Acute systolic CHF EF newly down in [place], some concern for Takotsubo cardiomyopathy.   Follow up with {EPMDS:28135} in {EPFOLLOW UP:28173}  Shirley Friar, PA-C  01/26/22 8:08 AM

## 2022-01-27 ENCOUNTER — Other Ambulatory Visit: Payer: Self-pay

## 2022-01-27 ENCOUNTER — Encounter: Payer: Self-pay | Admitting: Student

## 2022-01-27 ENCOUNTER — Ambulatory Visit: Payer: Medicare Other | Attending: Student | Admitting: Student

## 2022-01-27 VITALS — BP 128/78 | HR 51 | Ht 66.5 in | Wt 229.0 lb

## 2022-01-27 DIAGNOSIS — D6869 Other thrombophilia: Secondary | ICD-10-CM

## 2022-01-27 DIAGNOSIS — I2510 Atherosclerotic heart disease of native coronary artery without angina pectoris: Secondary | ICD-10-CM

## 2022-01-27 DIAGNOSIS — I5021 Acute systolic (congestive) heart failure: Secondary | ICD-10-CM

## 2022-01-27 DIAGNOSIS — I4819 Other persistent atrial fibrillation: Secondary | ICD-10-CM | POA: Diagnosis present

## 2022-01-27 MED ORDER — SPIRONOLACTONE 25 MG PO TABS: 25.0000 mg | ORAL_TABLET | Freq: Every day | ORAL | 3 refills | Status: DC

## 2022-01-27 NOTE — Patient Instructions (Signed)
Medication Instructions:  Your physician has recommended you make the following change in your medication:   START: Spironolactone '25mg'$  daily  *If you need a refill on your cardiac medications before your next appointment, please call your pharmacy*   Lab Work: TODAY: BMET Your physician recommends that you return for lab work in 2 weeks. You can go to our [place] office for this.   If you have labs (blood work) drawn today and your tests are completely normal, you will receive your results only by: [place] (if you have MyChart) OR A paper copy in the mail If you have any lab test that is abnormal or we need to change your treatment, we will call you to review the results.   Testing/Procedures: Cardiac MRI  Your physician has requested that you have an echocardiogram. Echocardiography is a painless test that uses sound waves to create images of your heart. It provides your doctor with information about the size and shape of your heart and how well your heart's chambers and valves are working. This procedure takes approximately one hour. There are no restrictions for this procedure.   Follow-Up: At [facility], you and your health needs are our priority.  As part of our continuing mission to provide you with exceptional heart care, we have created designated Provider Care Teams.  These Care Teams include your primary Cardiologist (physician) and Advanced Practice Providers (APPs -  Physician Assistants and Nurse Practitioners) who all work together to provide you with the care you need, when you need it.  Your next appointment:   Dr. Harriet Masson in 1 month Dr. Curt Bears in January  Other Instructions See letter for Cardiac MRI Instructions

## 2022-01-28 ENCOUNTER — Telehealth: Payer: Self-pay | Admitting: Cardiology

## 2022-01-28 LAB — BASIC METABOLIC PANEL
BUN/Creatinine Ratio: 18 (ref 12–28)
BUN: 22 mg/dL (ref 8–27)
CO2: 20 mmol/L (ref 20–29)
Calcium: 9.7 mg/dL (ref 8.7–10.3)
Chloride: 102 mmol/L (ref 96–106)
Creatinine, Ser: 1.23 mg/dL — ABNORMAL HIGH (ref 0.57–1.00)
Glucose: 141 mg/dL — ABNORMAL HIGH (ref 70–99)
Potassium: 4.7 mmol/L (ref 3.5–5.2)
Sodium: 138 mmol/L (ref 134–144)
eGFR: 48 mL/min/{1.73_m2} — ABNORMAL LOW (ref >59)

## 2022-01-28 NOTE — Telephone Encounter (Signed)
Pt is returning call in regards to labs. Requesting return call.  

## 2022-01-28 NOTE — Telephone Encounter (Signed)
Reviewed lab result orders with patient. She will start spironolactone today and discontinue potassium. BMET orders already in chart. [place] updated.

## 2022-01-29 ENCOUNTER — Telehealth: Payer: Self-pay | Admitting: Cardiology

## 2022-01-29 NOTE — Telephone Encounter (Signed)
Returned call to patient. Per OV note by Oda Kilts on 01/27/22, he wanted to get a cardiac MRI done to evaluate her new decrease in EF and rule out Takotsubo cardiomyopathy. Patient states he stressed that these tests be done as soon as possible, and pt states she cannot wait until December to do this test as "I just spent 2 weeks in the hospital and we need to know what's going on." Advised pt that I would route her message to our cardiac imaging schedulers at the hospital to see if it can be done any sooner. Patient states she is fine going to another location such as [facility] [place] [facility] or [facility] if needed for it to be done sooner.

## 2022-01-29 NOTE — Telephone Encounter (Signed)
Pt would like a callback regarding test that were ordered. Pt states that she was told that test needed to be done as soon as possible but is not able to have them done until December. Pt wants to know if that is okay or can she have scheduled at another location. Please advise

## 2022-02-04 ENCOUNTER — Telehealth: Payer: Self-pay

## 2022-02-04 ENCOUNTER — Ambulatory Visit (INDEPENDENT_AMBULATORY_CARE_PROVIDER_SITE_OTHER): Payer: Medicare Other

## 2022-02-04 ENCOUNTER — Encounter: Payer: Self-pay | Admitting: Cardiology

## 2022-02-04 ENCOUNTER — Ambulatory Visit: Payer: Medicare Other | Attending: Cardiology | Admitting: Cardiology

## 2022-02-04 VITALS — BP 144/86 | HR 53 | Ht 66.0 in | Wt 229.8 lb

## 2022-02-04 DIAGNOSIS — I480 Paroxysmal atrial fibrillation: Secondary | ICD-10-CM | POA: Diagnosis present

## 2022-02-04 DIAGNOSIS — Z79899 Other long term (current) drug therapy: Secondary | ICD-10-CM

## 2022-02-04 DIAGNOSIS — I10 Essential (primary) hypertension: Secondary | ICD-10-CM | POA: Diagnosis present

## 2022-02-04 DIAGNOSIS — R0989 Other specified symptoms and signs involving the circulatory and respiratory systems: Secondary | ICD-10-CM | POA: Insufficient documentation

## 2022-02-04 DIAGNOSIS — I2510 Atherosclerotic heart disease of native coronary artery without angina pectoris: Secondary | ICD-10-CM | POA: Diagnosis not present

## 2022-02-04 DIAGNOSIS — E669 Obesity, unspecified: Secondary | ICD-10-CM | POA: Diagnosis present

## 2022-02-04 DIAGNOSIS — E782 Mixed hyperlipidemia: Secondary | ICD-10-CM

## 2022-02-04 NOTE — Progress Notes (Signed)
Cardiology Office Note:    Date:  02/05/2022   ID:  Melissa Boyer, DOB 02-18-1956, MRN [id number]  PCP:  Marga Hoots, NP  Cardiologist:  Berniece Salines, DO  Electrophysiologist:  Constance Haw, MD   Referring MD: Carvel Getting Key, *     I am ok  History of Present Illness:    Melissa Boyer is a 66 y.o. female with a hx of diabetes mellitus, hyperlipidemia, atrial fibrillation on Eliquis which was diagnosed in 2019, sleep apnea on CPAP, hypothyroidism, former smoker, coronary artery disease nonobstructive per her recent cardiac catheterization.  Since I saw the patient she has been following with EP she has had her ablation.  She is seeing Dr. Curt Bears.  In the [facility].  Her EF noted on the echo there was 30 to 35%.  She had been started on LifeVest unclear if she had ventricular arrhythmia while she was there.  But she is currently is wearing the LifeVest. She is feeling better.  But her imaging for her cardiac MRI still pending which is scheduled for October 11. Recently she saw Barrington Ellison, PA from EP.  She is here with her daughter.  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   Hypercholesteremia    Hypertension    Left renal mass 12/29/2019   Mixed hyperlipidemia 02/27/2019   Obesity (BMI 35.0-39.9 without comorbidity) 12/29/2019   Obstructive sleep apnea on CPAP 06/19/2019   Paroxysmal atrial fibrillation  ([place]) 02/27/2019   Pure hypercholesterolemia 01/01/2014   Thyroid disease    hypothyroid   Unspecified hypothyroidism 01/01/2014    Past Surgical History:  Procedure Laterality Date   ATRIAL FIBRILLATION ABLATION N/A 10/07/2021   Procedure: ATRIAL FIBRILLATION ABLATION;  Surgeon: Constance Haw, MD;  Location: [place] [facility];  Service: Cardiovascular;  Laterality: N/A;   BUBBLE STUDY  10/16/2020   Procedure: BUBBLE STUDY;  Surgeon: Elouise Munroe, MD;  Location: [place];  Service: Cardiovascular;;   CARDIOVERSION N/A 10/16/2020   Procedure: CARDIOVERSION;  Surgeon: Elouise Munroe, MD;  Location: [facility] ENDOSCOPY;  Service: Cardiovascular;  Laterality: N/A;   CARDIOVERSION N/A 12/04/2020   Procedure: CARDIOVERSION;  Surgeon: Buford Dresser, MD;  Location: [place];  Service: Cardiovascular;  Laterality: N/A;   CARDIOVERSION N/A 01/14/2021   Procedure: CARDIOVERSION (CATH LAB);  Surgeon: Constance Haw, MD;  Location: [place] [facility];  Service: Cardiovascular;  Laterality: N/A;   COLPOSCOPY  10/98   normal, no biopsy   ENDOMETRIAL ABLATION  12/02/2002   ENDOMETRIAL BIOPSY  5/03   IR RADIOLOGIST EVAL & MGMT  11/05/2020   RIGHT/LEFT HEART CATH AND CORONARY ANGIOGRAPHY N/A 07/18/2020   Procedure: RIGHT/LEFT HEART CATH AND CORONARY ANGIOGRAPHY;  Surgeon: Jettie Booze, MD;  Location: [place] [facility];  Service: Cardiovascular;  Laterality: N/A;   TEE WITHOUT CARDIOVERSION N/A  10/16/2020   Procedure: TRANSESOPHAGEAL ECHOCARDIOGRAM (TEE);  Surgeon: Elouise Munroe, MD;  Location: [facility] ENDOSCOPY;  Service: Cardiovascular;  Laterality: N/A;   THYROIDECTOMY, PARTIAL     thyroid nodule excision left side    Current Medications: No outpatient medications have been marked as taking for the 02/04/22 encounter (Office Visit) with Berniece Salines, DO.     Allergies:   Tape   Social History   Socioeconomic History   Marital status: Divorced    Spouse name: Not on  file   Number of children: 1   Years of education: Not on file   Highest education level: Not on file  Occupational History   Not on file  Tobacco Use   Smoking status: Former    Packs/day: 1.00    Years: 30.00    Total pack years: 30.00    Types: Cigarettes    Quit date: 07/12/2003    Years since quitting: 18.5   Smokeless tobacco: Never  Vaping Use   Vaping Use: Never used  Substance and Sexual Activity   Alcohol use: Never   Drug use: No   Sexual activity: Yes    Partners: Male    Birth control/protection: Post-menopausal  Other Topics Concern   Not on file  Social History Narrative   Not on file   Social Determinants of Health   Financial Resource Strain: Not on file  Food Insecurity: Not on file  Transportation Needs: Not on file  Physical Activity: Not on file  Stress: Not on file  Social Connections: Not on file     Family History: The patient's family history includes Cancer in her maternal aunt, mother, and sister; Diabetes in her maternal grandfather; Emphysema in her father; Heart attack in her maternal aunt and maternal grandmother; Hypertension in her father.  ROS:   Review of Systems  Constitution: Negative for decreased appetite, fever and weight gain.  HENT: Negative for congestion, ear discharge, hoarse voice and sore throat.   Eyes: Negative for discharge, redness, vision loss in right eye and visual halos.  Cardiovascular: Negative for chest pain, dyspnea on exertion, leg swelling, orthopnea and palpitations.  Respiratory: Negative for cough, hemoptysis, shortness of breath and snoring.   Endocrine: Negative for heat intolerance and polyphagia.  Hematologic/Lymphatic: Negative for bleeding problem. Does not bruise/bleed easily.  Skin: Negative for flushing, nail changes, rash and suspicious lesions.  Musculoskeletal: Negative for arthritis, joint pain, muscle cramps, myalgias, neck pain and stiffness.  Gastrointestinal: Negative for abdominal  pain, bowel incontinence, diarrhea and excessive appetite.  Genitourinary: Negative for decreased libido, genital sores and incomplete emptying.  Neurological: Negative for brief paralysis, focal weakness, headaches and loss of balance.  Psychiatric/Behavioral: Negative for altered mental status, depression and suicidal ideas.  Allergic/Immunologic: Negative for HIV exposure and persistent infections.    EKGs/Labs/Other Studies Reviewed:    The following studies were reviewed today:   EKG:  The ekg ordered today demonstrates   Recent Labs: 09/29/2021: Hemoglobin 13.8; Platelets 231 02/04/2022: BUN 17; Creatinine, Ser 1.01; Magnesium 2.1; Potassium 4.1; Sodium 141  Recent Lipid Panel No results found for: "CHOL", "TRIG", "HDL", "CHOLHDL", "VLDL", "LDLCALC", "LDLDIRECT"  Physical Exam:    VS:  BP (!) 144/86 (BP Location: Left Arm, Patient Position: Sitting)   Pulse (!) 53   Ht '5\' 6"'$  (1.676 m)   Wt 229 lb 12.8 oz (104.2 kg)   LMP 12/02/2002 (Approximate)   SpO2 97%   BMI 37.09 kg/m     Wt Readings from Last  3 Encounters:  02/04/22 229 lb 12.8 oz (104.2 kg)  01/27/22 229 lb (103.9 kg)  11/26/21 241 lb 3.2 oz (109.4 kg)     GEN: Well nourished, well developed in no acute distress HEENT: Normal NECK: No JVD; No carotid bruits LYMPHATICS: No lymphadenopathy CARDIAC: S1S2 noted,RRR, no murmurs, rubs, gallops RESPIRATORY:  Clear to auscultation without rales, wheezing or rhonchi  ABDOMEN: Soft, non-tender, non-distended, +bowel sounds, no guarding. EXTREMITIES: No edema, No cyanosis, no clubbing MUSCULOSKELETAL:  No deformity  SKIN: Warm and dry NEUROLOGIC:  Alert and oriented x 3, non-focal PSYCHIATRIC:  Normal affect, good insight  ASSESSMENT:    1. PAF (paroxysmal atrial fibrillation) ([place])   2. Medication management   3. Essential hypertension   4. Mild CAD   5. Mixed hyperlipidemia   6. Obesity (BMI 35.0-39.9 without comorbidity)   7. Depressed left ventricular  ejection fraction    PLAN:    She currently is on Entresto, Coreg as well as Aldactone.  I will keep her on her medication regimen for now.  We will get blood work to get her kidney function.  I will like to ideally increase the Entresto if I am able to based on kidney function as well.  We will talk in depth about treatment plan.  Of asked her to stay on the current regimen.  It is unclear if she had ventricular arrhythmia noted while in the hospital.  Ideally if she did not she really should be on the zoll vest.  For now she will stay on her abdomen and place a monitor the patient understands the make sure she is not going to any malignant arrhythmias.  In the meantime we will wait for her MRI result as well to understand if there is any increasing EF.  Should she have some slight increasing EF the plan will be to take off the ZOLL vest.  Paroxysmal atrial fibrillation-continue current medication for this.   The patient is in agreement with the above plan. The patient left the office in stable condition.  The patient will follow up in 4 months   Medication Adjustments/Labs and Tests Ordered: Current medicines are reviewed at length with the patient today.  Concerns regarding medicines are outlined above.  Orders Placed This Encounter  Procedures   Basic Metabolic Panel (BMET)   Magnesium   LONG TERM MONITOR (3-14 DAYS)   No orders of the defined types were placed in this encounter.   Patient Instructions  Medication Instructions:  Your physician recommends that you continue on your current medications as directed. Please refer to the Current Medication list given to you today.  *If you need a refill on your cardiac medications before your next appointment, please call your pharmacy*   Lab Work: TODAY: BMET, Mag If you have labs (blood work) drawn today and your tests are completely normal, you will receive your results only by: [place] (if you have MyChart) OR A paper  copy in the mail If you have any lab test that is abnormal or we need to change your treatment, we will call you to review the results.   Testing/Procedures: [place]n [place]- Long Term Monitor Instructions  Your physician has requested you wear a ZIO patch monitor for 14 days.  This is a single patch monitor. [employer] supplies one patch monitor per enrollment. Additional stickers are not available. Please do not apply patch if you will be having a Nuclear Stress Test,  Echocardiogram, Cardiac CT, MRI, or Chest Xray during  the period you would be wearing the  monitor. The patch cannot be worn during these tests. You cannot remove and re-apply the  ZIO XT patch monitor.  Your ZIO patch monitor will be mailed 3 day USPS to your address on file. It may take 3-5 days  to receive your monitor after you have been enrolled.  Once you have received your monitor, please review the enclosed instructions. Your monitor  has already been registered assigning a specific monitor serial # to you.  Billing and Patient Assistance Program Information  We have supplied [employer] with any of your insurance information on file for billing purposes. [employer] offers a sliding scale Patient Assistance Program for patients that do not have  insurance, or whose insurance does not completely cover the cost of the ZIO monitor.  You must apply for the Patient Assistance Program to qualify for this discounted rate.  To apply, please call [employer] at [phone], select option 4, select option 2, ask to apply for  Patient Assistance Program. Theodore Demark will ask your household income, and how many people  are in your household. They will quote your out-of-pocket cost based on that information.  [employer] will also be able to set up a 51-month interest-free payment plan if needed.  Applying the monitor   Shave hair from upper left chest.  Hold abrader disc by orange tab. Rub abrader in 40 strokes over the upper left chest as   indicated in your monitor instructions.  Clean area with 4 enclosed alcohol pads. Let dry.  Apply patch as indicated in monitor instructions. Patch will be placed under collarbone on left  side of chest with arrow pointing upward.  Rub patch adhesive wings for 2 minutes. Remove white label marked "1". Remove the white  label marked "2". Rub patch adhesive wings for 2 additional minutes.  While looking in a mirror, press and release button in center of patch. A small green light will  flash 3-4 times. This will be your only indicator that the monitor has been turned on.  Do not shower for the first 24 hours. You may shower after the first 24 hours.  Press the button if you feel a symptom. You will hear a small click. Record Date, Time and  Symptom in the Patient Logbook.  When you are ready to remove the patch, follow instructions on the last 2 pages of Patient  Logbook. Stick patch monitor onto the last page of Patient Logbook.  Place Patient Logbook in the blue and white box. Use locking tab on box and tape box closed  securely. The blue and white box has prepaid postage on it. Please place it in the mailbox as  soon as possible. Your physician should have your test results approximately 7 days after the  monitor has been mailed [facility] to [facility]  Call [facility]at [phone] you have questions regarding  your ZIO XT patch monitor. Call them immediately if you see an orange light blinking on your  monitor.  If your monitor falls off in less than 4 days, contact our Monitor department at [phone]  If your monitor becomes loose or falls off after 4 days call [employer] at [phone]  suggestions on securing your monitor    Follow-Up: [facility] you and your health needs are our priority.  As part of our continuing mission to provide you with exceptional heart care, we have created designated Provider Care Teams.  These Care Teams  include your primary Cardiologist (physician) and Advanced Practice Providers (APPs -  Physician Assistants and Nurse Practitioners) who all work together to provide you with the care you need, when you need it.  We recommend signing up for the patient portal called "MyChart".  Sign up information is provided on this After Visit Summary.  MyChart is used to connect with patients for Virtual Visits (Telemedicine).  Patients are able to view lab/test results, encounter notes, upcoming appointments, etc.  Non-urgent messages can be sent to your provider as well.   To learn more about what you can do with MyChart, go to [email or web address].    Your next appointment:   4 month(s)  The format for your next appointment:   In Person  Provider:   Berniece Salines, DO     Other Instructions   Important Information About Sugar         Adopting a Healthy Lifestyle.  Know what a healthy weight is for you (roughly BMI <25) and aim to maintain this   Aim for 7+ servings of fruits and vegetables daily   65-80+ fluid ounces of water or unsweet tea for healthy kidneys   Limit to max 1 drink of alcohol per day; avoid smoking/tobacco   Limit animal fats in diet for cholesterol and heart health - choose grass fed whenever available   Avoid highly processed foods, and foods high in saturated/trans fats   Aim for low stress - take time to unwind and care for your mental health   Aim for 150 min of moderate intensity exercise weekly for heart health, and weights twice weekly for bone health   Aim for 7-9 hours of sleep daily   When it comes to diets, agreement about the perfect plan isnt easy to find, even among the experts. Experts at the [employer] developed an idea known as the Healthy Eating Plate. Just imagine a plate divided into logical, healthy portions.   The emphasis is on diet quality:   Load up on vegetables and fruits - one-half of your plate: Aim for color  and variety, and remember that potatoes dont count.   Go for whole grains - one-quarter of your plate: Whole wheat, barley, wheat berries, quinoa, oats, brown rice, and foods made with them. If you want pasta, go with whole wheat pasta.   Protein power - one-quarter of your plate: Fish, chicken, beans, and nuts are all healthy, versatile protein sources. Limit red meat.   The diet, however, does go beyond the plate, offering a few other suggestions.   Use healthy plant oils, such as olive, canola, soy, corn, sunflower and peanut. Check the labels, and avoid partially hydrogenated oil, which have unhealthy trans fats.   If youre thirsty, drink water. Coffee and tea are good in moderation, but skip sugary drinks and limit milk and dairy products to one or two daily servings.   The type of carbohydrate in the diet is more important than the amount. Some sources of carbohydrates, such as vegetables, fruits, whole grains, and beans-are healthier than others.   Finally, stay active  Signed, Berniece Salines, DO  02/05/2022 9:54 PM    [facility]

## 2022-02-04 NOTE — Progress Notes (Unsigned)
Enrolled for [employer] to mail a ZIO XT long term holter monitor to the patients address on file.  

## 2022-02-04 NOTE — Telephone Encounter (Signed)
Called pt to see if he can come in sooner. No answer, left message.

## 2022-02-04 NOTE — Patient Instructions (Signed)
Medication Instructions:  Your physician recommends that you continue on your current medications as directed. Please refer to the Current Medication list given to you today.  *If you need a refill on your cardiac medications before your next appointment, please call your pharmacy*   Lab Work: TODAY: BMET, Mag If you have labs (blood work) drawn today and your tests are completely normal, you will receive your results only by: [place] (if you have MyChart) OR A paper copy in the mail If you have any lab test that is abnormal or we need to change your treatment, we will call you to review the results.   Testing/Procedures: Bryn Gulling- Long Term Monitor Instructions  Your physician has requested you wear a ZIO patch monitor for 14 days.  This is a single patch monitor. [employer] supplies one patch monitor per enrollment. Additional stickers are not available. Please do not apply patch if you will be having a Nuclear Stress Test,  Echocardiogram, Cardiac CT, MRI, or Chest Xray during the period you would be wearing the  monitor. The patch cannot be worn during these tests. You cannot remove and re-apply the  ZIO XT patch monitor.  Your ZIO patch monitor will be mailed 3 day USPS to your address on file. It may take 3-5 days  to receive your monitor after you have been enrolled.  Once you have received your monitor, please review the enclosed instructions. Your monitor  has already been registered assigning a specific monitor serial # to you.  Billing and Patient Assistance Program Information  We have supplied [employer] with any of your insurance information on file for billing purposes. [employer] offers a sliding scale Patient Assistance Program for patients that do not have  insurance, or whose insurance does not completely cover the cost of the ZIO monitor.  You must apply for the Patient Assistance Program to qualify for this discounted rate.  To apply, please call [employer] at  [phone], select option 4, select option 2, ask to apply for  Patient Assistance Program. Theodore Demark will ask your household income, and how many people  are in your household. They will quote your out-of-pocket cost based on that information.  [employer] will also be able to set up a 30-month interest-free payment plan if needed.  Applying the monitor   Shave hair from upper left chest.  Hold abrader disc by orange tab. Rub abrader in 40 strokes over the upper left chest as  indicated in your monitor instructions.  Clean area with 4 enclosed alcohol pads. Let dry.  Apply patch as indicated in monitor instructions. Patch will be placed under collarbone on left  side of chest with arrow pointing upward.  Rub patch adhesive wings for 2 minutes. Remove white label marked "1". Remove the white  label marked "2". Rub patch adhesive wings for 2 additional minutes.  While looking in a mirror, press and release button in center of patch. A small green light will  flash 3-4 times. This will be your only indicator that the monitor has been turned on.  Do not shower for the first 24 hours. You may shower after the first 24 hours.  Press the button if you feel a symptom. You will hear a small click. Record Date, Time and  Symptom in the Patient Logbook.  When you are ready to remove the patch, follow instructions on the last 2 pages of Patient  Logbook. Stick patch monitor onto the last page of Patient Logbook.  Place Patient  Logbook in the blue and white box. Use locking tab on box and tape box closed  securely. The blue and white box has prepaid postage on it. Please place it in the mailbox as  soon as possible. Your physician should have your test results approximately 7 days after the  monitor has been mailed back to [facility].  Call [place] at [phone] if you have questions regarding  your ZIO XT patch monitor. Call them immediately if you see an orange light  blinking on your  monitor.  If your monitor falls off in less than 4 days, contact our Monitor department at [phone].  If your monitor becomes loose or falls off after 4 days call [employer] at [phone] for  suggestions on securing your monitor    Follow-Up: At [facility], you and your health needs are our priority.  As part of our continuing mission to provide you with exceptional heart care, we have created designated Provider Care Teams.  These Care Teams include your primary Cardiologist (physician) and Advanced Practice Providers (APPs -  Physician Assistants and Nurse Practitioners) who all work together to provide you with the care you need, when you need it.  We recommend signing up for the patient portal called "MyChart".  Sign up information is provided on this After Visit Summary.  MyChart is used to connect with patients for Virtual Visits (Telemedicine).  Patients are able to view lab/test results, encounter notes, upcoming appointments, etc.  Non-urgent messages can be sent to your provider as well.   To learn more about what you can do with MyChart, go to [email or web address].    Your next appointment:   4 month(s)  The format for your next appointment:   In Person  Provider:   Berniece Salines, DO     Other Instructions   Important Information About Sugar

## 2022-02-05 ENCOUNTER — Other Ambulatory Visit: Payer: Self-pay | Admitting: Cardiology

## 2022-02-05 ENCOUNTER — Telehealth: Payer: Self-pay | Admitting: Cardiology

## 2022-02-05 ENCOUNTER — Other Ambulatory Visit: Payer: Self-pay

## 2022-02-05 DIAGNOSIS — F419 Anxiety disorder, unspecified: Secondary | ICD-10-CM

## 2022-02-05 DIAGNOSIS — R0989 Other specified symptoms and signs involving the circulatory and respiratory systems: Secondary | ICD-10-CM | POA: Insufficient documentation

## 2022-02-05 DIAGNOSIS — Z79899 Other long term (current) drug therapy: Secondary | ICD-10-CM | POA: Insufficient documentation

## 2022-02-05 DIAGNOSIS — I480 Paroxysmal atrial fibrillation: Secondary | ICD-10-CM | POA: Insufficient documentation

## 2022-02-05 LAB — BASIC METABOLIC PANEL
BUN/Creatinine Ratio: 17 (ref 12–28)
BUN: 17 mg/dL (ref 8–27)
CO2: 22 mmol/L (ref 20–29)
Calcium: 9.8 mg/dL (ref 8.7–10.3)
Chloride: 100 mmol/L (ref 96–106)
Creatinine, Ser: 1.01 mg/dL — ABNORMAL HIGH (ref 0.57–1.00)
Glucose: 123 mg/dL — ABNORMAL HIGH (ref 70–99)
Potassium: 4.1 mmol/L (ref 3.5–5.2)
Sodium: 141 mmol/L (ref 134–144)
eGFR: 61 mL/min/{1.73_m2} (ref >59)

## 2022-02-05 LAB — MAGNESIUM: Magnesium: 2.1 mg/dL (ref 1.6–2.3)

## 2022-02-05 MED ORDER — MAGNESIUM OXIDE -MG SUPPLEMENT 400 (240 MG) MG PO TABS: 1.0000 | ORAL_TABLET | Freq: Two times a day (BID) | ORAL | 3 refills | Status: DC

## 2022-02-05 MED ORDER — CARVEDILOL 3.125 MG PO TABS: 3.1250 mg | ORAL_TABLET | Freq: Two times a day (BID) | ORAL | 3 refills | Status: DC

## 2022-02-05 MED ORDER — ENTRESTO 24-26 MG PO TABS: 1.0000 | ORAL_TABLET | Freq: Two times a day (BID) | ORAL | 1 refills | Status: DC

## 2022-02-05 MED ORDER — FUROSEMIDE 40 MG PO TABS: 40.0000 mg | ORAL_TABLET | Freq: Every day | ORAL | 1 refills | Status: DC

## 2022-02-05 NOTE — Telephone Encounter (Signed)
Medications have been filled

## 2022-02-05 NOTE — Telephone Encounter (Signed)
*  STAT* If patient is at the pharmacy, call can be transferred to refill team.   1. Which medications need to be refilled? (please list name of each medication and dose if known)  furosemide (LASIX) 40 MG tablet ENTRESTO 24-26 MG carvedilol (COREG) 3.125 MG tablet magnesium oxide (MAG-OX) 400 (240 Mg) MG tablet   2. Which pharmacy/location (including street and city if local pharmacy) is medication to be sent to? [facility] [phone] - [place], [address] AT [place]  3. Do they need a 30 day or 90 day supply? 30 day

## 2022-02-08 DIAGNOSIS — I4892 Unspecified atrial flutter: Secondary | ICD-10-CM

## 2022-02-08 MED ORDER — DIAZEPAM 5 MG PO TABS: 5.0000 mg | ORAL_TABLET | Freq: Once | ORAL | 0 refills | Status: DC | PRN

## 2022-02-09 ENCOUNTER — Encounter: Payer: Self-pay | Admitting: Cardiology

## 2022-02-09 ENCOUNTER — Telehealth (HOSPITAL_COMMUNITY): Payer: Self-pay | Admitting: Emergency Medicine

## 2022-02-09 ENCOUNTER — Other Ambulatory Visit: Payer: Self-pay | Admitting: *Deleted

## 2022-02-09 MED ORDER — ENTRESTO 49-51 MG PO TABS: 1.0000 | ORAL_TABLET | Freq: Two times a day (BID) | ORAL | 3 refills | Status: DC

## 2022-02-09 NOTE — Telephone Encounter (Signed)
Reaching out to patient to offer assistance regarding upcoming cardiac imaging study; pt verbalizes understanding of appt date/time, parking situation and where to check in, pre-test NPO status and medications ordered, and verified current allergies; name and call back number provided for further questions should they arise Marchia Bond RN Navigator Cardiac Imaging [facility] [phone] office [phone] cell  Difficult IV Arrival [facility] '5mg'$  valium 1 h prior Holding lasix , spironolactone  Pt wearing zio patch heart monitor, placed 2 days ago, prescribed to wear 2 weeks.

## 2022-02-10 ENCOUNTER — Ambulatory Visit
Admission: RE | Admit: 2022-02-10 | Discharge: 2022-02-10 | Disposition: A | Discharge status: Home / Self Care | Payer: Medicare Other | Source: Ambulatory Visit | Attending: Student | Admitting: Student

## 2022-02-10 ENCOUNTER — Telehealth (HOSPITAL_COMMUNITY): Payer: Self-pay | Admitting: Emergency Medicine

## 2022-02-10 ENCOUNTER — Other Ambulatory Visit: Payer: Self-pay | Admitting: Student

## 2022-02-10 ENCOUNTER — Other Ambulatory Visit: Payer: Medicare Other

## 2022-02-10 DIAGNOSIS — I5021 Acute systolic (congestive) heart failure: Secondary | ICD-10-CM

## 2022-02-10 DIAGNOSIS — I4819 Other persistent atrial fibrillation: Secondary | ICD-10-CM

## 2022-02-10 MED ORDER — GADOBUTROL 1 MMOL/ML IV SOLN
13.0000 mL | Freq: Once | INTRAVENOUS | Status: AC | PRN
  Administered 2022-02-10: 13 mL via INTRAVENOUS

## 2022-02-10 NOTE — Telephone Encounter (Signed)
Called to instruct patient to remove ZIO patch today before CMR appt, mail back to [employer] and she will receive a replacement which she will wear for the entire 2 wk prescribed time.  Marchia Bond RN Navigator Cardiac Imaging [facility] [phone] Office  [phone] Cell

## 2022-02-11 ENCOUNTER — Encounter: Payer: Self-pay | Admitting: Cardiology

## 2022-02-15 NOTE — Telephone Encounter (Signed)
Patient has been scheduled to discuss results.

## 2022-02-15 NOTE — Telephone Encounter (Signed)
Sent to Dr Harriet Masson.

## 2022-02-16 ENCOUNTER — Telehealth: Payer: Self-pay | Admitting: Cardiology

## 2022-02-16 NOTE — Telephone Encounter (Signed)
Pt this Via Mychart to the scheduling pool:    Is there any possibility we can do this video call on Thursday instead? I need my daughter to be able to be with me for the call.  Anytime on Thursday will be fine. Also,  do I still have to go to the [place] office and have blood work done this week?  I had blood work done at my last visit with Dr Harriet Masson and I've forgotten now if that replaced the [place] appointment.

## 2022-02-16 NOTE — Telephone Encounter (Signed)
Patient would like to keep the video visit with Dr. Harriet Masson but wants to know if she can have it on Wednesday or Thursday so her daughter can be present as well. She has a lot of questions that she would like to discuss with Dr. Harriet Masson.

## 2022-02-19 ENCOUNTER — Encounter: Payer: Self-pay | Admitting: Cardiology

## 2022-02-19 ENCOUNTER — Ambulatory Visit: Payer: Medicare Other | Attending: Cardiology | Admitting: Cardiology

## 2022-02-19 VITALS — BP 133/88 | HR 83 | Ht 66.5 in | Wt 223.0 lb

## 2022-02-19 DIAGNOSIS — I110 Hypertensive heart disease with heart failure: Secondary | ICD-10-CM | POA: Diagnosis not present

## 2022-02-19 DIAGNOSIS — I10 Essential (primary) hypertension: Secondary | ICD-10-CM | POA: Diagnosis not present

## 2022-02-19 DIAGNOSIS — I480 Paroxysmal atrial fibrillation: Secondary | ICD-10-CM | POA: Insufficient documentation

## 2022-02-19 DIAGNOSIS — I5042 Chronic combined systolic (congestive) and diastolic (congestive) heart failure: Secondary | ICD-10-CM | POA: Diagnosis present

## 2022-02-19 DIAGNOSIS — I2510 Atherosclerotic heart disease of native coronary artery without angina pectoris: Secondary | ICD-10-CM | POA: Diagnosis not present

## 2022-02-19 DIAGNOSIS — R0989 Other specified symptoms and signs involving the circulatory and respiratory systems: Secondary | ICD-10-CM | POA: Diagnosis not present

## 2022-02-19 NOTE — Progress Notes (Signed)
Virtual Visit via Video Note   Because of Melissa Boyer's co-morbid illnesses, she is at least at moderate risk for complications without adequate follow up.  This format is felt to be most appropriate for this patient at this time.  All issues noted in this document were discussed and addressed.  A limited physical exam was performed with this format.  Please refer to the patient's chart for her consent to telehealth for [facility].      Date:  02/19/2022   ID:  Melissa Boyer, DOB 1955-12-23, MRN [id number]  Patient Location: Home Provider Location: Office/Clinic  2 identifiers use to correctly identify the patient prior to the visit.  PCP:  Marga Hoots, NP  Cardiologist:  Berniece Salines, DO  Electrophysiologist:  Constance Haw, MD   Evaluation Performed:  Follow-Up Visit  Chief Complaint:  " I just have a few questions"  History of Present Illness:    Melissa Boyer is a 66 y.o. female with  hx of diabetes mellitus, hyperlipidemia, atrial fibrillation on Eliquis which was diagnosed in 2019 status post ablation,  sleep apnea on CPAP, hypothyroidism, former smoker, coronary artery disease nonobstructive per her recent cardiac catheterization, recent depresssed EF 30-35% which has now recovered with EF58% on cardiac MR.   She has a few questions post cardiac MR prompting this    Since I saw the patient she has been following with EP she has had her ablation.  She is seeing Dr. Curt Bears.  [facility] the [facility].  Her EF noted on the echo there was 30 to 35%.  She had been started on LifeVest unclear if she had ventricular arrhythmia while she was there.  But she is currently is wearing the LifeVest. She is feeling better.  But her imaging for her cardiac MRI still pending which is scheduled for October 11.  The patient does not have symptoms concerning for COVID-19 infection (fever,  chills, cough, or new shortness of breath).    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   Hypercholesteremia    Hypertension    Left renal mass 12/29/2019   Mixed hyperlipidemia 02/27/2019   Obesity (BMI 35.0-39.9 without comorbidity) 12/29/2019   Obstructive sleep apnea on CPAP 06/19/2019   Paroxysmal atrial fibrillation ([place]) 02/27/2019   Pure hypercholesterolemia 01/01/2014   Thyroid disease    hypothyroid   Unspecified hypothyroidism 01/01/2014   Past Surgical History:  Procedure Laterality Date   ATRIAL FIBRILLATION ABLATION N/A 10/07/2021   Procedure: ATRIAL FIBRILLATION ABLATION;  Surgeon: Constance Haw, MD;  Location: [place] [facility];  Service: Cardiovascular;  Laterality: N/A;   BUBBLE STUDY  10/16/2020   Procedure: BUBBLE STUDY;  Surgeon: Elouise Munroe, MD;  Location: [place];  Service: Cardiovascular;;   CARDIOVERSION N/A 10/16/2020   Procedure: CARDIOVERSION;  Surgeon: Cherlynn Kaiser  A, MD;  Location: [place];  Service: Cardiovascular;  Laterality: N/A;   CARDIOVERSION N/A 12/04/2020   Procedure: CARDIOVERSION;  Surgeon: Buford Dresser, MD;  Location: [place];  Service: Cardiovascular;  Laterality: N/A;   CARDIOVERSION N/A 01/14/2021   Procedure: CARDIOVERSION (CATH LAB);  Surgeon: Constance Haw, MD;  Location: [place] [facility];  Service: Cardiovascular;  Laterality: N/A;   COLPOSCOPY  10/98   normal, no biopsy   ENDOMETRIAL ABLATION  12/02/2002   ENDOMETRIAL BIOPSY   5/03   IR RADIOLOGIST EVAL & MGMT  11/05/2020   RIGHT/LEFT HEART CATH AND CORONARY ANGIOGRAPHY N/A 07/18/2020   Procedure: RIGHT/LEFT HEART CATH AND CORONARY ANGIOGRAPHY;  Surgeon: Jettie Booze, MD;  Location: [place] [facility];  Service: Cardiovascular;  Laterality: N/A;   TEE WITHOUT CARDIOVERSION N/A 10/16/2020   Procedure: TRANSESOPHAGEAL ECHOCARDIOGRAM (TEE);  Surgeon: Elouise Munroe, MD;  Location: [facility] ENDOSCOPY;  Service: Cardiovascular;  Laterality: N/A;   THYROIDECTOMY, PARTIAL     thyroid nodule excision left side     Current Meds  Medication Sig   acetaminophen (TYLENOL) 650 MG CR tablet Take 1,300 mg by mouth every 8 (eight) hours as needed for pain.   apixaban (ELIQUIS) 5 MG TABS tablet Take 1 tablet (5 mg total) by mouth 2 (two) times daily.   ASPIRIN LOW DOSE 81 MG tablet Take 81 mg by mouth at bedtime.   atorvastatin (LIPITOR) 20 MG tablet Take 20 mg by mouth every evening.   carvedilol (COREG) 3.125 MG tablet Take 1 tablet (3.125 mg total) by mouth 2 (two) times daily.   furosemide (LASIX) 40 MG tablet TAKE 1 TABLET(40 MG) BY MOUTH DAILY   levothyroxine (SYNTHROID, LEVOTHROID) 125 MCG tablet Take 125 mcg by mouth daily before breakfast.   magnesium oxide (MAG-OX) 400 (240 Mg) MG tablet Take 1 tablet (400 mg total) by mouth 2 (two) times daily.   nitroGLYCERIN (NITROSTAT) 0.4 MG SL tablet Place 0.4 mg under the tongue every 5 (five) minutes as needed for chest pain.   sacubitril-valsartan (ENTRESTO) 49-51 MG Take 1 tablet by mouth 2 (two) times daily.   spironolactone (ALDACTONE) 25 MG tablet Take 1 tablet (25 mg total) by mouth daily.   triazolam (HALCION) 0.25 MG tablet Take 0.5 mg by mouth at bedtime.     Allergies:   Tape   Social History   Tobacco Use   Smoking status: Former    Packs/day: 1.00    Years: 30.00    Total pack years: 30.00    Types: Cigarettes    Quit date: 07/12/2003    Years since quitting: 18.6   Smokeless tobacco: Never  Vaping Use    Vaping Use: Never used  Substance Use Topics   Alcohol use: Never   Drug use: No     Family Hx: The patient's family history includes Cancer in her maternal aunt, mother, and sister; Diabetes in her maternal grandfather; Emphysema in her father; Heart attack in her maternal aunt and maternal grandmother; Hypertension in her father.  ROS:   Please see the history of present illness.     All other systems reviewed and are negative.   Prior CV studies:   The following studies were reviewed today:  Cardiac MR 02/10/2022 IMPRESSION: 1.  Normal LV size and function, LVEF 58%.   2.  No evidence for scar or myocardial infiltration.   3.  Normal RV size and function   4.  Mild left atrial enlargement   5.  Mild mitral regurgitation.     Electronically Signed   By: Kate Sable M.D.   On: 02/11/2022 17:01  Labs/Other Tests and Data Reviewed:    EKG:  No ECG reviewed.  Recent Labs: 09/29/2021: Hemoglobin 13.8; Platelets 231 02/04/2022: BUN 17; Creatinine, Ser 1.01; Magnesium 2.1; Potassium 4.1; Sodium 141   Recent Lipid Panel No results found for: "CHOL", "TRIG", "HDL", "CHOLHDL", "LDLCALC", "LDLDIRECT"  Wt Readings from Last 3 Encounters:  02/19/22 223 lb (101.2 kg)  02/04/22 229 lb 12.8 oz (104.2 kg)  01/27/22 229 lb (103.9 kg)     Objective:    Vital Signs:  BP 133/88   Pulse 83   Ht 5' 6.5" (1.689 m)   Wt 223 lb (101.2 kg)   LMP 12/02/2002 (Approximate)   BMI 35.45 kg/m      ASSESSMENT & PLAN:    We discussed her MRI results showing recovery of her ejection fraction.  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  I explained to the patient what this means. She had questions about her medications she is currently on Entresto she was inquiring about likely use of losartan due to cost.  We talked about these medications for now she prefers to stay on Entresto. She is going to get back to  work on Monday.  We will send a note for the patient that she can get back to work-she would like to have special accommodations for limiting the stairs I do not think this is unreasonable.  We will put this in her note as well.  COVID-19 Education: The signs and symptoms of COVID-19 were discussed with the patient and how to seek [facility] for testing (follow up with PCP or arrange E-visit).  The importance of social distancing was discussed today.  Time:   Today, I have spent 12 minutes with the patient with telehealth technology discussing the above problems.     Medication Adjustments/Labs and Tests Ordered: Current medicines are reviewed at length with the patient today.  Concerns regarding medicines are outlined above.   Tests Ordered: No orders of the defined types were placed in this encounter.   Medication Changes: No orders of the defined types were placed in this encounter.   Follow Up:  In Person in 6 month(s)  Signed, Berniece Salines, DO  02/19/2022 9:06 PM    [facility]

## 2022-02-19 NOTE — Patient Instructions (Signed)
Medication Instructions:  Your physician recommends that you continue on your current medications as directed. Please refer to the Current Medication list given to you today.  *If you need a refill on your cardiac medications before your next appointment, please call your pharmacy*   Lab Work: NONE If you have labs (blood work) drawn today and your tests are completely normal, you will receive your results only by: MyChart Message (if you have MyChart) OR A paper copy in the mail If you have any lab test that is abnormal or we need to change your treatment, we will call you to review the results.   Testing/Procedures: NONE   Follow-Up: At [facility], you and your health needs are our priority.  As part of our continuing mission to provide you with exceptional heart care, we have created designated Provider Care Teams.  These Care Teams include your primary Cardiologist (physician) and Advanced Practice Providers (APPs -  Physician Assistants and Nurse Practitioners) who all work together to provide you with the care you need, when you need it.  We recommend signing up for the patient portal called "MyChart".  Sign up information is provided on this After Visit Summary.  MyChart is used to connect with patients for Virtual Visits (Telemedicine).  Patients are able to view lab/test results, encounter notes, upcoming appointments, etc.  Non-urgent messages can be sent to your provider as well.   To learn more about what you can do with MyChart, go to [email or web address].    Your next appointment:   6 month(s)  The format for your next appointment:   In Person  Provider:   Kardie Tobb, DO   

## 2022-02-26 ENCOUNTER — Encounter: Payer: Self-pay | Admitting: Cardiology

## 2022-03-02 ENCOUNTER — Other Ambulatory Visit: Payer: Self-pay | Admitting: Cardiology

## 2022-04-06 ENCOUNTER — Ambulatory Visit: Payer: Medicare Other | Attending: Cardiology | Admitting: Cardiology

## 2022-04-06 ENCOUNTER — Encounter: Payer: Self-pay | Admitting: Cardiology

## 2022-04-06 VITALS — HR 61 | Ht 67.0 in | Wt 220.0 lb

## 2022-04-06 DIAGNOSIS — I4892 Unspecified atrial flutter: Secondary | ICD-10-CM

## 2022-04-06 MED ORDER — ENTRESTO 49-51 MG PO TABS: 1.0000 | ORAL_TABLET | Freq: Two times a day (BID) | ORAL | 3 refills | Status: DC

## 2022-04-06 MED ORDER — FUROSEMIDE 40 MG PO TABS: 40.0000 mg | ORAL_TABLET | Freq: Every day | ORAL | 3 refills | Status: DC

## 2022-04-06 NOTE — Patient Instructions (Signed)
Medication Instructions:  Your physician recommends that you continue on your current medications as directed. Please refer to the Current Medication list given to you today.  *If you need a refill on your cardiac medications before your next appointment, please call your pharmacy*   Lab Work: NONE If you have labs (blood work) drawn today and your tests are completely normal, you will receive your results only by: MyChart Message (if you have MyChart) OR A paper copy in the mail If you have any lab test that is abnormal or we need to change your treatment, we will call you to review the results.   Testing/Procedures: NONE   Follow-Up: At [facility], you and your health needs are our priority.  As part of our continuing mission to provide you with exceptional heart care, we have created designated Provider Care Teams.  These Care Teams include your primary Cardiologist (physician) and Advanced Practice Providers (APPs -  Physician Assistants and Nurse Practitioners) who all work together to provide you with the care you need, when you need it.  We recommend signing up for the patient portal called "MyChart".  Sign up information is provided on this After Visit Summary.  MyChart is used to connect with patients for Virtual Visits (Telemedicine).  Patients are able to view lab/test results, encounter notes, upcoming appointments, etc.  Non-urgent messages can be sent to your provider as well.   To learn more about what you can do with MyChart, go to [email or web address].    Your next appointment:   6 month(s)  The format for your next appointment:   In Person  Provider:   Kardie Tobb, DO   

## 2022-04-06 NOTE — Progress Notes (Signed)
Virtual Visit via Video Note   Because of [name]'s co-morbid illnesses, she is at least at moderate risk for complications without adequate follow up.  This format is felt to be most appropriate for this patient at this time.  All issues noted in this document were discussed and addressed.  A limited physical exam was performed with this format.  Please refer to the patient's chart for her consent to telehealth for [facility].    Date:  04/06/2022   ID:  Melissa Boyer Jul 28, 1955, MRN [id number]  Patient Location: Home Provider Location: Office/Clinic  PCP:  Marga Hoots, NP  Cardiologist:  Berniece Salines, DO  Electrophysiologist:  Constance Haw, MD   Evaluation Performed:  Follow-Up Visit  Chief Complaint:  " I am fine"  History of Present Illness:    Melissa Boyer is a 66 y.o. female with  hx of diabetes mellitus, hyperlipidemia, atrial fibrillation on Eliquis which was diagnosed in 2019 status post ablation,  sleep apnea on CPAP, hypothyroidism, former smoker, coronary artery disease nonobstructive per her recent cardiac catheterization, recent depresssed EF 30-35% which has now recovered with EF58% on cardiac MR.    I recently saw the patient February 19, 2022 via we discussed her cardiac MRI result.  All of her questions during that time answered.  Prior to that she had been placed on ZIO monitor due to his palpitations.  She is here today to discuss her ZIO monitor results.  The patient does not  have symptoms concerning for COVID-19 infection (fever, chills, cough, or new shortness of breath).    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   Hypercholesteremia    Hypertension    Left renal mass 12/29/2019   Mixed hyperlipidemia 02/27/2019   Obesity (BMI 35.0-39.9 without comorbidity) 12/29/2019   Obstructive sleep apnea on CPAP 06/19/2019   Paroxysmal atrial fibrillation ([place]) 02/27/2019   Pure hypercholesterolemia 01/01/2014   Thyroid disease    hypothyroid   Unspecified hypothyroidism 01/01/2014   Past Surgical History:  Procedure Laterality Date   ATRIAL FIBRILLATION ABLATION N/A 10/07/2021   Procedure: ATRIAL FIBRILLATION ABLATION;  Surgeon: Constance Haw, MD;  Location: [place] [facility];  Service: Cardiovascular;  Laterality: N/A;   BUBBLE STUDY  10/16/2020   Procedure: BUBBLE STUDY;  Surgeon: Elouise Munroe, MD;  Location: [place];  Service: Cardiovascular;;   CARDIOVERSION N/A 10/16/2020   Procedure: CARDIOVERSION;  Surgeon: Elouise Munroe, MD;  Location: [facility] ENDOSCOPY;  Service: Cardiovascular;  Laterality: N/A;   CARDIOVERSION N/A 12/04/2020   Procedure: CARDIOVERSION;  Surgeon: Buford Dresser, MD;  Location: [place];  Service: Cardiovascular;  Laterality: N/A;   CARDIOVERSION N/A 01/14/2021   Procedure: CARDIOVERSION (CATH LAB);  Surgeon: Constance Haw, MD;  Location: [place] [facility];  Service: Cardiovascular;  Laterality: N/A;   COLPOSCOPY  10/98   normal, no biopsy   ENDOMETRIAL ABLATION  12/02/2002  ENDOMETRIAL BIOPSY  5/03   IR RADIOLOGIST EVAL & MGMT  11/05/2020   RIGHT/LEFT HEART CATH AND CORONARY ANGIOGRAPHY N/A 07/18/2020   Procedure: RIGHT/LEFT HEART CATH AND CORONARY ANGIOGRAPHY;  Surgeon: Jettie Booze, MD;  Location: [place] CV LAB;  Service: Cardiovascular;  Laterality: N/A;   TEE WITHOUT CARDIOVERSION N/A 10/16/2020   Procedure: TRANSESOPHAGEAL ECHOCARDIOGRAM (TEE);  Surgeon:  Elouise Munroe, MD;  Location: [facility] ENDOSCOPY;  Service: Cardiovascular;  Laterality: N/A;   THYROIDECTOMY, PARTIAL     thyroid nodule excision left side     Current Meds  Medication Sig   acetaminophen (TYLENOL) 650 MG CR tablet Take 1,300 mg by mouth every 8 (eight) hours as needed for pain.   apixaban (ELIQUIS) 5 MG TABS tablet Take 1 tablet (5 mg total) by mouth 2 (two) times daily.   ASPIRIN LOW DOSE 81 MG tablet Take 81 mg by mouth at bedtime.   atorvastatin (LIPITOR) 20 MG tablet Take 20 mg by mouth every evening.   carvedilol (COREG) 3.125 MG tablet Take 1 tablet (3.125 mg total) by mouth 2 (two) times daily.   clonazePAM (KLONOPIN) 0.5 MG tablet Take 0.5 mg by mouth 2 (two) times daily as needed.   levothyroxine (SYNTHROID, LEVOTHROID) 125 MCG tablet Take 125 mcg by mouth daily before breakfast.   magnesium oxide (MAG-OX) 400 (240 Mg) MG tablet Take 1 tablet (400 mg total) by mouth 2 (two) times daily.   nitroGLYCERIN (NITROSTAT) 0.4 MG SL tablet Place 0.4 mg under the tongue every 5 (five) minutes as needed for chest pain.   spironolactone (ALDACTONE) 25 MG tablet Take 1 tablet (25 mg total) by mouth daily.   triazolam (HALCION) 0.25 MG tablet Take 0.5 mg by mouth at bedtime.   [DISCONTINUED] furosemide (LASIX) 40 MG tablet Take 1 tablet (40 mg total) by mouth daily. Keep scheduled appointment   [DISCONTINUED] sacubitril-valsartan (ENTRESTO) 49-51 MG Take 1 tablet by mouth 2 (two) times daily.     Allergies:   Tape   Social History   Tobacco Use   Smoking status: Former    Packs/day: 1.00    Years: 30.00    Total pack years: 30.00    Types: Cigarettes    Quit date: 07/12/2003    Years since quitting: 18.7   Smokeless tobacco: Never  Vaping Use   Vaping Use: Never used  Substance Use Topics   Alcohol use: Never   Drug use: No     Family Hx: The patient's family history includes Cancer in her maternal aunt, mother, and sister; Diabetes in her maternal grandfather;  Emphysema in her father; Heart attack in her maternal aunt and maternal grandmother; Hypertension in her father.  ROS:   Please see the history of present illness.     All other systems reviewed and are negative.   Prior CV studies:   The following studies were reviewed today:  Zio monitor 03/15/2022 Patch Wear Time:  16 days and 2 hours (2023-10-09T06:16:03-399 to 2023-10-29T17:24:17-0400) Monitor 1 Atrial Flutter occurred continuously (100% burden), ranging from 48-88 bpm (avg of 69 bpm). Atrial Flutter may be possible Atrial Tachycardia with variable block. Isolated VEs were rare (<1.0%), and no VE Couplets or VE Triplets were present. Monitor 2 Atrial Flutter occurred continuously (100% burden), ranging from 42-128 bpm (avg of 68 bpm). Isolated VEs were rare (<1.0%), and no VE Couplets or VE Triplets were present.  Conclusion: There is evidence of continuous atrial flutter.  Labs/Other Tests and Data Reviewed:  EKG:  No ECG reviewed.  Recent Labs: 09/29/2021: Hemoglobin 13.8; Platelets 231 02/04/2022: BUN 17; Creatinine, Ser 1.01; Magnesium 2.1; Potassium 4.1; Sodium 141   Recent Lipid Panel No results found for: "CHOL", "TRIG", "HDL", "CHOLHDL", "LDLCALC", "LDLDIRECT"  Wt Readings from Last 3 Encounters:  04/06/22 220 lb (99.8 kg)  02/19/22 223 lb (101.2 kg)  02/04/22 229 lb 12.8 oz (104.2 kg)     Objective:    Vital Signs:  Pulse 61   Ht '5\' 7"'$  (1.702 m)   Wt 220 lb (99.8 kg)   LMP 12/02/2002 (Approximate)   BMI 34.46 kg/m     ASSESSMENT & PLAN:    Atrial flutter -continuous atrial flutter on the monitor.  She is status post ablation.  She had previously been taken off antiarrhythmics.  We discussed and at this time she will see EP in January 2 have further conversations.  She is asymptomatic.  She has no questions at this time.  We will continue her Eliquis 5 mg twice daily along with her carvedilol. Continue her current medical regimen.  COVID-19  Education: The signs and symptoms of COVID-19 were discussed with the patient and how to seek care for testing (follow up with PCP or arrange E-visit).  The importance of social distancing was discussed today.  Time:   Today, I have spent 15 minutes with the patient with telehealth technology discussing the above problems.     Medication Adjustments/Labs and Tests Ordered: Current medicines are reviewed at length with the patient today.  Concerns regarding medicines are outlined above.   Tests Ordered: No orders of the defined types were placed in this encounter.   Medication Changes: Meds ordered this encounter  Medications   sacubitril-valsartan (ENTRESTO) 49-51 MG    Sig: Take 1 tablet by mouth 2 (two) times daily.    Dispense:  180 tablet    Refill:  3    Dose increase   furosemide (LASIX) 40 MG tablet    Sig: Take 1 tablet (40 mg total) by mouth daily.    Dispense:  90 tablet    Refill:  3    **Patient requests 90 days supply**    Follow Up:  In Person in 6 week(s)  Signed, Berniece Salines, DO  04/06/2022 1:41 PM     [facility]

## 2022-04-19 ENCOUNTER — Other Ambulatory Visit: Payer: Medicare Other

## 2022-04-21 ENCOUNTER — Other Ambulatory Visit (HOSPITAL_COMMUNITY): Payer: Medicare Other

## 2022-04-22 ENCOUNTER — Other Ambulatory Visit: Payer: Self-pay

## 2022-04-22 ENCOUNTER — Emergency Department (HOSPITAL_COMMUNITY)
Admission: EM | Admit: 2022-04-22 | Discharge: 2022-04-23 | Discharge status: Against Medical Advice | Payer: Medicare Other | Attending: Emergency Medicine | Admitting: Emergency Medicine

## 2022-04-22 ENCOUNTER — Encounter (HOSPITAL_COMMUNITY): Payer: Self-pay

## 2022-04-22 ENCOUNTER — Emergency Department (HOSPITAL_COMMUNITY): Payer: Medicare Other

## 2022-04-22 DIAGNOSIS — Z5321 Procedure and treatment not carried out due to patient leaving prior to being seen by health care provider: Secondary | ICD-10-CM | POA: Insufficient documentation

## 2022-04-22 DIAGNOSIS — R059 Cough, unspecified: Secondary | ICD-10-CM | POA: Insufficient documentation

## 2022-04-22 DIAGNOSIS — Z7901 Long term (current) use of anticoagulants: Secondary | ICD-10-CM | POA: Insufficient documentation

## 2022-04-22 DIAGNOSIS — I509 Heart failure, unspecified: Secondary | ICD-10-CM | POA: Diagnosis not present

## 2022-04-22 DIAGNOSIS — R0602 Shortness of breath: Secondary | ICD-10-CM | POA: Insufficient documentation

## 2022-04-22 DIAGNOSIS — Z1152 Encounter for screening for COVID-19: Secondary | ICD-10-CM | POA: Insufficient documentation

## 2022-04-22 LAB — BASIC METABOLIC PANEL
Anion gap: 7 (ref 5–15)
BUN: 31 mg/dL — ABNORMAL HIGH (ref 8–23)
CO2: 21 mmol/L — ABNORMAL LOW (ref 22–32)
Calcium: 9.1 mg/dL (ref 8.9–10.3)
Chloride: 107 mmol/L (ref 98–111)
Creatinine, Ser: 1.43 mg/dL — ABNORMAL HIGH (ref 0.44–1.00)
GFR, Estimated: 40 mL/min — ABNORMAL LOW (ref >60)
Glucose, Bld: 122 mg/dL — ABNORMAL HIGH (ref 70–99)
Potassium: 4.5 mmol/L (ref 3.5–5.1)
Sodium: 135 mmol/L (ref 135–145)

## 2022-04-22 LAB — RESP PANEL BY RT-PCR (RSV, FLU A&B, COVID)  RVPGX2
Influenza A by PCR: POSITIVE — AB
Influenza B by PCR: NEGATIVE
Resp Syncytial Virus by PCR: NEGATIVE
SARS Coronavirus 2 by RT PCR: NEGATIVE

## 2022-04-22 LAB — CBC
HCT: 38.2 % (ref 36.0–46.0)
Hemoglobin: 13.2 g/dL (ref 12.0–15.0)
MCH: 31.7 pg (ref 26.0–34.0)
MCHC: 34.6 g/dL (ref 30.0–36.0)
MCV: 91.6 fL (ref 80.0–100.0)
Platelets: 158 10*3/uL (ref 150–400)
RBC: 4.17 MIL/uL (ref 3.87–5.11)
RDW: 13.5 % (ref 11.5–15.5)
WBC: 7 10*3/uL (ref 4.0–10.5)
nRBC: 0 % (ref 0.0–0.2)

## 2022-04-22 LAB — TROPONIN I (HIGH SENSITIVITY): Troponin I (High Sensitivity): 6 ng/L (ref <18)

## 2022-04-22 LAB — BRAIN NATRIURETIC PEPTIDE: B Natriuretic Peptide: 12.8 pg/mL (ref 0.0–100.0)

## 2022-04-22 MED ORDER — IPRATROPIUM-ALBUTEROL 0.5-2.5 (3) MG/3ML IN SOLN
3.0000 mL | Freq: Once | RESPIRATORY_TRACT | Status: AC
  Administered 2022-04-22: 3 mL via RESPIRATORY_TRACT
  Filled 2022-04-22: qty 3

## 2022-04-22 NOTE — ED Provider Triage Note (Signed)
  Emergency Medicine Provider Triage Evaluation Note  MRN:  [id number]  Arrival date & time: 04/22/22    Medically screening exam initiated at 10:06 PM.   CC:   SOB  HPI:  Melissa Boyer is a 66 y.o. year-old female presents to the ED with chief complaint of cough, SOB.  Hx of CHF.  States she has been having cold symptoms.  Has been taking Augmentin.  Takes Eliquis.  History provided by patient. ROS:  -As included in HPI PE:   Vitals:   04/22/22 2201  BP: 112/76  Pulse: 76  Resp: 17  Temp: 98.2 F (36.8 C)  SpO2: 100%    Non-toxic appearing No respiratory distress Sounds congested MDM:  Based on signs and symptoms, CHF exacerbation is highest on my differential. I've ordered labs and imaging in triage to expedite lab/diagnostic workup.  Patient was informed that the remainder of the evaluation will be completed by another provider, this initial triage assessment does not replace that evaluation, and the importance of remaining in the ED until their evaluation is complete.    Montine Circle, PA-C 04/22/22 2208

## 2022-04-22 NOTE — ED Triage Notes (Addendum)
Pt has been sick for past few days, was seen at [facility], was sent to ED for "fluid around her heart". Pt c/o shortness of breath. Pt being treated with tessalon perles and amoxicillin from PCP visit a few days ago.

## 2022-04-23 DIAGNOSIS — R059 Cough, unspecified: Secondary | ICD-10-CM | POA: Diagnosis not present

## 2022-04-23 LAB — TROPONIN I (HIGH SENSITIVITY): Troponin I (High Sensitivity): 6 ng/L (ref <18)

## 2022-04-23 NOTE — ED Notes (Signed)
Pt was called x3 no answer

## 2022-05-10 ENCOUNTER — Ambulatory Visit: Payer: PPO | Attending: Cardiology | Admitting: Cardiology

## 2022-05-10 ENCOUNTER — Encounter: Payer: Self-pay | Admitting: Cardiology

## 2022-05-10 VITALS — BP 118/68 | HR 66 | Ht 66.0 in | Wt 228.0 lb

## 2022-05-10 DIAGNOSIS — D6869 Other thrombophilia: Secondary | ICD-10-CM

## 2022-05-10 DIAGNOSIS — I4819 Other persistent atrial fibrillation: Secondary | ICD-10-CM | POA: Diagnosis not present

## 2022-05-10 NOTE — Progress Notes (Signed)
Electrophysiology Office Note   Date:  05/10/2022   ID:  Melissa Boyer, MRN [id number]  PCP:  Marga Hoots, NP  Cardiologist:  Harriet Masson Primary Electrophysiologist:  Arriel Victor Meredith Leeds, MD    Chief Complaint: AF   History of Present Illness: Melissa Boyer is a 67 y.o. female who is being seen today for the evaluation of AF at the request of Carvel Getting Key, *. Presenting today for electrophysiology evaluation.  She has a history sniffer diabetes, hyperlipidemia, atrial fibrillation, sleep apnea,, nonobstructive coronary artery disease.  She previously wore a cardiac monitor with a 100% burden of atrial fibrillation.  She had been loaded on dofetilide but went back into atrial fibrillation.  She was started on amiodarone.  She is now status post ablation 10/07/2021.  During ablation, she had multiple atrial flutter circuits with atrial tachycardia.  Since her ablation she has had a few further episodes of atrial fibrillation requiring cardioversion x 1.  She was sick in the hospital after Labor Day and did go back into atrial fibrillation and atrial flutter.  Since then, she has done well.  She has not had shortness of breath or fatigue.  She is remained in sinus rhythm.  Today, denies symptoms of palpitations, chest pain, shortness of breath, orthopnea, PND, lower extremity edema, claudication, dizziness, presyncope, syncope, bleeding, or neurologic sequela. The patient is tolerating medications without difficulties.    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   Hypercholesteremia    Hypertension    Left renal mass 12/29/2019   Mixed hyperlipidemia 02/27/2019   Obesity (BMI 35.0-39.9 without comorbidity) 12/29/2019   Obstructive sleep apnea on CPAP 06/19/2019   Paroxysmal atrial fibrillation ([place]) 02/27/2019   Pure hypercholesterolemia 01/01/2014   Thyroid disease    hypothyroid   Unspecified hypothyroidism 01/01/2014   Past Surgical History:  Procedure Laterality Date   ATRIAL FIBRILLATION ABLATION N/A 10/07/2021   Procedure: ATRIAL FIBRILLATION ABLATION;  Surgeon: Constance Haw, MD;  Location: [place] [facility];  Service: Cardiovascular;  Laterality: N/A;   BUBBLE STUDY  10/16/2020   Procedure: BUBBLE STUDY;  Surgeon: Elouise Munroe, MD;  Location: [place];  Service: Cardiovascular;;   CARDIOVERSION N/A 10/16/2020   Procedure: CARDIOVERSION;  Surgeon: Elouise Munroe, MD;  Location: [facility] ENDOSCOPY;  Service: Cardiovascular;  Laterality: N/A;   CARDIOVERSION N/A 12/04/2020   Procedure: CARDIOVERSION;  Surgeon: Buford Dresser, MD;  Location: [place];  Service: Cardiovascular;  Laterality: N/A;   CARDIOVERSION N/A 01/14/2021   Procedure: CARDIOVERSION (CATH LAB);  Surgeon: Constance Haw, MD;  Location: [place] [facility];  Service: Cardiovascular;  Laterality: N/A;   COLPOSCOPY  10/98   normal, no biopsy   ENDOMETRIAL ABLATION  12/02/2002   ENDOMETRIAL BIOPSY  5/03   IR RADIOLOGIST EVAL & MGMT  11/05/2020   RIGHT/LEFT HEART CATH AND CORONARY ANGIOGRAPHY N/A 07/18/2020   Procedure: RIGHT/LEFT HEART CATH AND CORONARY ANGIOGRAPHY;  Surgeon: Jettie Booze, MD;  Location: [place] [facility];  Service: Cardiovascular;  Laterality: N/A;   TEE WITHOUT CARDIOVERSION N/A 10/16/2020   Procedure: TRANSESOPHAGEAL ECHOCARDIOGRAM (TEE);  Surgeon: Elouise Munroe, MD;  [facility] ENDOSCOPY;  Service: Cardiovascular;  Laterality: N/A;   THYROIDECTOMY,  PARTIAL     thyroid nodule excision left side     Current Outpatient Medications  Medication Sig Dispense Refill   acetaminophen (TYLENOL) 650 MG CR tablet Take 1,300 mg by mouth every 8 (eight) hours as needed for pain.     apixaban (ELIQUIS) 5 MG TABS tablet Take 1 tablet (5 mg total) by mouth 2 (two) times daily. 60 tablet 13   ASPIRIN LOW DOSE 81 MG tablet Take 81 mg by mouth at bedtime.     atorvastatin (LIPITOR) 20 MG tablet Take 20 mg by mouth every evening.     carvedilol (COREG) 3.125 MG tablet Take 1 tablet (3.125 mg total) by mouth 2 (two) times daily. 90 tablet 3   clonazePAM (KLONOPIN) 0.5 MG tablet Take 0.5 mg by mouth 2 (two) times daily as needed.     furosemide (LASIX) 40 MG tablet Take 1 tablet (40 mg total) by mouth daily. 90 tablet 3   levothyroxine (SYNTHROID, LEVOTHROID) 125 MCG tablet Take 125 mcg by mouth daily before breakfast.     magnesium oxide (MAG-OX) 400 (240 Mg) MG tablet Take 1 tablet (400 mg total) by mouth 2 (two) times daily. 90 tablet 3   nitroGLYCERIN (NITROSTAT) 0.4 MG SL tablet Place 0.4 mg under the tongue every 5 (five) minutes as needed for chest pain.     sacubitril-valsartan (ENTRESTO) 49-51 MG Take 1 tablet by mouth 2 (two) times daily. 180 tablet 3   spironolactone (ALDACTONE) 25 MG tablet Take 1 tablet (25 mg total) by mouth daily. 90 tablet 3   triazolam (HALCION) 0.25 MG tablet Take 0.5 mg by mouth at bedtime.     No current facility-administered medications for this visit.    Allergies:   Tape   Social History:  The patient  reports that she quit smoking about 18 years ago. Her smoking use included cigarettes. She has a 30.00 pack-year smoking history. She has never used smokeless tobacco. She reports that she does not drink alcohol and does not use drugs.   Family History:  The patient's family history includes Cancer in her maternal aunt, mother, and sister; Diabetes in her maternal grandfather; Emphysema in her father; Heart attack in  her maternal aunt and maternal grandmother; Hypertension in her father.   ROS:  Please see the history of present illness.   Otherwise, review of systems is positive for none.   All other systems are reviewed and negative.   PHYSICAL EXAM: VS:  BP 118/68   Pulse 66   Ht '5\' 6"'$  (1.676 m)   Wt 228 lb (103.4 kg)   LMP 12/02/2002 (Approximate)   SpO2 97%   BMI 36.80 kg/m  , BMI Body mass index is 36.8 kg/m. GEN: Well nourished, well developed, in no acute distress  HEENT: normal  Neck: no JVD, carotid bruits, or masses Cardiac: RRR; no murmurs, rubs, or gallops,no edema  Respiratory:  clear to auscultation bilaterally, normal work of breathing GI: soft, nontender, nondistended, + BS MS: no deformity or atrophy  Skin: warm and dry Neuro:  Strength and sensation are intact Psych: euthymic mood, full affect  EKG:  EKG is ordered today. Personal review of the ekg ordered shows sinus rhythm  Recent Labs: 02/04/2022: Magnesium 2.1 04/22/2022: B Natriuretic Peptide 12.8; BUN 31; Creatinine, Ser 1.43; Hemoglobin 13.2; Platelets 158; Potassium 4.5; Sodium 135    Lipid Panel  No results found for: "CHOL", "TRIG", "HDL", "CHOLHDL", "VLDL", "LDLCALC", "LDLDIRECT"   Wt Readings from Last 3 Encounters:  05/10/22 228 lb (103.4 kg)  04/22/22 220 lb (99.8 kg)  04/06/22 220 lb (99.8 kg)      Other studies Reviewed: Additional studies/ records that were reviewed today include: CMRI 02/10/22  Review of the above records today demonstrates:  1.  Normal LV size and function, LVEF 58%.   2.  No evidence for scar or myocardial infiltration.   3.  Normal RV size and function   4.  Mild left atrial enlargement   5.  Mild mitral regurgitation.  RHC/LHC 07/18/20 Ost Cx lesion is 25% stenosed. Mid LAD lesion is 25% stenosed. The left ventricular systolic function is normal. LV end diastolic pressure is mildly elevated. The left ventricular ejection fraction is 55-65% by visual  estimate. There is no aortic valve stenosis. Ao sat 99%, PA sat 73%, PA pressure 32/12, mean PA 25 mm Hg; mean PCWP 20 mm Hg; CO 5.1 L/min; CI 2.38  ASSESSMENT AND PLAN:  1.  Persistent atrial fibrillation: CHA2DS2-VASc of 3.  Currently on Eliquis 5 mg twice daily.  Is now status post ablation 10/07/2021.  During ablation she had multiple atrial flutter circuits.  She has had minimal atrial fibrillation and flutter since her ablation.  Happy with her control.  No changes.  2.  Obstructive sleep apnea: CPAP compliance encouraged  3.  Hypertension: Currently well-controlled  4.  Obesity: Diet and exercise encouraged  5.  Secondary hypercoagulable state: Currently on Eliquis 5 mg twice daily for atrial fibrillation as above.    Current medicines are reviewed at length with the patient today.   The patient does not have concerns regarding her medicines.  The following changes were made today: None  Labs/ tests ordered today include:  No orders of the defined types were placed in this encounter.    Disposition:   FU with Dvid Pendry 3 months  Signed, Helayne Metsker Meredith Leeds, MD  05/10/2022 4:41 PM     [address] [place] [address] [phone] (office) [phone] (fax)

## 2022-05-10 NOTE — Patient Instructions (Signed)
Medication Instructions:  Your physician recommends that you continue on your current medications as directed. Please refer to the Current Medication list given to you today.  *If you need a refill on your cardiac medications before your next appointment, please call your pharmacy*   Lab Work: None ordered   Testing/Procedures: None ordered   Follow-Up: At [facility], you and your health needs are our priority.  As part of our continuing mission to provide you with exceptional heart care, we have created designated Provider Care Teams.  These Care Teams include your primary Cardiologist (physician) and Advanced Practice Providers (APPs -  Physician Assistants and Nurse Practitioners) who all work together to provide you with the care you need, when you need it.  Your next appointment:   6 month(s)  The format for your next appointment:   In Person  Provider:   Will Camnitz, MD    Thank you for choosing [facility]!!   Alexus Galka, RN [phone]  Other Instructions   Important Information About Sugar           

## 2022-05-12 NOTE — Addendum Note (Signed)
Addended by: Anselm Pancoast on: 05/12/2022 08:12 AM   Modules accepted: Orders

## 2022-05-31 DIAGNOSIS — Z1231 Encounter for screening mammogram for malignant neoplasm of breast: Secondary | ICD-10-CM | POA: Diagnosis not present

## 2022-06-18 DIAGNOSIS — M791 Myalgia, unspecified site: Secondary | ICD-10-CM | POA: Diagnosis not present

## 2022-06-18 DIAGNOSIS — J0100 Acute maxillary sinusitis, unspecified: Secondary | ICD-10-CM | POA: Diagnosis not present

## 2022-06-18 DIAGNOSIS — R051 Acute cough: Secondary | ICD-10-CM | POA: Diagnosis not present

## 2022-06-22 ENCOUNTER — Ambulatory Visit: Payer: Self-pay | Admitting: Obstetrics and Gynecology

## 2022-07-07 ENCOUNTER — Ambulatory Visit: Payer: Self-pay | Admitting: Obstetrics and Gynecology

## 2022-07-20 ENCOUNTER — Ambulatory Visit: Payer: Self-pay | Admitting: Obstetrics and Gynecology

## 2022-08-05 ENCOUNTER — Other Ambulatory Visit: Payer: Self-pay | Admitting: Cardiology

## 2022-08-18 ENCOUNTER — Encounter: Payer: Self-pay | Admitting: Cardiology

## 2022-08-18 MED ORDER — CARVEDILOL 3.125 MG PO TABS: 3.1250 mg | ORAL_TABLET | Freq: Two times a day (BID) | ORAL | 2 refills | Status: DC

## 2022-08-26 ENCOUNTER — Encounter: Payer: Self-pay | Admitting: Obstetrics and Gynecology

## 2022-08-26 ENCOUNTER — Ambulatory Visit (INDEPENDENT_AMBULATORY_CARE_PROVIDER_SITE_OTHER): Payer: HMO | Admitting: Obstetrics and Gynecology

## 2022-08-26 ENCOUNTER — Other Ambulatory Visit (HOSPITAL_COMMUNITY)
Admission: RE | Admit: 2022-08-26 | Discharge: 2022-08-26 | Disposition: A | Discharge status: Home / Self Care | Payer: HMO | Source: Ambulatory Visit | Attending: Obstetrics and Gynecology | Admitting: Obstetrics and Gynecology

## 2022-08-26 DIAGNOSIS — Z1151 Encounter for screening for human papillomavirus (HPV): Secondary | ICD-10-CM | POA: Diagnosis not present

## 2022-08-26 DIAGNOSIS — Z124 Encounter for screening for malignant neoplasm of cervix: Secondary | ICD-10-CM | POA: Diagnosis not present

## 2022-08-26 DIAGNOSIS — R1032 Left lower quadrant pain: Secondary | ICD-10-CM

## 2022-08-26 DIAGNOSIS — Z9189 Other specified personal risk factors, not elsewhere classified: Secondary | ICD-10-CM

## 2022-08-26 DIAGNOSIS — Z8041 Family history of malignant neoplasm of ovary: Secondary | ICD-10-CM | POA: Diagnosis not present

## 2022-08-26 DIAGNOSIS — E2839 Other primary ovarian failure: Secondary | ICD-10-CM | POA: Diagnosis not present

## 2022-08-26 NOTE — Progress Notes (Signed)
GYNECOLOGY  VISIT   HPI: 67 y.o.   Divorced White or Caucasian Not Hispanic or Latino  female   [id number] with Patient's last menstrual period was 12/02/2002 (approximate).   here for lower abdominal pain. Patient states that she did have lower left abdominal pain for several days but it has since resolved its self.     No vaginal. Occasionally sexually active, no pain, same long term partner.   No bowel or bladder issues.  She thinks her mammogram was in the last year with [facility].   She reports colonoscopy UTD with Dr Loreta Ave  In the last year she had a surgery for renal cancer and a MI   43 year old daughter with ovarian cancer  GYNECOLOGIC HISTORY: Patient's last menstrual period was 12/02/2002 (approximate). Contraception:pmp  Menopausal hormone therapy: none         OB History     Gravida  1   Para  1   Term  1   Preterm  0   AB  0   Living  1      SAB  0   IAB  0   Ectopic  0   Multiple  0   Live Births  1              Patient Active Problem List   Diagnosis Date Noted   Medication management 02/05/2022   PAF (paroxysmal atrial fibrillation) 02/05/2022   Depressed left ventricular ejection fraction 02/05/2022   Umbilical hernia without obstruction and without gangrene 12/18/2020   Persistent atrial fibrillation 12/01/2020   Secondary hypercoagulable state 12/01/2020   Other hemochromatosis 10/26/2020   Mild CAD 10/02/2020   Thyroid disease    Hypertension    Hypercholesteremia    Fibroids    DM2 (diabetes mellitus, type 2)    DES exposure in utero    DOE (dyspnea on exertion)    Change in bowel habit 07/10/2020   Colon cancer screening 07/10/2020   Family history of colonic polyps 07/10/2020   Personal history of colonic polyps 07/10/2020   Left renal mass 12/29/2019   Obesity (BMI 35.0-39.9 without comorbidity) 12/29/2019   Current use of long term anticoagulation 06/19/2019   Obstructive sleep apnea on CPAP 06/19/2019   Atrial  fibrillation    History of diabetes mellitus    Paroxysmal atrial fibrillation 02/27/2019   Mixed hyperlipidemia 02/27/2019   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   Family history of breast cancer in first degree relative 01/01/2014   Essential hypertension 01/01/2014   Pure hypercholesterolemia 01/01/2014   Unspecified hypothyroidism 01/01/2014   Hepatitis C 03/1998    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   Hypercholesteremia    Hypertension    Left renal mass 12/29/2019   Mixed hyperlipidemia 02/27/2019   Obesity (BMI 35.0-39.9 without comorbidity) 12/29/2019   Obstructive sleep apnea on CPAP 06/19/2019   Paroxysmal atrial fibrillation ([facility]) 02/27/2019   Pure  hypercholesterolemia 01/01/2014   Thyroid disease    hypothyroid   Unspecified hypothyroidism 01/01/2014    Past Surgical History:  Procedure Laterality Date   ATRIAL FIBRILLATION ABLATION N/A 10/07/2021   Procedure: ATRIAL FIBRILLATION ABLATION;  Surgeon: Regan Lemming, MD;  Location: [facility];  Service: Cardiovascular;  Laterality: N/A;   BUBBLE STUDY  10/16/2020   Procedure: BUBBLE STUDY;  Surgeon: Parke Poisson, MD;  Location: [facility] ENDOSCOPY;  Service: Cardiovascular;;   CARDIOVERSION N/A 10/16/2020   Procedure: CARDIOVERSION;  Surgeon: Parke Poisson, MD;  Location: [facility] ENDOSCOPY;  Service: Cardiovascular;  Laterality: N/A;   CARDIOVERSION N/A 12/04/2020   Procedure: CARDIOVERSION;  Surgeon:  Jodelle Red, MD;  Location: [facility] ENDOSCOPY;  Service: Cardiovascular;  Laterality: N/A;   CARDIOVERSION N/A 01/14/2021   Procedure: CARDIOVERSION (CATH LAB);  Surgeon: Regan Lemming, MD;  Location: [facility] INVASIVE CV LAB;  Service: Cardiovascular;  Laterality: N/A;   COLPOSCOPY  10/98   normal, no biopsy   ENDOMETRIAL ABLATION  12/02/2002   ENDOMETRIAL BIOPSY  5/03   IR RADIOLOGIST EVAL & MGMT  11/05/2020   RIGHT/LEFT HEART CATH AND CORONARY ANGIOGRAPHY N/A 07/18/2020   Procedure: RIGHT/LEFT HEART CATH AND CORONARY ANGIOGRAPHY;  Surgeon: Corky Crafts, MD;  Location[facility] INVASIVE CV LAB;  Service: Cardiovascular;  Laterality: N/A;   TEE WITHOUT CARDIOVERSION N/A 10/16/2020   Procedure: TRANSESOPHAGEAL ECHOCARDIOGRAM (TEE);  Surgeon: Parke Poisson, MD;  Location: [facility] ENDOSCOPY;  Service: Cardiovascular;  Laterality: N/A;   THYROIDECTOMY, PARTIAL     thyroid nodule excision left side    Current Outpatient Medications  Medication Sig Dispense Refill   acetaminophen (TYLENOL) 650 MG CR tablet Take 1,300 mg by mouth every 8 (eight) hours as needed for pain.     apixaban (ELIQUIS) 5 MG TABS tablet Take 1 tablet (5 mg total) by mouth 2 (two) times daily. 60 tablet 13   ASPIRIN LOW DOSE 81 MG tablet Take 81 mg by mouth at bedtime.     atorvastatin (LIPITOR) 20 MG tablet Take 20 mg by mouth every evening.     carvedilol (COREG) 3.125 MG tablet Take 1 tablet (3.125 mg total) by mouth 2 (two) times daily. 180 tablet 2   clonazePAM (KLONOPIN) 0.5 MG tablet Take 0.5 mg by mouth 2 (two) times daily as needed.     furosemide (LASIX) 40 MG tablet Take 1 tablet (40 mg total) by mouth daily. 90 tablet 3   levothyroxine (SYNTHROID, LEVOTHROID) 125 MCG tablet Take 125 mcg by mouth daily before breakfast.     magnesium oxide (MAG-OX) 400 (240 Mg) MG tablet Take 1 tablet (400 mg total) by mouth 2 (two) times daily. 90 tablet 3   nitroGLYCERIN (NITROSTAT) 0.4 MG SL tablet Place 0.4 mg under the  tongue every 5 (five) minutes as needed for chest pain.     sacubitril-valsartan (ENTRESTO) 49-51 MG Take 1 tablet by mouth 2 (two) times daily. 180 tablet 3   spironolactone (ALDACTONE) 25 MG tablet Take 1 tablet (25 mg total) by mouth daily. 90 tablet 3   No current facility-administered medications for this visit.     ALLERGIES: Tape  Family History  Problem Relation Age of Onset   Cancer Mother        lung,    Hypertension Father    Emphysema Father    Cancer Sister        tongue   Cancer Maternal Aunt        colon, cervical, throat  Heart attack Maternal Aunt    Diabetes Maternal Grandfather    Heart attack Maternal Grandmother     Social History   Socioeconomic History   Marital status: Divorced    Spouse name: Not on file   Number of children: 1   Years of education: Not on file   Highest education level: Not on file  Occupational History   Not on file  Tobacco Use   Smoking status: Former    Packs/day: 1.00    Years: 30.00    Additional pack years: 0.00    Total pack years: 30.00    Types: Cigarettes    Quit date: 07/12/2003    Years since quitting: 19.1   Smokeless tobacco: Never  Vaping Use   Vaping Use: Never used  Substance and Sexual Activity   Alcohol use: Never   Drug use: No   Sexual activity: Yes    Partners: Male    Birth control/protection: Post-menopausal  Other Topics Concern   Not on file  Social History Narrative   Not on file   Social Determinants of Health   Financial Resource Strain: Not on file  Food Insecurity: Not on file  Transportation Needs: Not on file  Physical Activity: Not on file  Stress: Not on file  Social Connections: Not on file  Intimate Partner Violence: Not on file    Review of Systems  All other systems reviewed and are negative.   PHYSICAL EXAMINATION:    LMP 12/02/2002 (Approximate)     General appearance: alert, cooperative and appears stated age Neck: no adenopathy, supple, symmetrical,  trachea midline and thyroid normal to inspection and palpation Breasts: normal appearance, no masses or tenderness Abdomen: soft, non-tender; non distended, no masses,  no organomegaly  Pelvic: External genitalia:  no lesions              Urethra:  normal appearing urethra with no masses, tenderness or lesions              Bartholins and Skenes: normal                 Vagina: normal appearing vagina with normal color and discharge, no lesions              Cervix: no lesions              Bimanual Exam:  Uterus:   enlarged, retroverted uterus, exam somewhat limited by BMI but feels c/w u/s exam which was done in 2019.               Adnexa: no mass, fullness, tenderness              Rectovaginal: Yes.  .  Confirms.              Anus:  normal sphincter tone, no lesions  Chaperone was present for exam.  1. GYN exam for high-risk Medicare patient Discussed breast self exam Discussed calcium and vit D intake Labs with primary Will get a copy of her mammogram She reports her colonoscopy is UTD  2. DES exposure in utero Needs yearly paps until she is 73, then q 3 years - Cytology - PAP  3. Screening for cervical cancer - Cytology - PAP  4. Hypoestrogenism DEXA ordered at [facility]  5. Left lower quadrant abdominal pain Symptoms have resolved, normal exam -call with recurrent pain   6. Family history of ovarian cancer She is going to her daughters MD with her, will inquire  about genetic testing. No prior FH of ovarian cancer

## 2022-08-31 LAB — CYTOLOGY - PAP
Comment: NEGATIVE
Diagnosis: NEGATIVE
High risk HPV: NEGATIVE

## 2022-09-06 ENCOUNTER — Other Ambulatory Visit: Payer: Self-pay | Admitting: Cardiology

## 2022-10-05 ENCOUNTER — Encounter: Payer: Self-pay | Admitting: Cardiology

## 2022-10-05 ENCOUNTER — Ambulatory Visit: Payer: HMO | Attending: Cardiology | Admitting: Cardiology

## 2022-10-05 VITALS — BP 112/78 | HR 52 | Ht 66.0 in | Wt 233.0 lb

## 2022-10-05 DIAGNOSIS — E782 Mixed hyperlipidemia: Secondary | ICD-10-CM

## 2022-10-05 DIAGNOSIS — I2510 Atherosclerotic heart disease of native coronary artery without angina pectoris: Secondary | ICD-10-CM | POA: Diagnosis not present

## 2022-10-05 DIAGNOSIS — G4733 Obstructive sleep apnea (adult) (pediatric): Secondary | ICD-10-CM

## 2022-10-05 DIAGNOSIS — Z7984 Long term (current) use of oral hypoglycemic drugs: Secondary | ICD-10-CM | POA: Diagnosis not present

## 2022-10-05 DIAGNOSIS — I480 Paroxysmal atrial fibrillation: Secondary | ICD-10-CM | POA: Diagnosis not present

## 2022-10-05 DIAGNOSIS — M79604 Pain in right leg: Secondary | ICD-10-CM

## 2022-10-05 DIAGNOSIS — I10 Essential (primary) hypertension: Secondary | ICD-10-CM

## 2022-10-05 DIAGNOSIS — M79605 Pain in left leg: Secondary | ICD-10-CM | POA: Diagnosis not present

## 2022-10-05 DIAGNOSIS — E1100 Type 2 diabetes mellitus with hyperosmolarity without nonketotic hyperglycemic-hyperosmolar coma (NKHHC): Secondary | ICD-10-CM | POA: Diagnosis not present

## 2022-10-05 MED ORDER — APIXABAN 5 MG PO TABS: 5.0000 mg | ORAL_TABLET | Freq: Two times a day (BID) | ORAL | 0 refills | Status: DC

## 2022-10-05 NOTE — Progress Notes (Signed)
Cardiology Office Note:    Date:  10/05/2022   ID:  Melissa Boyer, Melissa Boyer 08/04/1955, MRN [id number]  PCP:  Doran Stabler, NP  Cardiologist:  Thomasene Ripple, DO  Electrophysiologist:  Regan Lemming, MD   Referring MD: Mikki Santee Key, *   'I am doing well"  History of Present Illness:    Melissa Boyer is a 67 y.o. female with a hx of diabetes mellitus, hyperlipidemia, atrial fibrillation on Eliquis which was diagnosed in 2019 status post ablation,  sleep apnea on CPAP, hypothyroidism, former smoker, coronary artery disease nonobstructive per her recent cardiac catheterization, recent depresssed EF 30-35% which has now recovered with EF58% on cardiac MR.    I  saw the patient February 19, 2022 via we discussed her cardiac MRI result.  All of her questions during that time answered.  Prior to that she had been placed on ZIO monitor due to his palpitations.  I saw the patient on 04/2022 virtually to discuss her monitor results.  Since I saw the patient she has followed with EP.   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   Hypercholesteremia    Hypertension    Left renal mass 12/29/2019   Mixed hyperlipidemia 02/27/2019   Obesity (BMI 35.0-39.9 without comorbidity) 12/29/2019   Obstructive sleep apnea on CPAP 06/19/2019   Paroxysmal atrial fibrillation (HCC) 02/27/2019   Pure hypercholesterolemia 01/01/2014   Thyroid disease     hypothyroid   Unspecified hypothyroidism 01/01/2014    Past Surgical History:  Procedure Laterality Date   ATRIAL FIBRILLATION ABLATION N/A 10/07/2021   Procedure: ATRIAL FIBRILLATION ABLATION;  Surgeon: Regan Lemming, MD;  Location: [facility] INVASIVE CV LAB;  Service: Cardiovascular;  Laterality: N/A;   BUBBLE STUDY  10/16/2020   Procedure: BUBBLE STUDY;  Surgeon: Parke Poisson, MD;  Location: [facility] ENDOSCOPY;  Service: Cardiovascular;;   CARDIOVERSION N/A 10/16/2020   Procedure: CARDIOVERSION;  Surgeon: Parke Poisson, MD;  Location: [facility] ENDOSCOPY;  Service: Cardiovascular;  Laterality: N/A;   CARDIOVERSION N/A 12/04/2020   Procedure: CARDIOVERSION;  Surgeon: Jodelle Red, MD;  Location: [facility] ENDOSCOPY;  Service: Cardiovascular;  Laterality: N/A;   CARDIOVERSION N/A 01/14/2021   Procedure: CARDIOVERSION (CATH LAB);  Surgeon: Regan Lemming, MD;  Location: [facility] INVASIVE CV LAB;  Service: Cardiovascular;  Laterality: N/A;   COLPOSCOPY  10/98   normal, no biopsy   ENDOMETRIAL ABLATION  12/02/2002   ENDOMETRIAL BIOPSY  5/03   IR RADIOLOGIST EVAL & MGMT  11/05/2020   RIGHT/LEFT HEART CATH AND CORONARY ANGIOGRAPHY N/A 07/18/2020   Procedure: RIGHT/LEFT HEART CATH AND CORONARY ANGIOGRAPHY;  Surgeon: Corky Crafts, MD;  Location: [facility] INVASIVE CV LAB;  Service: Cardiovascular;  Laterality: N/A;   TEE WITHOUT CARDIOVERSION N/A 10/16/2020   Procedure: TRANSESOPHAGEAL ECHOCARDIOGRAM (TEE);  Surgeon: Parke Poisson, MD;  Location: [facility] ENDOSCOPY;  Service: Cardiovascular;  Laterality: N/A;   THYROIDECTOMY, PARTIAL     thyroid nodule excision left side    Current Medications: Current Meds  Medication Sig   acetaminophen (TYLENOL) 650 MG CR tablet Take 1,300 mg by mouth every 8 (eight) hours as needed for pain.   apixaban (ELIQUIS) 5 MG TABS tablet Take 1 tablet (5 mg total) by mouth 2 (two) times daily.   apixaban (ELIQUIS) 5 MG TABS tablet Take 1 tablet (5 mg total) by mouth 2 (two)  times daily.   ASPIRIN LOW DOSE 81 MG tablet Take 81 mg by mouth at bedtime.   atorvastatin (LIPITOR) 20 MG tablet Take 20 mg by mouth every evening.   carvedilol (COREG) 3.125 MG tablet Take 1 tablet (3.125 mg total) by mouth 2 (two) times daily.   clonazePAM (KLONOPIN) 0.5 MG tablet Take 0.5 mg by mouth 2 (two) times daily as needed.   furosemide (LASIX) 40 MG tablet Take 1 tablet (40 mg total) by mouth daily.   levothyroxine (SYNTHROID, LEVOTHROID) 125 MCG tablet Take 125 mcg by mouth daily before breakfast.   magnesium oxide (MAG-OX) 400 (240 Mg) MG tablet Take 1 tablet (400 mg total) by mouth 2 (two) times daily. KEEP OV.   metFORMIN (GLUCOPHAGE) 500 MG tablet Take 500 mg by mouth 2 (two) times daily with a meal.   nitroGLYCERIN (NITROSTAT) 0.4 MG SL tablet Place 0.4 mg under the tongue every 5 (five) minutes as needed for chest pain.   sacubitril-valsartan (ENTRESTO) 49-51 MG Take 1 tablet by mouth 2 (two) times daily.   spironolactone (ALDACTONE) 25 MG tablet Take 1 tablet (25 mg total) by mouth daily.     Allergies:   Tape   Social History   Socioeconomic History   Marital status: Divorced    Spouse name: Not on file   Number of children: 1   Years of education: Not on file   Highest education level: Not on file  Occupational History   Not on file  Tobacco Use   Smoking status: Former    Packs/day: 1.00    Years: 30.00    Additional pack years: 0.00    Total pack years: 30.00    Types: Cigarettes    Quit date: 07/12/2003    Years since quitting: 19.2   Smokeless tobacco: Never  Vaping Use   Vaping Use: Never used  Substance and Sexual Activity   Alcohol use: Never   Drug use: No   Sexual activity: Yes    Partners: Male    Birth control/protection: Post-menopausal  Other Topics Concern   Not on file  Social History Narrative   Not on file   Social Determinants of Health   Financial Resource Strain: Not on file  Food Insecurity: Not on file  Transportation  Needs: Not on file  Physical Activity: Not on file  Stress: Not on file  Social Connections: Not on file     Family History: The patient's family history includes Cancer in her maternal aunt, mother, and sister; Diabetes in her maternal grandfather; Emphysema in her father; Heart attack in her maternal aunt and maternal grandmother; Hypertension in her father.  ROS:   Review of Systems  Constitution: Negative for decreased appetite, fever and weight gain.  HENT: Negative for congestion, ear discharge, hoarse voice and sore throat.   Eyes: Negative for discharge, redness, vision loss in right eye and visual halos.  Cardiovascular: Negative for chest pain, dyspnea on exertion, leg swelling, orthopnea and palpitations.  Respiratory: Negative  for cough, hemoptysis, shortness of breath and snoring.   Endocrine: Negative for heat intolerance and polyphagia.  Hematologic/Lymphatic: Negative for bleeding problem. Does not bruise/bleed easily.  Skin: Negative for flushing, nail changes, rash and suspicious lesions.  Musculoskeletal: Negative for arthritis, joint pain, muscle cramps, myalgias, neck pain and stiffness.  Gastrointestinal: Negative for abdominal pain, bowel incontinence, diarrhea and excessive appetite.  Genitourinary: Negative for decreased libido, genital sores and incomplete emptying.  Neurological: Negative for brief paralysis, focal weakness, headaches and loss of balance.  Psychiatric/Behavioral: Negative for altered mental status, depression and suicidal ideas.  Allergic/Immunologic: Negative for HIV exposure and persistent infections.    EKGs/Labs/Other Studies Reviewed:    The following studies were reviewed today:   EKG:  None today  Zio monitor 03/15/2022 Patch Wear Time:  16 days and 2 hours (2023-10-09T06:16:03-399 to 2023-10-29T17:24:17-0400) Monitor 1 Atrial Flutter occurred continuously (100% burden), ranging from 48-88 bpm (avg of 69 bpm). Atrial Flutter may  be possible Atrial Tachycardia with variable block. Isolated VEs were rare (<1.0%), and no VE Couplets or VE Triplets were present. Monitor 2 Atrial Flutter occurred continuously (100% burden), ranging from 42-128 bpm (avg of 68 bpm). Isolated VEs were rare (<1.0%), and no VE Couplets or VE Triplets were present.  Conclusion: There is evidence of continuous atrial flutter.  Recent Labs: 02/04/2022: Magnesium 2.1 04/22/2022: B Natriuretic Peptide 12.8; BUN 31; Creatinine, Ser 1.43; Hemoglobin 13.2; Platelets 158; Potassium 4.5; Sodium 135  Recent Lipid Panel No results found for: "CHOL", "TRIG", "HDL", "CHOLHDL", "VLDL", "LDLCALC", "LDLDIRECT"  Physical Exam:    VS:  BP 112/78 (BP Location: Right Arm, Patient Position: Sitting, Cuff Size: Large)   Pulse (!) 52   Ht 5\' 6"  (1.676 m)   Wt 233 lb (105.7 kg)   LMP 12/02/2002 (Approximate)   SpO2 96%   BMI 37.61 kg/m     Wt Readings from Last 3 Encounters:  10/05/22 233 lb (105.7 kg)  05/10/22 228 lb (103.4 kg)  04/22/22 220 lb (99.8 kg)     GEN: Well nourished, well developed in no acute distress HEENT: Normal NECK: No JVD; No carotid bruits LYMPHATICS: No lymphadenopathy CARDIAC: S1S2 noted,RRR, no murmurs, rubs, gallops RESPIRATORY:  Clear to auscultation without rales, wheezing or rhonchi  ABDOMEN: Soft, non-tender, non-distended, +bowel sounds, no guarding. EXTREMITIES: No edema, No cyanosis, no clubbing MUSCULOSKELETAL:  No deformity  SKIN: Warm and dry NEUROLOGIC:  Alert and oriented x 3, non-focal PSYCHIATRIC:  Normal affect, good insight  ASSESSMENT:    1. Pain in both lower extremities   2. Essential hypertension   3. Paroxysmal atrial fibrillation (HCC)   4. Primary hypertension   5. Mild CAD   6. Obstructive sleep apnea on CPAP   7. Type 2 diabetes mellitus with hyperosmolarity without coma, without long-term current use of insulin (HCC)   8. Mixed hyperlipidemia    PLAN:    For leg pain will get bilateral  doppler ultrasound.  Euvolemic and otherwise stable.  No medication changes will be made today.   The patient is in agreement with the above plan. The patient left the office in stable condition.  The patient will follow up in   Medication Adjustments/Labs and Tests Ordered: Current medicines are reviewed at length with the patient today.  Concerns regarding medicines are outlined above.  Orders Placed This Encounter  Procedures   VAS [place] LOWER EXTREMITY ARTERIAL DUPLEX   Meds ordered this encounter  Medications   apixaban (ELIQUIS) 5 MG TABS tablet  Sig: Take 1 tablet (5 mg total) by mouth 2 (two) times daily.    Dispense:  30 tablet    Refill:  0    Lot [id number] EX. 10/25    Order Specific Question:   Lot Number?    Answer:   [id number]    Order Specific Question:   Expiration Date?    Answer:   02/01/2024    Patient Instructions  Medication Instructions:  Given Eliquis 5mg  4 boxes *If you need a refill on your cardiac medications before your next appointment, please call your pharmacy*  Testing/Procedures: Lower Extremity Doppler  Your physician has requested that you have a lower or upper extremity arterial duplex. This test is an ultrasound of the arteries in the legs or arms. It looks at arterial blood flow in the legs and arms. Allow one hour for Lower and Upper Arterial scans. There are no restrictions or special instructions    Follow-Up: At [facility], you and your health needs are our priority.  As part of our continuing mission to provide you with exceptional heart care, we have created designated Provider Care Teams.  These Care Teams include your primary Cardiologist (physician) and Advanced Practice Providers (APPs -  Physician Assistants and Nurse Practitioners) who all work together to provide you with the care you need, when you need it.  We recommend signing up for the patient portal called "MyChart".  Sign up information is provided on this After  Visit Summary.  MyChart is used to connect with patients for Virtual Visits (Telemedicine).  Patients are able to view lab/test results, encounter notes, upcoming appointments, etc.  Non-urgent messages can be sent to your provider as well.   To learn more about what you can do with MyChart, go to [email or web address].    Your next appointment:   9 week(s)  Provider:   Thomasene Ripple, DO       Adopting a Healthy Lifestyle.  Know what a healthy weight is for you (roughly BMI <25) and aim to maintain this   Aim for 7+ servings of fruits and vegetables daily   65-80+ fluid ounces of water or unsweet tea for healthy kidneys   Limit to max 1 drink of alcohol per day; avoid smoking/tobacco   Limit animal fats in diet for cholesterol and heart health - choose grass fed whenever available   Avoid highly processed foods, and foods high in saturated/trans fats   Aim for low stress - take time to unwind and care for your mental health   Aim for 150 min of moderate intensity exercise weekly for heart health, and weights twice weekly for bone health   Aim for 7-9 hours of sleep daily   When it comes to diets, agreement about the perfect plan isnt easy to find, even among the experts. Experts at the [facility] [employer] developed an idea known as the Healthy Eating Plate. Just imagine a plate divided into logical, healthy portions.   The emphasis is on diet quality:   Load up on vegetables and fruits - one-half of your plate: Aim for color and variety, and remember that potatoes dont count.   Go for whole grains - one-quarter of your plate: Whole wheat, barley, wheat berries, quinoa, oats, brown rice, and foods made with them. If you want pasta, go with whole wheat pasta.   Protein power - one-quarter of your plate: Fish, chicken, beans, and nuts are all healthy, versatile protein sources. Limit red meat.  The diet, however, does go beyond the plate, offering a few other  suggestions.   Use healthy plant oils, such as olive, canola, soy, corn, sunflower and peanut. Check the labels, and avoid partially hydrogenated oil, which have unhealthy trans fats.   If youre thirsty, drink water. Coffee and tea are good in moderation, but skip sugary drinks and limit milk and dairy products to one or two daily servings.   The type of carbohydrate in the diet is more important than the amount. Some sources of carbohydrates, such as vegetables, fruits, whole grains, and beans-are healthier than others.   Finally, stay active  Signed, Thomasene Ripple, DO  10/05/2022 10:27 PM    [facility]   Medication Adjustments/Labs and Tests Ordered: Current medicines are reviewed at length with the patient today.  Concerns regarding medicines are outlined above.   Tests Ordered: Orders Placed This Encounter  Procedures   VAS [place] LOWER EXTREMITY ARTERIAL DUPLEX    Medication Changes: Meds ordered this encounter  Medications   apixaban (ELIQUIS) 5 MG TABS tablet    Sig: Take 1 tablet (5 mg total) by mouth 2 (two) times daily.    Dispense:  30 tablet    Refill:  0    Lot [id number] EX. 10/25    Order Specific Question:   Lot Number?    Answer:   [id number]    Order Specific Question:   Expiration Date?    Answer:   02/01/2024    Follow Up:  In Person in 6 week(s)  Signed, Thomasene Ripple, DO  10/05/2022 10:27 PM    [facility]

## 2022-10-05 NOTE — Patient Instructions (Addendum)
Medication Instructions:  Given Eliquis 5mg  4 boxes *If you need a refill on your cardiac medications before your next appointment, please call your pharmacy*  Testing/Procedures: Lower Extremity Doppler  Your physician has requested that you have a lower or upper extremity arterial duplex. This test is an ultrasound of the arteries in the legs or arms. It looks at arterial blood flow in the legs and arms. Allow one hour for Lower and Upper Arterial scans. There are no restrictions or special instructions    Follow-Up: At [facility], you and your health needs are our priority.  As part of our continuing mission to provide you with exceptional heart care, we have created designated Provider Care Teams.  These Care Teams include your primary Cardiologist (physician) and Advanced Practice Providers (APPs -  Physician Assistants and Nurse Practitioners) who all work together to provide you with the care you need, when you need it.  We recommend signing up for the patient portal called "MyChart".  Sign up information is provided on this After Visit Summary.  MyChart is used to connect with patients for Virtual Visits (Telemedicine).  Patients are able to view lab/test results, encounter notes, upcoming appointments, etc.  Non-urgent messages can be sent to your provider as well.   To learn more about what you can do with MyChart, go to [email or web address].    Your next appointment:   9 week(s)  Provider:   Thomasene Ripple, DO

## 2022-10-14 DIAGNOSIS — R3915 Urgency of urination: Secondary | ICD-10-CM | POA: Diagnosis not present

## 2022-10-14 DIAGNOSIS — C642 Malignant neoplasm of left kidney, except renal pelvis: Secondary | ICD-10-CM | POA: Diagnosis not present

## 2022-10-14 DIAGNOSIS — R3121 Asymptomatic microscopic hematuria: Secondary | ICD-10-CM | POA: Diagnosis not present

## 2022-10-15 DIAGNOSIS — Z6836 Body mass index (BMI) 36.0-36.9, adult: Secondary | ICD-10-CM | POA: Diagnosis not present

## 2022-10-15 DIAGNOSIS — I509 Heart failure, unspecified: Secondary | ICD-10-CM | POA: Diagnosis not present

## 2022-10-15 DIAGNOSIS — Z9181 History of falling: Secondary | ICD-10-CM | POA: Diagnosis not present

## 2022-10-15 DIAGNOSIS — I4891 Unspecified atrial fibrillation: Secondary | ICD-10-CM | POA: Diagnosis not present

## 2022-10-15 DIAGNOSIS — Z139 Encounter for screening, unspecified: Secondary | ICD-10-CM | POA: Diagnosis not present

## 2022-10-15 DIAGNOSIS — E039 Hypothyroidism, unspecified: Secondary | ICD-10-CM | POA: Diagnosis not present

## 2022-10-15 DIAGNOSIS — I10 Essential (primary) hypertension: Secondary | ICD-10-CM | POA: Diagnosis not present

## 2022-10-15 DIAGNOSIS — G4762 Sleep related leg cramps: Secondary | ICD-10-CM | POA: Diagnosis not present

## 2022-10-15 DIAGNOSIS — E785 Hyperlipidemia, unspecified: Secondary | ICD-10-CM | POA: Diagnosis not present

## 2022-10-15 DIAGNOSIS — M79671 Pain in right foot: Secondary | ICD-10-CM | POA: Diagnosis not present

## 2022-10-15 DIAGNOSIS — E1165 Type 2 diabetes mellitus with hyperglycemia: Secondary | ICD-10-CM | POA: Diagnosis not present

## 2022-11-08 ENCOUNTER — Ambulatory Visit: Payer: HMO | Attending: Cardiology

## 2022-11-08 ENCOUNTER — Other Ambulatory Visit: Payer: Self-pay | Admitting: Cardiology

## 2022-11-08 ENCOUNTER — Ambulatory Visit (INDEPENDENT_AMBULATORY_CARE_PROVIDER_SITE_OTHER): Payer: HMO

## 2022-11-08 ENCOUNTER — Ambulatory Visit (INDEPENDENT_AMBULATORY_CARE_PROVIDER_SITE_OTHER): Payer: HMO | Admitting: Podiatry

## 2022-11-08 DIAGNOSIS — M79605 Pain in left leg: Secondary | ICD-10-CM

## 2022-11-08 DIAGNOSIS — G4733 Obstructive sleep apnea (adult) (pediatric): Secondary | ICD-10-CM

## 2022-11-08 DIAGNOSIS — D2121 Benign neoplasm of connective and other soft tissue of right lower limb, including hip: Secondary | ICD-10-CM | POA: Diagnosis not present

## 2022-11-08 DIAGNOSIS — I480 Paroxysmal atrial fibrillation: Secondary | ICD-10-CM

## 2022-11-08 DIAGNOSIS — E1100 Type 2 diabetes mellitus with hyperosmolarity without nonketotic hyperglycemic-hyperosmolar coma (NKHHC): Secondary | ICD-10-CM

## 2022-11-08 DIAGNOSIS — M79604 Pain in right leg: Secondary | ICD-10-CM

## 2022-11-08 DIAGNOSIS — E782 Mixed hyperlipidemia: Secondary | ICD-10-CM

## 2022-11-08 DIAGNOSIS — I10 Essential (primary) hypertension: Secondary | ICD-10-CM

## 2022-11-08 DIAGNOSIS — I2510 Atherosclerotic heart disease of native coronary artery without angina pectoris: Secondary | ICD-10-CM

## 2022-11-08 NOTE — Progress Notes (Signed)
Subjective:  Patient ID: Melissa Boyer, female    DOB: [birth year],  MRN: [id number]  Chief Complaint  Patient presents with   Foot Pain    Right foot pain on the arch near the ball of right foot medial aspect of foot. Pain started a couple of months ago. Patient has a lump that is growing in size and it is painful.     67 y.o. female presents with concern for painful lump on the bottom of the right foot near the inside of her arch.  She says has been going for a while very slow-growing but it is increasing in size.  It occasionally causes her pain when she steps on a certain way and certain shoes but does not cause her much pain when she is wearing sneakers.  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   Hypercholesteremia    Hypertension    Left renal mass 12/29/2019   Mixed hyperlipidemia 02/27/2019   Obesity (BMI 35.0-39.9 without comorbidity) 12/29/2019   Obstructive sleep apnea on CPAP 06/19/2019   Paroxysmal atrial fibrillation (HCC) 02/27/2019   Pure hypercholesterolemia 01/01/2014   Thyroid disease    hypothyroid   Unspecified hypothyroidism 01/01/2014    Allergies  Allergen Reactions   Tape     Adhesives  Paper tape takes off skin      ROS: Negative except as per HPI above  Objective:  General: AAO x3, NAD  Dermatological: Firm palpable mass present in the plantar medial band of the plantar fascia on the right foot consistent with plantar fibroma proximal 2 cm in diameter.  Moves the plantar fascia  with hallux dorsiflexion.  Mildly tender on palpation  Vascular:  Dorsalis Pedis artery and Posterior Tibial artery pedal pulses are 2/4 bilateral.  Capillary fill time < 3 sec to all digits.   Neruologic: Grossly intact via light touch bilateral. Protective threshold intact to all sites bilateral.   Musculoskeletal: No gross boney pedal deformities bilateral. No pain, crepitus, or limitation noted with foot and ankle range of motion bilateral. Muscular strength 5/5 in all groups tested bilateral.  Gait: Unassisted, Nonantalgic.   No images are attached to the encounter.  Radiographs:  Date: 7024 XR right foot weightbearing AP/Lateral/Oblique   Findings: Arthritic changes noted throughout the foot however no osseous prominence noted to the plantar medial forefoot Assessment:   1. Fibroma of foot, right      Plan:  Patient was evaluated and treated and all questions answered.  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  Recommend continued monitoring of the lesion and if it  gets bigger or causes her pain more consistently to call we will get her back and discuss further treatment options  Return if symptoms worsen or fail to improve.          Corinna Gab, DPM [facility]

## 2022-11-15 LAB — VAS US ABI WITH/WO TBI
Left ABI: 1.02
Right ABI: 1.08

## 2022-12-16 IMAGING — CT CT HEART MORPH/PULM VEIN W/ CM & W/O CA SCORE
2 of 6 series · 12 of 20 positions shown, 14 images · IV contrast (Omni 300)
Comparison: None Available.
COMPARISON: None Available.

Addendum:
EXAM:
OVER-READ INTERPRETATION  CT CHEST

The following report is a limited chest CT over-read performed by
10/05/2021. This over-read does not include interpretation of cardiac
or coronary anatomy or pathology. The coronary calcium score and
cardiac CTA interpretation by the cardiologist is attached.
CLINICAL DATA: Atrial fibrillation scheduled for an ablation.
Cardiac CT/CTA
TECHNIQUE: The patient was scanned on a Siemens Somatom scanner.

[Series 8: 0-90% · axial · 0.36mm/px · z∈[+1224,+1322]mm · 6 of 2710 slices shown, 8 images]
[im 388/2710  vessel]
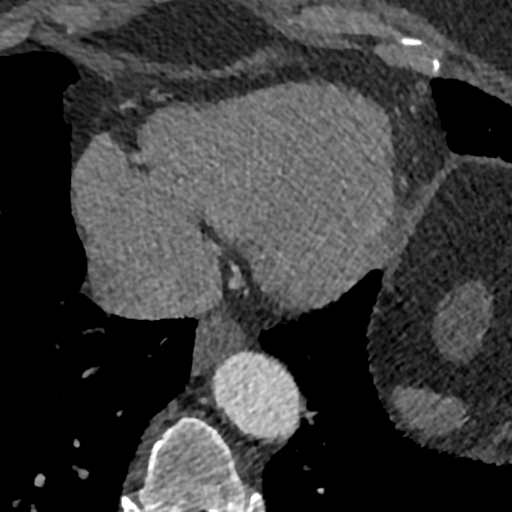
[im 388/2710  lung]
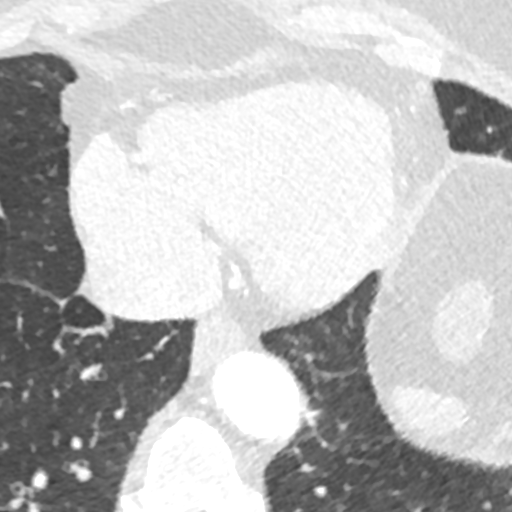
[im 775/2710  vessel]
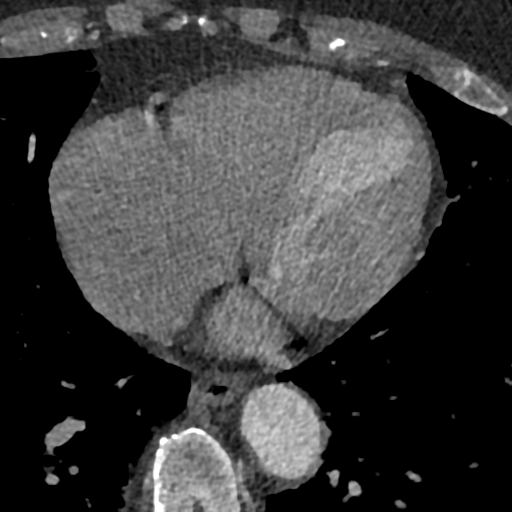
[im 1162/2710  vessel]
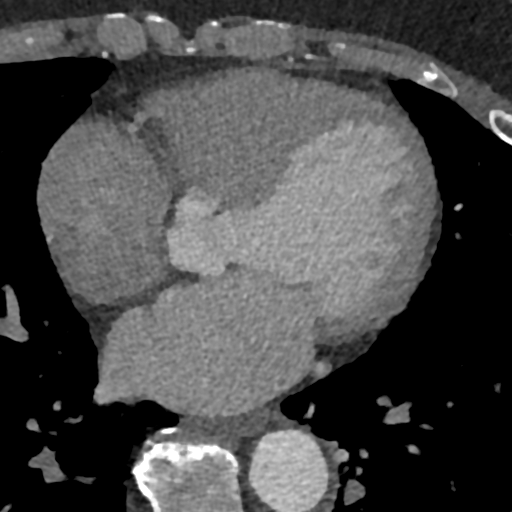
[im 1549/2710  vessel]
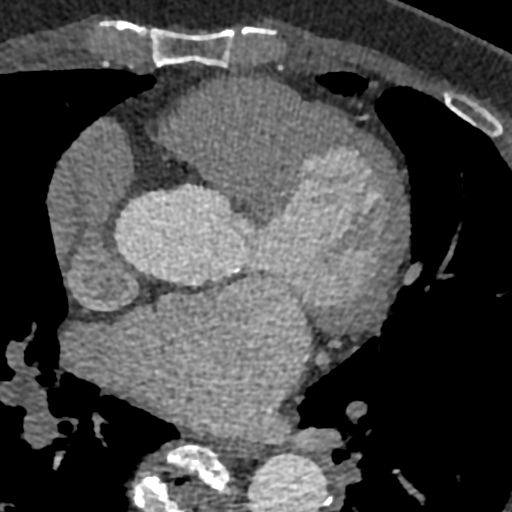
[im 1936/2710  vessel]
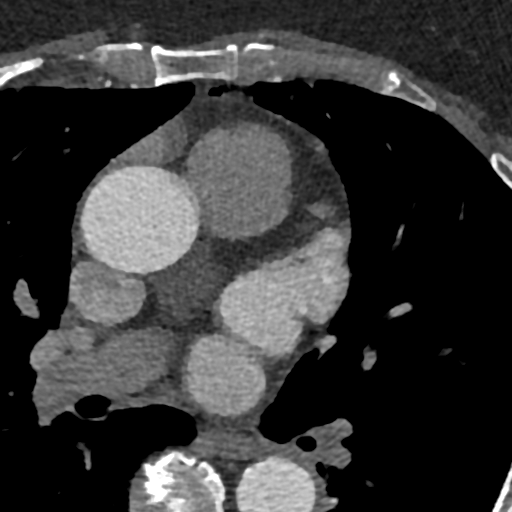
[im 1936/2710  lung]
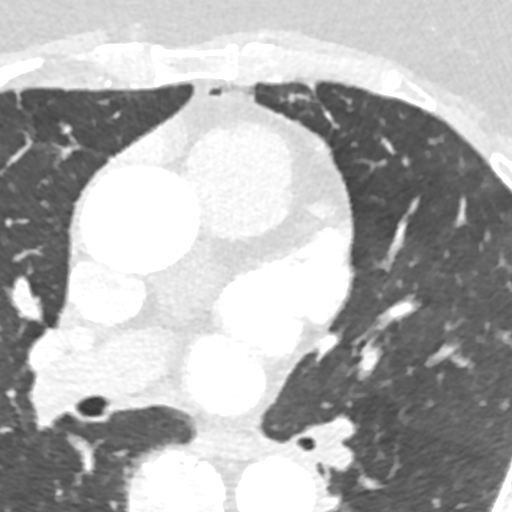
[im 2323/2710  vessel]
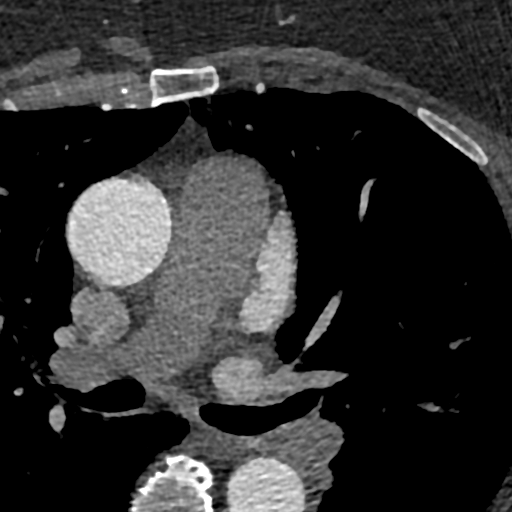

[Series 13: 5-95% · axial · 0.74mm/px · z∈[+1224,+1321]mm · 6 of 2710 slices shown]
[im 388/2710  vessel]
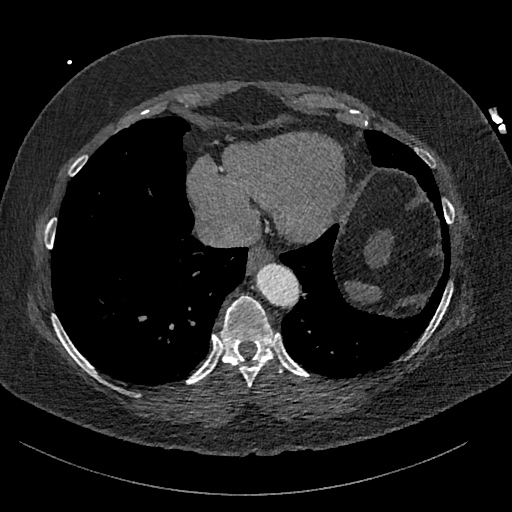
[im 775/2710  vessel]
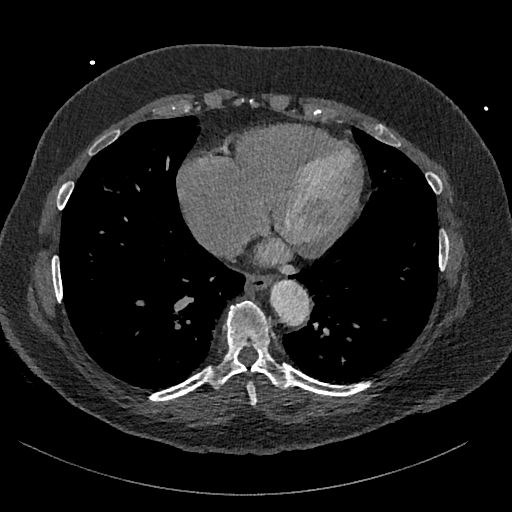
[im 1162/2710  vessel]
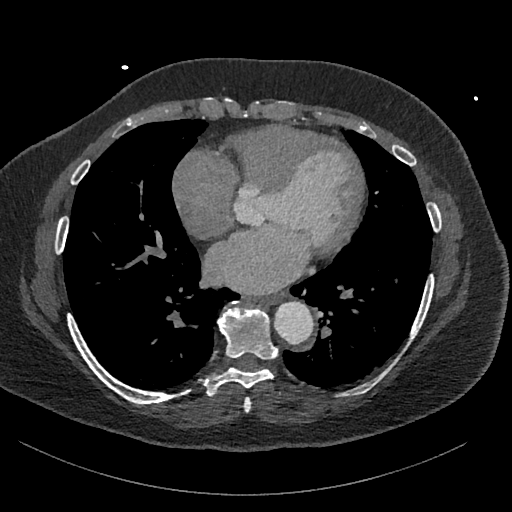
[im 1549/2710  vessel]
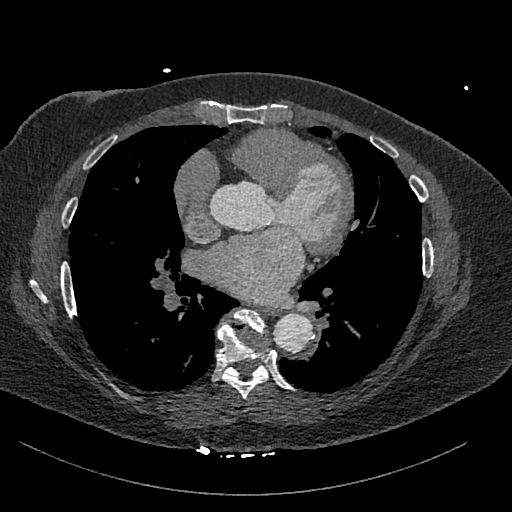
[im 1936/2710  vessel]
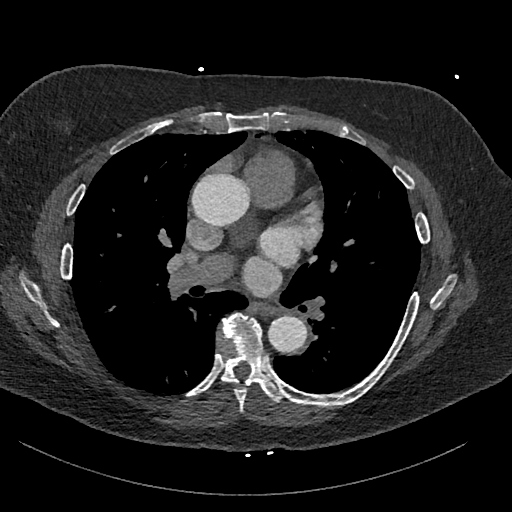
[im 2323/2710  vessel]
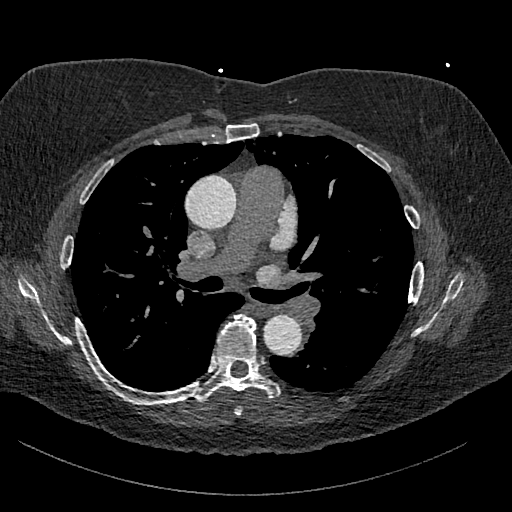

[12 of 20 positions shown; findings below may reference images not displayed]

FINDINGS: Vascular: No acute noncardiac vascular finding.

Mediastinum/Nodes: No pathologically enlarged mediastinal or hilar
lymph nodes within the acquired field-of-view. Distal esophagus is
grossly unremarkable.

Lungs/Pleura: Mild diffuse bronchial wall thickening with mosaic
attenuation of the lungs. Suspicious pulmonary nodules or masses and
no pleural effusion or pneumothorax within the acquired
field-of-view.

Upper Abdomen: Limited evaluation reveals no acute abnormality.

Musculoskeletal: Thoracic spondylosis.  No acute abnormality.
IMPRESSION: Mild diffuse bronchial wall thickening with mosaic attenuation of
the lungs possibly reflecting small airways disease.
FINDINGS: A 120 kV prospective scan was triggered in the descending thoracic
aorta at 111 HU's. Gantry rotation speed was 280 msecs and
collimation was .9 mm. No beta blockade and no NTG was given. The 3D
data set was reconstructed in 5% intervals of the 0-95 % of the R-R
cycle. Diastolic phases were analyzed on a dedicated work station
using MPR, MIP and VRT modes. The patient received 95mL OMNIPAQUE
IOHEXOL 350 MG/ML SOLN.

Image quality is average, moderate noise and cardiac motion
artifact.

There is normal pulmonary vein drainage into the left atrium (3 on
the right and 2 on the left) with ostial measurements as follows:

RUPV: 28.7 x 23.7 mm, area 5.14 cm2

RMLPV: 9.2 x 8.7 mm, area 0.62 cm2

RLPV: 12.9 x 10.8 mm, area 1.10 cm2

LUPV: 25.5 x 22.5 mm, area 4.55 cm2

LLPV: 16.0 x 14.0 mm, area 1.71 cm2

No anomalous pulmonary venous drainage. No pulmonary vein stenosis.

Normal left atrial appendage, no left atrial appendage thrombus. No
intracardiac mass or thrombus.

The esophagus runs in the left atrial midline and is not in the
proximity to any of the pulmonary veins.

Aorta: Borderline dilation of ascending aorta 38 mm. No dissection.
Mild calcifications.

Aortic Valve:  Trileaflet.  No calcifications.

Coronary Arteries: Normal coronary origin. Right dominance. The
study was performed without use of NTG and insufficient for plaque
evaluation. Coronary artery calcium score is 77.1, which is 77th
percentile for age and sex matched peers.

Pulmonary artery: Normal caliber main pulmonary artery.
IMPRESSION: 1. There is normal pulmonary vein drainage into the left atrium. No
pulmonary vein stenosis.

2. Normal left atrial appendage, no left atrial appendage thrombus.
No intracardiac mass or thrombus.

3. The esophagus runs in the left atrial midline and is not in the
proximity to any of the pulmonary veins.

4. Coronary artery calcium score is 77.1, which is 77th percentile
for age and sex matched peers.

5. Borderline dilation of ascending aorta 38 mm.

*** End of Addendum ***
EXAM:
OVER-READ INTERPRETATION  CT CHEST

The following report is a limited chest CT over-read performed by
10/05/2021. This over-read does not include interpretation of cardiac
or coronary anatomy or pathology. The coronary calcium score and
cardiac CTA interpretation by the cardiologist is attached.
FINDINGS: Vascular: No acute noncardiac vascular finding.

Mediastinum/Nodes: No pathologically enlarged mediastinal or hilar
lymph nodes within the acquired field-of-view. Distal esophagus is
grossly unremarkable.

Lungs/Pleura: Mild diffuse bronchial wall thickening with mosaic
attenuation of the lungs. Suspicious pulmonary nodules or masses and
no pleural effusion or pneumothorax within the acquired
field-of-view.

Upper Abdomen: Limited evaluation reveals no acute abnormality.

Musculoskeletal: Thoracic spondylosis.  No acute abnormality.
IMPRESSION: Mild diffuse bronchial wall thickening with mosaic attenuation of
the lungs possibly reflecting small airways disease.

## 2023-01-08 ENCOUNTER — Other Ambulatory Visit: Payer: Self-pay | Admitting: Student

## 2023-01-21 ENCOUNTER — Encounter (HOSPITAL_BASED_OUTPATIENT_CLINIC_OR_DEPARTMENT_OTHER): Payer: Self-pay | Admitting: *Deleted

## 2023-01-21 ENCOUNTER — Emergency Department (HOSPITAL_BASED_OUTPATIENT_CLINIC_OR_DEPARTMENT_OTHER)
Admission: EM | Admit: 2023-01-21 | Discharge: 2023-01-21 | Disposition: A | Discharge status: Home / Self Care | Payer: HMO | Attending: Emergency Medicine | Admitting: Emergency Medicine

## 2023-01-21 ENCOUNTER — Other Ambulatory Visit: Payer: Self-pay

## 2023-01-21 ENCOUNTER — Emergency Department (HOSPITAL_BASED_OUTPATIENT_CLINIC_OR_DEPARTMENT_OTHER): Payer: HMO

## 2023-01-21 DIAGNOSIS — Z87891 Personal history of nicotine dependence: Secondary | ICD-10-CM | POA: Diagnosis not present

## 2023-01-21 DIAGNOSIS — R1033 Periumbilical pain: Secondary | ICD-10-CM | POA: Diagnosis present

## 2023-01-21 DIAGNOSIS — Z7901 Long term (current) use of anticoagulants: Secondary | ICD-10-CM | POA: Diagnosis not present

## 2023-01-21 DIAGNOSIS — I2510 Atherosclerotic heart disease of native coronary artery without angina pectoris: Secondary | ICD-10-CM | POA: Insufficient documentation

## 2023-01-21 DIAGNOSIS — E119 Type 2 diabetes mellitus without complications: Secondary | ICD-10-CM | POA: Diagnosis not present

## 2023-01-21 DIAGNOSIS — E039 Hypothyroidism, unspecified: Secondary | ICD-10-CM | POA: Diagnosis not present

## 2023-01-21 DIAGNOSIS — Z79899 Other long term (current) drug therapy: Secondary | ICD-10-CM | POA: Insufficient documentation

## 2023-01-21 DIAGNOSIS — L03311 Cellulitis of abdominal wall: Secondary | ICD-10-CM | POA: Diagnosis not present

## 2023-01-21 DIAGNOSIS — Z7982 Long term (current) use of aspirin: Secondary | ICD-10-CM | POA: Insufficient documentation

## 2023-01-21 DIAGNOSIS — L03818 Cellulitis of other sites: Secondary | ICD-10-CM | POA: Insufficient documentation

## 2023-01-21 DIAGNOSIS — Z7984 Long term (current) use of oral hypoglycemic drugs: Secondary | ICD-10-CM | POA: Diagnosis not present

## 2023-01-21 LAB — COMPREHENSIVE METABOLIC PANEL
ALT: 21 U/L (ref 0–44)
AST: 20 U/L (ref 15–41)
Albumin: 4.3 g/dL (ref 3.5–5.0)
Alkaline Phosphatase: 66 U/L (ref 38–126)
Anion gap: 13 (ref 5–15)
BUN: 35 mg/dL — ABNORMAL HIGH (ref 8–23)
CO2: 21 mmol/L — ABNORMAL LOW (ref 22–32)
Calcium: 9.4 mg/dL (ref 8.9–10.3)
Chloride: 101 mmol/L (ref 98–111)
Creatinine, Ser: 1.23 mg/dL — ABNORMAL HIGH (ref 0.44–1.00)
GFR, Estimated: 48 mL/min — ABNORMAL LOW (ref >60)
Glucose, Bld: 215 mg/dL — ABNORMAL HIGH (ref 70–99)
Potassium: 4.2 mmol/L (ref 3.5–5.1)
Sodium: 135 mmol/L (ref 135–145)
Total Bilirubin: 0.6 mg/dL (ref 0.3–1.2)
Total Protein: 7.7 g/dL (ref 6.5–8.1)

## 2023-01-21 LAB — CBC WITH DIFFERENTIAL/PLATELET
Abs Immature Granulocytes: 0.04 10*3/uL (ref 0.00–0.07)
Basophils Absolute: 0.1 10*3/uL (ref 0.0–0.1)
Basophils Relative: 1 %
Eosinophils Absolute: 0.2 10*3/uL (ref 0.0–0.5)
Eosinophils Relative: 2 %
HCT: 38.7 % (ref 36.0–46.0)
Hemoglobin: 13.2 g/dL (ref 12.0–15.0)
Immature Granulocytes: 1 %
Lymphocytes Relative: 31 %
Lymphs Abs: 2.5 10*3/uL (ref 0.7–4.0)
MCH: 31.1 pg (ref 26.0–34.0)
MCHC: 34.1 g/dL (ref 30.0–36.0)
MCV: 91.1 fL (ref 80.0–100.0)
Monocytes Absolute: 0.4 10*3/uL (ref 0.1–1.0)
Monocytes Relative: 6 %
Neutro Abs: 4.7 10*3/uL (ref 1.7–7.7)
Neutrophils Relative %: 59 %
Platelets: 219 10*3/uL (ref 150–400)
RBC: 4.25 MIL/uL (ref 3.87–5.11)
RDW: 12.9 % (ref 11.5–15.5)
WBC: 7.9 10*3/uL (ref 4.0–10.5)
nRBC: 0 % (ref 0.0–0.2)

## 2023-01-21 MED ORDER — IOHEXOL 300 MG/ML  SOLN
80.0000 mL | Freq: Once | INTRAMUSCULAR | Status: AC | PRN
  Administered 2023-01-21: 80 mL via INTRAVENOUS

## 2023-01-21 MED ORDER — KETOCONAZOLE 2 % EX CREA: 1.0000 | TOPICAL_CREAM | Freq: Every day | CUTANEOUS | 0 refills | Status: DC

## 2023-01-21 MED ORDER — CEPHALEXIN 500 MG PO CAPS: 500.0000 mg | ORAL_CAPSULE | Freq: Two times a day (BID) | ORAL | 0 refills | Status: DC

## 2023-01-21 NOTE — ED Provider Notes (Signed)
[place] [facility] Provider Note   CSN: [id number] Arrival date & time: 01/21/23  1438     History  Chief Complaint  Patient presents with   Hernia    Melissa Boyer is a 67 y.o. female With past medical history of sleep apnea, hypothyroidism, former smoker, CAD diabetes, HLD, A-fib (AC with Eliquis) presents to the emergency department complaining of pus and blood from her umbilicus and umbilical pain that started 2 days ago.  She denies injury to abdomen, fever.  Of note, she has had an umbilical hernia in the past that was surgically repaired on 03/2021.  The history is provided by the patient. No language interpreter was used.       Home Medications Prior to Admission medications   Medication Sig Start Date End Date Taking? Authorizing Provider  cephALEXin (KEFLEX) 500 MG capsule Take 1 capsule (500 mg total) by mouth 2 (two) times daily. 01/21/23  Yes Judithann Sheen, PA  ketoconazole (NIZORAL) 2 % cream Apply 1 Application topically daily. Apply to bellybutton once a day for 14 days 01/21/23  Yes Judithann Sheen, PA  acetaminophen (TYLENOL) 650 MG CR tablet Take 1,300 mg by mouth every 8 (eight) hours as needed for pain.    [provider]  apixaban (ELIQUIS) 5 MG TABS tablet Take 1 tablet (5 mg total) by mouth 2 (two) times daily. 08/28/21 10/08/22  Revankar, Aundra Dubin, MD  apixaban (ELIQUIS) 5 MG TABS tablet Take 1 tablet (5 mg total) by mouth 2 (two) times daily. 10/05/22   Tobb, Kardie, DO  ASPIRIN LOW DOSE 81 MG tablet Take 81 mg by mouth at bedtime. 01/13/22   [provider]  atorvastatin (LIPITOR) 20 MG tablet Take 20 mg by mouth every evening.    [provider]  carvedilol (COREG) 3.125 MG tablet Take 1 tablet (3.125 mg total) by mouth 2 (two) times daily. 08/18/22   Tobb, Kardie, DO  clonazePAM (KLONOPIN) 0.5 MG tablet Take 0.5 mg by mouth 2 (two) times daily as needed. 02/17/22   [provider]   furosemide (LASIX) 40 MG tablet Take 1 tablet (40 mg total) by mouth daily. 04/06/22   Tobb, Kardie, DO  levothyroxine (SYNTHROID, LEVOTHROID) 125 MCG tablet Take 125 mcg by mouth daily before breakfast. 12/11/13   [provider]  magnesium oxide (MAG-OX) 400 (240 Mg) MG tablet Take 1 tablet (400 mg total) by mouth 2 (two) times daily. KEEP OV. 09/06/22   Camnitz, Andree Coss, MD  metFORMIN (GLUCOPHAGE) 500 MG tablet Take 500 mg by mouth 2 (two) times daily with a meal.    [provider]  nitroGLYCERIN (NITROSTAT) 0.4 MG SL tablet Place 0.4 mg under the tongue every 5 (five) minutes as needed for chest pain.    [provider]  sacubitril-valsartan (ENTRESTO) 49-51 MG Take 1 tablet by mouth 2 (two) times daily. 04/06/22   Tobb, Kardie, DO  spironolactone (ALDACTONE) 25 MG tablet TAKE 1 TABLET(25 MG) BY MOUTH DAILY 01/10/23   Tobb, Kardie, DO      Allergies    Tape    Review of Systems   Review of Systems  Constitutional:  Negative for fever.  Respiratory:  Negative for shortness of breath.   Cardiovascular:  Negative for chest pain.  Gastrointestinal:  Negative for diarrhea, nausea and vomiting.       Umbilical pain and drainage    Physical Exam Updated Vital Signs BP (!) 103/55  Pulse (!) 52   Temp 97.7 F (36.5 C) (Oral)   Resp 16   Ht 5\' 6"  (1.676 m)   Wt 99.8 kg   LMP 12/02/2002 (Approximate)   SpO2 98%   BMI 35.51 kg/m  Physical Exam Vitals and nursing note reviewed.  Constitutional:      General: She is not in acute distress.    Appearance: She is well-developed.  HENT:     Head: Normocephalic and atraumatic.  Eyes:     Conjunctiva/sclera: Conjunctivae normal.  Cardiovascular:     Rate and Rhythm: Normal rate and regular rhythm.     Heart sounds: No murmur heard. Pulmonary:     Effort: Pulmonary effort is normal. No respiratory distress.     Breath sounds: Normal breath sounds.  Abdominal:     Palpations: Abdomen is soft.     Comments:  Pain with mild palpation in umbilical region with no rigidity or mass noted 3cm circle of erythema and warmth noted below umbilicus No pus or blood from umbilicus appreciated upon exam  Musculoskeletal:     Cervical back: Neck supple.  Skin:    General: Skin is warm and dry.     Capillary Refill: Capillary refill takes less than 2 seconds.  Neurological:     Mental Status: She is alert and oriented to person, place, and time.  Psychiatric:        Mood and Affect: Mood normal.     ED Results / Procedures / Treatments   Labs (all labs ordered are listed, but only abnormal results are displayed) Labs Reviewed  COMPREHENSIVE METABOLIC PANEL - Abnormal; Notable for the following components:      Result Value   CO2 21 (*)    Glucose, Bld 215 (*)    BUN 35 (*)    Creatinine, Ser 1.23 (*)    GFR, Estimated 48 (*)    All other components within normal limits  CBC WITH DIFFERENTIAL/PLATELET    EKG None  Radiology CT ABDOMEN PELVIS W CONTRAST  Result Date: 01/21/2023 CLINICAL DATA:  Abdominal pain.  History of partial left nephrectomy EXAM: CT ABDOMEN AND PELVIS WITH CONTRAST TECHNIQUE: Multidetector CT imaging of the abdomen and pelvis was performed using the standard protocol following bolus administration of intravenous contrast. RADIATION DOSE REDUCTION: This exam was performed according to the departmental dose-optimization program which includes automated exposure control, adjustment of the mA and/or kV according to patient size and/or use of iterative reconstruction technique. CONTRAST:  80mL OMNIPAQUE IOHEXOL 300 MG/ML  SOLN COMPARISON:  None Available. FINDINGS: Lower chest: No acute abnormality. Hepatobiliary: Liver measures 20 cm in length. Diffusely decreased attenuation of the hepatic parenchyma. No focal liver lesion is identified. Unremarkable gallbladder. No hyperdense gallstone. No biliary dilatation. Pancreas: Unremarkable. No pancreatic ductal dilatation or surrounding  inflammatory changes. Spleen: Normal in size without focal abnormality. Adrenals/Urinary Tract: Unremarkable adrenal glands. 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 No suspicious left renal mass. Rounded 1.1 cm heterogeneously hyperattenuating lesion within the interpolar region of the right kidney (series 601, image 82). No renal stone or hydronephrosis. Urinary bladder within normal limits. Stomach/Bowel: Stomach is within normal limits. Appendix appears normal. Scattered colonic diverticulosis. No evidence of bowel wall thickening, distention, or inflammatory changes. Vascular/Lymphatic: Aortic atherosclerosis. No enlarged abdominal or pelvic lymph nodes. Reproductive: Retroverted uterus containing multiple fibroids, largest measuring up to 6.5 cm. No adnexal masses identified. Other: No free fluid. No abdominopelvic fluid collection. No pneumoperitoneum. No abdominal wall  hernia. Musculoskeletal: No acute or significant osseous findings. Lumbar levocurvature with multilevel degenerative disc disease, most pronounced at the L1-2, L2-3, and L5-S1 levels. IMPRESSION: 1. No acute abdominopelvic findings. 2. 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 No suspicious left renal mass. 3. Rounded 1.1 cm heterogeneously hyperattenuating lesion within the interpolar region of the right kidney. This may represent a hemorrhagic/proteinaceous cyst, however a solid renal neoplasm is not excluded. Further evaluation with nonemergent renal protocol MRI is recommended. 4. Hepatomegaly and hepatic steatosis. 5. Colonic diverticulosis without evidence of acute diverticulitis. 6. Fibroid uterus. 7. Aortic atherosclerosis (ICD10-I70.0). Electronically Signed   By: Duanne Guess D.O.   On: 01/21/2023 17:55    Procedures Procedures    Medications Ordered in ED Medications  iohexol (OMNIPAQUE) 300 MG/ML solution 80 mL (80 mLs Intravenous Contrast Given  01/21/23 1617)    ED Course/ Medical Decision Making/ A&P                                 Medical Decision Making Amount and/or Complexity of Data Reviewed Labs: ordered. Radiology: ordered.   Upon evaluation, patient is resting comfortably in bed in no acute distress. She is not ill-appearing at this time.  Please see HPI.  Upon assessment, patient is tender to palpation inferior to umbilicus.  There is erythema and warmth inferior to umbilicus.  I personally reviewed lab work that is not significant for leukocytosis, anemia, electrolyte abnormalities.  Differential diagnosis include abscess, fistula, postop infection, strangulated hernia, cellulitis.  CT impression is not significant for acute intra-abdominal pathology, abscess, free peritoneal fluid, however inflammation is noted on abdominal wall below umbilicus.  Based on physical exam and inflammation noted on CT, will start antibiotics.  Based on location and risk for fungal infection, will also start antifungal for mild cellulitis.  Of note there is a 1.1 cm lesion on right kidney noted on CT with recommendation for outpatient MRI.  Discussed this finding with patient who expresses understanding.  Patient is stable for discharge.  Explained imaging findings, lab work, treatment plan with patient who expresses understanding and agrees with plan.  Keflex and ketoconazole cream sent to pharmacy.  Discussed how to take antibiotic and use ketoconazole cream.  Return to emergency department precautions explained to patient to include but not limited to worsening symptoms, spreading of redness, worsening pain, constant vomiting, increased/worsening drainage despite treatment.        Final Clinical Impression(s) / ED Diagnoses Final diagnoses:  Cellulitis of other specified site    Rx / DC Orders ED Discharge Orders          Ordered    cephALEXin (KEFLEX) 500 MG capsule  2 times daily        01/21/23 1833    ketoconazole  (NIZORAL) 2 % cream  Daily        01/21/23 1833              Judithann Sheen, PA 01/21/23 1844    Loetta Rough, MD 01/23/23 [phone]

## 2023-01-21 NOTE — Discharge Instructions (Addendum)
Thank you for letting us take care of you today.  Please pick up antibiotics and antifungal and take as prescribed  Return to emergency department if worsening symptoms, spreading of redness, worsening pain, constant vomiting, increased/worsening drainage despite treatment

## 2023-01-21 NOTE — ED Triage Notes (Signed)
Patient presents to ED via POV from home. Here with umbilical hernia. Reports redness and bleeding to site.

## 2023-01-25 DIAGNOSIS — E034 Atrophy of thyroid (acquired): Secondary | ICD-10-CM | POA: Diagnosis not present

## 2023-01-25 DIAGNOSIS — I4819 Other persistent atrial fibrillation: Secondary | ICD-10-CM | POA: Diagnosis not present

## 2023-01-25 DIAGNOSIS — I2510 Atherosclerotic heart disease of native coronary artery without angina pectoris: Secondary | ICD-10-CM | POA: Diagnosis not present

## 2023-01-25 DIAGNOSIS — I252 Old myocardial infarction: Secondary | ICD-10-CM | POA: Diagnosis not present

## 2023-01-25 DIAGNOSIS — I130 Hypertensive heart and chronic kidney disease with heart failure and stage 1 through stage 4 chronic kidney disease, or unspecified chronic kidney disease: Secondary | ICD-10-CM | POA: Diagnosis not present

## 2023-01-25 DIAGNOSIS — E782 Mixed hyperlipidemia: Secondary | ICD-10-CM | POA: Diagnosis not present

## 2023-01-25 DIAGNOSIS — E1159 Type 2 diabetes mellitus with other circulatory complications: Secondary | ICD-10-CM | POA: Diagnosis not present

## 2023-02-03 ENCOUNTER — Telehealth: Payer: Self-pay | Admitting: Cardiology

## 2023-02-03 NOTE — Telephone Encounter (Signed)
Patient c/o Palpitations:  STAT if patient reporting lightheadedness, shortness of breath, or chest pain  How long have you had palpitations/irregular HR/ Afib? Are you having the symptoms now? Her cardio mobile keep saying she is in Afib  Are you currently experiencing lightheadedness, SOB or CP? lightheaded  Do you have a history of afib (atrial fibrillation) or irregular heart rhythm?   Have you checked your BP or HR? (document readings if available): blood pressure is good  Are you experiencing any other symptoms? Feel really weak, just do not feel right- patients wants to be seen

## 2023-02-03 NOTE — Telephone Encounter (Signed)
Patient missed F/U appt.  She is calling today with continued issues.  She is set for PA on Monday the 10th and ask to be put on waiting list if there is one for Dr Servando Salina.

## 2023-02-07 NOTE — Progress Notes (Deleted)
Cardiology Office Note:  .   Date:  02/07/2023  ID:  Melissa Boyer, DOB 10/20/1955, MRN [id number] PCP: Doran Stabler, NP   [facility] Providers Cardiologist:  Thomasene Ripple, DO Electrophysiologist:  Will Jorja Loa, MD    History of Present Illness: .   Melissa Boyer is a 67 y.o. female with a past medical history of atrial fibrillation s/p abalation, OSA on CPAP, hypothyroidism, nonobstructive CAD, type 2 DM, HLD, heart failure with improved EF. Patient is followed by Dr. Servando Salina and presents today for evaluation of palpitations.   Per chart review, patient was first diagnosed with atrial fibrillation in 2019.  She was started on Eliquis for CHA2DS2-VASc score of 4. After she was first diagnosed with afib, she did not routinely follow up with cardiology. She established care with Dr. Servando Salina in 07/2020 for evaluation of afib, chest discomfort.  She underwent left/right heart catheterization on 07/18/2020 that showed 25% stenosis in ostial circumflex, 25% stenosis in mid LAD.  EF estimated at 55-65%.  Underwent echocardiogram on 07/28/2020 that showed EF 60-65%, no regional wall motion abnormalities, normal RV function.  Wore cardiac monitor in 08/2020 that showed 100% atrial fibrillation burden.  Patient triggered events associated with atrial fibrillation.  She underwent TEE guided DCCV on 10/16/2020 but had early return of A-fib.  She was referred to EP and was seen by Dr. Elberta Fortis in 10/2020.  Recommended dofetilide admission.  Patient unfortunately failed to convert to sinus rhythm.  Dofetilide was switched to amiodarone.  While on amiodarone, patient continued to have episodes of A-fib.  She underwent A-fib and flutter ablation on 10/07/2021    ROS: ***  Studies Reviewed: .        *** Risk Assessment/Calculations:   {Does this patient have ATRIAL FIBRILLATION?[phone]} No BP recorded.  {Refresh Note OR Click here to enter BP  :1}***       Physical Exam:   VS:  LMP  12/02/2002 (Approximate)    Wt Readings from Last 3 Encounters:  01/21/23 220 lb (99.8 kg)  10/05/22 233 lb (105.7 kg)  05/10/22 228 lb (103.4 kg)    GEN: Well nourished, well developed in no acute distress NECK: No JVD; No carotid bruits CARDIAC: ***RRR, no murmurs, rubs, gallops RESPIRATORY:  Clear to auscultation without rales, wheezing or rhonchi  ABDOMEN: Soft, non-tender, non-distended EXTREMITIES:  No edema; No deformity   ASSESSMENT AND PLAN: .   ***    {Are you ordering a CV Procedure (e.g. stress test, cath, DCCV, TEE, etc)?   Press F2        :[id number]}  Dispo: ***  Signed, Jonita Albee, PA-C

## 2023-02-07 NOTE — Telephone Encounter (Signed)
Pt has been added to wait list.

## 2023-02-10 DIAGNOSIS — H8110 Benign paroxysmal vertigo, unspecified ear: Secondary | ICD-10-CM | POA: Diagnosis not present

## 2023-02-10 DIAGNOSIS — I951 Orthostatic hypotension: Secondary | ICD-10-CM | POA: Diagnosis not present

## 2023-02-10 DIAGNOSIS — I4819 Other persistent atrial fibrillation: Secondary | ICD-10-CM | POA: Diagnosis not present

## 2023-02-10 DIAGNOSIS — E1122 Type 2 diabetes mellitus with diabetic chronic kidney disease: Secondary | ICD-10-CM | POA: Diagnosis not present

## 2023-02-14 ENCOUNTER — Ambulatory Visit: Payer: HMO | Admitting: Cardiology

## 2023-02-16 ENCOUNTER — Encounter: Payer: Self-pay | Admitting: Cardiology

## 2023-02-17 MED ORDER — SACUBITRIL-VALSARTAN 24-26 MG PO TABS: 1.0000 | ORAL_TABLET | Freq: Two times a day (BID) | ORAL | 6 refills | Status: DC

## 2023-03-15 ENCOUNTER — Encounter: Payer: Self-pay | Admitting: Cardiology

## 2023-03-15 ENCOUNTER — Ambulatory Visit: Payer: HMO | Attending: Cardiology | Admitting: Cardiology

## 2023-03-15 VITALS — BP 102/68 | HR 61 | Ht 66.0 in | Wt 234.0 lb

## 2023-03-15 DIAGNOSIS — I10 Essential (primary) hypertension: Secondary | ICD-10-CM

## 2023-03-15 DIAGNOSIS — I480 Paroxysmal atrial fibrillation: Secondary | ICD-10-CM | POA: Diagnosis not present

## 2023-03-15 DIAGNOSIS — I2510 Atherosclerotic heart disease of native coronary artery without angina pectoris: Secondary | ICD-10-CM | POA: Diagnosis not present

## 2023-03-15 MED ORDER — FENOFIBRATE 145 MG PO TABS: 145.0000 mg | ORAL_TABLET | Freq: Every day | ORAL | 3 refills | Status: DC

## 2023-03-15 MED ORDER — ENTRESTO 49-51 MG PO TABS: 1.0000 | ORAL_TABLET | Freq: Two times a day (BID) | ORAL | 3 refills | Status: DC

## 2023-03-15 MED ORDER — AMIODARONE HCL 200 MG PO TABS: ORAL_TABLET | ORAL | 3 refills | Status: DC

## 2023-03-15 NOTE — Progress Notes (Signed)
Cardiology Office Note:    Date:  03/15/2023   ID:  Trinetta, Freytes August 15, 1955, MRN [id number]  PCP:  Doran Stabler, NP  Cardiologist:  Thomasene Ripple, DO  Electrophysiologist:  Regan Lemming, MD   Referring MD: Mikki Santee Key, *   'I am doing well"  History of Present Illness:    BERKLEIGH IMHOLTE is a 67 y.o. female with a hx of diabetes mellitus, hyperlipidemia, atrial fibrillation on Eliquis which was diagnosed in 2019 status post ablation,  sleep apnea on CPAP, hypothyroidism, former smoker, coronary artery disease nonobstructive per her recent cardiac catheterization, recent depresssed EF 30-35% which has now recovered with EF58% on cardiac MR.    She reports feeling exhausted and dizzy, symptoms that have been persistent for a couple of weeks. The patient's blood sugar has been fluctuating, but she has recently started using a new device that has helped her monitor her blood sugar more effectively throughout the day. The patient has undergone cardioversion for her AFib multiple times in the past, but is hesitant to undergo the procedure again due to the associated risks and her past experiences. The patient also reports high triglyceride levels, despite her efforts to control her diet and her current medication regimen.   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   Hypercholesteremia    Hypertension    Left renal mass 12/29/2019   Mixed hyperlipidemia 02/27/2019   Obesity (BMI 35.0-39.9  without comorbidity) 12/29/2019   Obstructive sleep apnea on CPAP 06/19/2019   Paroxysmal atrial fibrillation (HCC) 02/27/2019   Pure hypercholesterolemia 01/01/2014   Thyroid disease    hypothyroid   Unspecified hypothyroidism 01/01/2014    Past Surgical History:  Procedure Laterality Date   ATRIAL FIBRILLATION ABLATION N/A 10/07/2021   Procedure: ATRIAL FIBRILLATION ABLATION;  Surgeon: Regan Lemming, MD;  Location: [facility] INVASIVE CV LAB;  Service: Cardiovascular;  Laterality: N/A;   BUBBLE STUDY  10/16/2020   Procedure: BUBBLE STUDY;  Surgeon: Parke Poisson, MD;  Location: [facility] ENDOSCOPY;  Service: Cardiovascular;;   CARDIOVERSION N/A 10/16/2020   Procedure: CARDIOVERSION;  Surgeon: Parke Poisson, MD;  Location: [facility] ENDOSCOPY;  Service: Cardiovascular;  Laterality: N/A;   CARDIOVERSION N/A 12/04/2020   Procedure: CARDIOVERSION;  Surgeon: Jodelle Red, MD;  Location: [facility] ENDOSCOPY;  Service: Cardiovascular;  Laterality: N/A;   CARDIOVERSION N/A 01/14/2021   Procedure: CARDIOVERSION (CATH LAB);  Surgeon: Regan Lemming, MD;  Location: [facility] INVASIVE CV LAB;  Service: Cardiovascular;  Laterality: N/A;   COLPOSCOPY  10/98   normal, no biopsy   ENDOMETRIAL ABLATION  12/02/2002   ENDOMETRIAL BIOPSY  5/03   IR RADIOLOGIST EVAL & MGMT  11/05/2020   RIGHT/LEFT HEART CATH AND CORONARY ANGIOGRAPHY N/A 07/18/2020   Procedure: RIGHT/LEFT HEART CATH AND CORONARY ANGIOGRAPHY;  Surgeon: Corky Crafts, MD;  Location: [facility] INVASIVE CV LAB;  Service: Cardiovascular;  Laterality: N/A;   TEE WITHOUT CARDIOVERSION N/A 10/16/2020  Procedure: TRANSESOPHAGEAL ECHOCARDIOGRAM (TEE);  Surgeon: Parke Poisson, MD;  Location: [facility] ENDOSCOPY;  Service: Cardiovascular;  Laterality: N/A;   THYROIDECTOMY, PARTIAL     thyroid nodule excision left side    Current Medications: Current Meds  Medication Sig   acetaminophen (TYLENOL) 650 MG CR tablet Take 1,300 mg by mouth every 8 (eight) hours as needed  for pain.   amiodarone (PACERONE) 200 MG tablet Take 400 mg (2 tablets) twice daily for 2 weeks then take 200 mg (1 tablet) once daily   ASPIRIN LOW DOSE 81 MG tablet Take 81 mg by mouth at bedtime.   atorvastatin (LIPITOR) 20 MG tablet Take 20 mg by mouth every evening.   carvedilol (COREG) 3.125 MG tablet Take 1 tablet (3.125 mg total) by mouth 2 (two) times daily.   cephALEXin (KEFLEX) 500 MG capsule Take 1 capsule (500 mg total) by mouth 2 (two) times daily.   clonazePAM (KLONOPIN) 0.5 MG tablet Take 0.5 mg by mouth 2 (two) times daily as needed.   fenofibrate (TRICOR) 145 MG tablet Take 1 tablet (145 mg total) by mouth daily.   furosemide (LASIX) 40 MG tablet Take 1 tablet (40 mg total) by mouth daily.   ketoconazole (NIZORAL) 2 % cream Apply 1 Application topically daily. Apply to bellybutton once a day for 14 days   levothyroxine (SYNTHROID, LEVOTHROID) 125 MCG tablet Take 125 mcg by mouth daily before breakfast.   magnesium oxide (MAG-OX) 400 (240 Mg) MG tablet Take 1 tablet (400 mg total) by mouth 2 (two) times daily. KEEP OV.   metFORMIN (GLUCOPHAGE) 500 MG tablet Take 500 mg by mouth 2 (two) times daily with a meal.   nitroGLYCERIN (NITROSTAT) 0.4 MG SL tablet Place 0.4 mg under the tongue every 5 (five) minutes as needed for chest pain.   sacubitril-valsartan (ENTRESTO) 49-51 MG Take 1 tablet by mouth 2 (two) times daily. (Patient taking differently: Take 0.5 tablets by mouth 2 (two) times daily.)   spironolactone (ALDACTONE) 25 MG tablet TAKE 1 TABLET(25 MG) BY MOUTH DAILY   [DISCONTINUED] sacubitril-valsartan (ENTRESTO) 24-26 MG Take 1 tablet by mouth 2 (two) times daily.     Allergies:   Tape   Social History   Socioeconomic History   Marital status: Divorced    Spouse name: Not on file   Number of children: 1   Years of education: Not on file   Highest education level: Not on file  Occupational History   Not on file  Tobacco Use   Smoking status: Former    Current  packs/day: 0.00    Average packs/day: 1 pack/day for 30.0 years (30.0 ttl pk-yrs)    Types: Cigarettes    Start date: 07/11/1973    Quit date: 07/12/2003    Years since quitting: 19.6   Smokeless tobacco: Never  Vaping Use   Vaping status: Never Used  Substance and Sexual Activity   Alcohol use: Never   Drug use: No   Sexual activity: Yes    Partners: Male    Birth control/protection: Post-menopausal  Other Topics Concern   Not on file  Social History Narrative   Not on file   Social Determinants of Health   Financial Resource Strain: Not on file  Food Insecurity: Not on file  Transportation Needs: Not on file  Physical Activity: Not on file  Stress: Not on file  Social Connections: Not on file     Family History: The patient's family history includes Cancer in her maternal aunt,  mother, and sister; Diabetes in her maternal grandfather; Emphysema in her father; Heart attack in her maternal aunt and maternal grandmother; Hypertension in her father.  ROS:   Review of Systems  Constitution: Negative for decreased appetite, fever and weight gain.  HENT: Negative for congestion, ear discharge, hoarse voice and sore throat.   Eyes: Negative for discharge, redness, vision loss in right eye and visual halos.  Cardiovascular: Negative for chest pain, dyspnea on exertion, leg swelling, orthopnea and palpitations.  Respiratory: Negative for cough, hemoptysis, shortness of breath and snoring.   Endocrine: Negative for heat intolerance and polyphagia.  Hematologic/Lymphatic: Negative for bleeding problem. Does not bruise/bleed easily.  Skin: Negative for flushing, nail changes, rash and suspicious lesions.  Musculoskeletal: Negative for arthritis, joint pain, muscle cramps, myalgias, neck pain and stiffness.  Gastrointestinal: Negative for abdominal pain, bowel incontinence, diarrhea and excessive appetite.  Genitourinary: Negative for decreased libido, genital sores and incomplete  emptying.  Neurological: Negative for brief paralysis, focal weakness, headaches and loss of balance.  Psychiatric/Behavioral: Negative for altered mental status, depression and suicidal ideas.  Allergic/Immunologic: Negative for HIV exposure and persistent infections.    EKGs/Labs/Other Studies Reviewed:    The following studies were reviewed today:   EKG: EKG showed evidence of 4:1 atrial flutter  Zio monitor 03/15/2022 Patch Wear Time:  16 days and 2 hours (2023-10-09T06:16:03-399 to 2023-10-29T17:24:17-0400) Monitor 1 Atrial Flutter occurred continuously (100% burden), ranging from 48-88 bpm (avg of 69 bpm). Atrial Flutter may be possible Atrial Tachycardia with variable block. Isolated VEs were rare (<1.0%), and no VE Couplets or VE Triplets were present. Monitor 2 Atrial Flutter occurred continuously (100% burden), ranging from 42-128 bpm (avg of 68 bpm). Isolated VEs were rare (<1.0%), and no VE Couplets or VE Triplets were present.  Conclusion: There is evidence of continuous atrial flutter.  Recent Labs: 04/22/2022: B Natriuretic Peptide 12.8 01/21/2023: ALT 21; BUN 35; Creatinine, Ser 1.23; Hemoglobin 13.2; Platelets 219; Potassium 4.2; Sodium 135  Recent Lipid Panel No results found for: "CHOL", "TRIG", "HDL", "CHOLHDL", "VLDL", "LDLCALC", "LDLDIRECT"  Physical Exam:    VS:  BP 102/68 (BP Location: Left Arm, Patient Position: Sitting, Cuff Size: Large)   Pulse 61   Ht 5\' 6"  (1.676 m)   Wt 234 lb (106.1 kg)   LMP 12/02/2002 (Approximate)   BMI 37.77 kg/m     Wt Readings from Last 3 Encounters:  03/15/23 234 lb (106.1 kg)  01/21/23 220 lb (99.8 kg)  10/05/22 233 lb (105.7 kg)     GEN: Well nourished, well developed in no acute distress HEENT: Normal NECK: No JVD; No carotid bruits LYMPHATICS: No lymphadenopathy CARDIAC: S1S2 noted,RRR, no murmurs, rubs, gallops RESPIRATORY:  Clear to auscultation without rales, wheezing or rhonchi  ABDOMEN: Soft, non-tender,  non-distended, +bowel sounds, no guarding. EXTREMITIES: No edema, No cyanosis, no clubbing MUSCULOSKELETAL:  No deformity  SKIN: Warm and dry NEUROLOGIC:  Alert and oriented x 3, non-focal PSYCHIATRIC:  Normal affect, good insight  ASSESSMENT:    1. Essential hypertension   2. Paroxysmal atrial fibrillation (HCC)   3. Hypertension, unspecified type   4. Mild CAD    PLAN:    Paroxysmal Atrial Fibrillation Persistent symptoms of fatigue and dizziness. Patient has a history of multiple cardioversions and an ablation. Discussed the risks/benefits of another cardioversion and the use of antiarrhythmics.  She has declined cardioversion for now okay with starting antiarrhythmics -Start Amiodarone 400mg  twice daily for two weeks, then reduce to 200mg  daily. -Consider cardioversion  at 12-week follow-up if not in sinus rhythm.  Hypertriglyceridemia Triglyceride level significantly elevated at 262. Discussed the risks associated with high triglycerides in the context of cardiovascular disease and the need for additional treatment. -Start Fenofibrate and monitor for side effects. -Recheck triglyceride level at 12-week follow-up.  Heart Failure with improved ejection fraction Continue current medical regimen  CAD - no anginal symptoms. Patient is on anticoagulation for atrial fibrillation. Discussed the increased risk of bleeding with concurrent aspirin use. -Discontinue Aspirin.  Follow-up in 12 weeks to assess response to Amiodarone and Fenofibrate, and to consider cardioversion if still in atrial fibrillation.   The patient is in agreement with the above plan. The patient left the office in stable condition.  The patient will follow up in   Medication Adjustments/Labs and Tests Ordered: Current medicines are reviewed at length with the patient today.  Concerns regarding medicines are outlined above.  Orders Placed This Encounter  Procedures   EKG 12-Lead   Meds ordered this  encounter  Medications   sacubitril-valsartan (ENTRESTO) 49-51 MG    Sig: Take 1 tablet by mouth 2 (two) times daily.    Dispense:  60 tablet    Refill:  3   amiodarone (PACERONE) 200 MG tablet    Sig: Take 400 mg (2 tablets) twice daily for 2 weeks then take 200 mg (1 tablet) once daily    Dispense:  90 tablet    Refill:  3    After initial fill remove "Take 400 mg (2 tablets) twice daily for 2 weeks then "   fenofibrate (TRICOR) 145 MG tablet    Sig: Take 1 tablet (145 mg total) by mouth daily.    Dispense:  90 tablet    Refill:  3    Patient Instructions  Medication Instructions:  Your physician has recommended you make the following change in your medication:  STOP: Aspirin START: Amiodarone 400 mg twice daily for 2 weeks than 200 mg once daily START: Fenofibrate 145 mg once daily  *If you need a refill on your cardiac medications before your next appointment, please call your pharmacy*   Lab Work: None    Testing/Procedures: None   Follow-Up: At [facility], you and your health needs are our priority.  As part of our continuing mission to provide you with exceptional heart care, we have created designated Provider Care Teams.  These Care Teams include your primary Cardiologist (physician) and Advanced Practice Providers (APPs -  Physician Assistants and Nurse Practitioners) who all work together to provide you with the care you need, when you need it.   Your next appointment:   12 week(s)  Provider:   Thomasene Ripple, DO     Adopting a Healthy Lifestyle.  Know what a healthy weight is for you (roughly BMI <25) and aim to maintain this   Aim for 7+ servings of fruits and vegetables daily   65-80+ fluid ounces of water or unsweet tea for healthy kidneys   Limit to max 1 drink of alcohol per day; avoid smoking/tobacco   Limit animal fats in diet for cholesterol and heart health - choose grass fed whenever available   Avoid highly processed foods, and  foods high in saturated/trans fats   Aim for low stress - take time to unwind and care for your mental health   Aim for 150 min of moderate intensity exercise weekly for heart health, and weights twice weekly for bone health   Aim for 7-9 hours of  sleep daily   When it comes to diets, agreement about the perfect plan isnt easy to find, even among the experts. Experts at the [facility] of [employer] developed an idea known as the Healthy Eating Plate. Just imagine a plate divided into logical, healthy portions.   The emphasis is on diet quality:   Load up on vegetables and fruits - one-half of your plate: Aim for color and variety, and remember that potatoes dont count.   Go for whole grains - one-quarter of your plate: Whole wheat, barley, wheat berries, quinoa, oats, brown rice, and foods made with them. If you want pasta, go with whole wheat pasta.   Protein power - one-quarter of your plate: Fish, chicken, beans, and nuts are all healthy, versatile protein sources. Limit red meat.   The diet, however, does go beyond the plate, offering a few other suggestions.   Use healthy plant oils, such as olive, canola, soy, corn, sunflower and peanut. Check the labels, and avoid partially hydrogenated oil, which have unhealthy trans fats.   If youre thirsty, drink water. Coffee and tea are good in moderation, but skip sugary drinks and limit milk and dairy products to one or two daily servings.   The type of carbohydrate in the diet is more important than the amount. Some sources of carbohydrates, such as vegetables, fruits, whole grains, and beans-are healthier than others.   Finally, stay active  Signed, Thomasene Ripple, DO  03/15/2023 12:00 PM    [facility]   Medication Adjustments/Labs and Tests Ordered: Current medicines are reviewed at length with the patient today.  Concerns regarding medicines are outlined above.   Tests Ordered: Orders Placed  This Encounter  Procedures   EKG 12-Lead    Medication Changes: Meds ordered this encounter  Medications   sacubitril-valsartan (ENTRESTO) 49-51 MG    Sig: Take 1 tablet by mouth 2 (two) times daily.    Dispense:  60 tablet    Refill:  3   amiodarone (PACERONE) 200 MG tablet    Sig: Take 400 mg (2 tablets) twice daily for 2 weeks then take 200 mg (1 tablet) once daily    Dispense:  90 tablet    Refill:  3    After initial fill remove "Take 400 mg (2 tablets) twice daily for 2 weeks then "   fenofibrate (TRICOR) 145 MG tablet    Sig: Take 1 tablet (145 mg total) by mouth daily.    Dispense:  90 tablet    Refill:  3    Follow Up:  In Person in 6 week(s)  Signed, Thomasene Ripple, DO  03/15/2023 12:00 PM    [facility]

## 2023-03-15 NOTE — Patient Instructions (Signed)
Medication Instructions:  Your physician has recommended you make the following change in your medication:  STOP: Aspirin START: Amiodarone 400 mg twice daily for 2 weeks than 200 mg once daily START: Fenofibrate 145 mg once daily  *If you need a refill on your cardiac medications before your next appointment, please call your pharmacy*   Lab Work: None    Testing/Procedures: None   Follow-Up: At [facility], you and your health needs are our priority.  As part of our continuing mission to provide you with exceptional heart care, we have created designated Provider Care Teams.  These Care Teams include your primary Cardiologist (physician) and Advanced Practice Providers (APPs -  Physician Assistants and Nurse Practitioners) who all work together to provide you with the care you need, when you need it.   Your next appointment:   12 week(s)  Provider:   Thomasene Ripple, DO

## 2023-03-16 ENCOUNTER — Encounter: Payer: Self-pay | Admitting: Cardiology

## 2023-03-16 DIAGNOSIS — I480 Paroxysmal atrial fibrillation: Secondary | ICD-10-CM

## 2023-03-28 ENCOUNTER — Telehealth: Payer: Self-pay | Admitting: Cardiology

## 2023-03-28 NOTE — Telephone Encounter (Signed)
Patient called and said that Dr. Servando Salina wants her to have a cardioversion done and said that she only wants Dr. Servando Salina or Dr. Elberta Fortis to do it since he's used doing it for her. Please call back to verify

## 2023-03-28 NOTE — Telephone Encounter (Signed)
Patient is scheduled for a Cardioversion with Dr Servando Salina on 12/12. Patient stated that Dr Elberta Fortis has done all 4 of  her previous 4 Cardioversion and wants to know if he is still doing. I advised patient when I called to schedule her appointment he was not listed. Patient would like to verify he is no longer doing procedure, if he is she would like to schedule with him. Please advise.

## 2023-03-30 NOTE — Telephone Encounter (Signed)
Called and spoke to patient. Below message relayed per Dr Elberta Fortis.   Camnitz, Will Daphine Deutscher, MD   I am not doing cardioversions on a regularly scheduled basis unless patient has failed with a prior cardiologist.  Now that we have upgraded our defibrillators in the cardioversion suite, Dr. Servando Salina should be able to get the patient into normal rhythm.

## 2023-04-07 ENCOUNTER — Other Ambulatory Visit: Payer: Self-pay

## 2023-04-07 DIAGNOSIS — I480 Paroxysmal atrial fibrillation: Secondary | ICD-10-CM

## 2023-04-08 LAB — CBC WITH DIFFERENTIAL/PLATELET
Basophils Absolute: 0.1 10*3/uL (ref 0.0–0.2)
Basos: 1 %
EOS (ABSOLUTE): 0.2 10*3/uL (ref 0.0–0.4)
Eos: 3 %
Hematocrit: 43.3 % (ref 34.0–46.6)
Hemoglobin: 14.4 g/dL (ref 11.1–15.9)
Immature Grans (Abs): 0 10*3/uL (ref 0.0–0.1)
Immature Granulocytes: 1 %
Lymphocytes Absolute: 2.7 10*3/uL (ref 0.7–3.1)
Lymphs: 37 %
MCH: 31.1 pg (ref 26.6–33.0)
MCHC: 33.3 g/dL (ref 31.5–35.7)
MCV: 94 fL (ref 79–97)
Monocytes Absolute: 0.6 10*3/uL (ref 0.1–0.9)
Monocytes: 8 %
Neutrophils Absolute: 3.7 10*3/uL (ref 1.4–7.0)
Neutrophils: 50 %
Platelets: 258 10*3/uL (ref 150–450)
RBC: 4.63 x10E6/uL (ref 3.77–5.28)
RDW: 12.5 % (ref 11.7–15.4)
WBC: 7.3 10*3/uL (ref 3.4–10.8)

## 2023-04-08 LAB — BASIC METABOLIC PANEL
BUN/Creatinine Ratio: 21 (ref 12–28)
BUN: 27 mg/dL (ref 8–27)
CO2: 20 mmol/L (ref 20–29)
Calcium: 10 mg/dL (ref 8.7–10.3)
Chloride: 101 mmol/L (ref 96–106)
Creatinine, Ser: 1.3 mg/dL — ABNORMAL HIGH (ref 0.57–1.00)
Glucose: 130 mg/dL — ABNORMAL HIGH (ref 70–99)
Potassium: 4.8 mmol/L (ref 3.5–5.2)
Sodium: 140 mmol/L (ref 134–144)
eGFR: 45 mL/min/{1.73_m2} — ABNORMAL LOW (ref >59)

## 2023-04-12 ENCOUNTER — Other Ambulatory Visit: Payer: Self-pay | Admitting: Cardiology

## 2023-04-13 NOTE — OR Nursing (Signed)
Called patient with pre-procedure instructions for tomorrow.   Patient informed of:   Time to arrive for procedure. 0730 Remain NPO past midnight.  Must have a ride home and a responsible adult to remain with them for 24 ours post procedure.  Confirmed blood thinner. Eliquis. Confirmed no breaks in taking blood thinner for 3+ weeks prior to procedure. Confirmed patient stopped all GLP-1s and GLP-2s for at least one week before procedure.   Left message for patient regarding above information. Instructed to call back with any questions.

## 2023-04-14 ENCOUNTER — Ambulatory Visit (HOSPITAL_COMMUNITY): Payer: HMO | Admitting: Anesthesiology

## 2023-04-14 ENCOUNTER — Other Ambulatory Visit: Payer: Self-pay

## 2023-04-14 ENCOUNTER — Encounter (HOSPITAL_COMMUNITY)
Admission: RE | Disposition: A | Discharge status: Home / Self Care | Payer: Self-pay | Source: Home / Self Care | Attending: Cardiology

## 2023-04-14 ENCOUNTER — Ambulatory Visit (HOSPITAL_COMMUNITY)
Admission: RE | Admit: 2023-04-14 | Discharge: 2023-04-14 | Disposition: A | Discharge status: Home / Self Care | Payer: HMO | Attending: Cardiology | Admitting: Cardiology

## 2023-04-14 ENCOUNTER — Encounter (HOSPITAL_COMMUNITY): Payer: Self-pay | Admitting: Cardiology

## 2023-04-14 DIAGNOSIS — E119 Type 2 diabetes mellitus without complications: Secondary | ICD-10-CM | POA: Diagnosis not present

## 2023-04-14 DIAGNOSIS — I110 Hypertensive heart disease with heart failure: Secondary | ICD-10-CM | POA: Insufficient documentation

## 2023-04-14 DIAGNOSIS — I480 Paroxysmal atrial fibrillation: Secondary | ICD-10-CM | POA: Insufficient documentation

## 2023-04-14 DIAGNOSIS — I4891 Unspecified atrial fibrillation: Secondary | ICD-10-CM | POA: Diagnosis not present

## 2023-04-14 DIAGNOSIS — Z87891 Personal history of nicotine dependence: Secondary | ICD-10-CM | POA: Diagnosis not present

## 2023-04-14 DIAGNOSIS — Z7901 Long term (current) use of anticoagulants: Secondary | ICD-10-CM | POA: Insufficient documentation

## 2023-04-14 DIAGNOSIS — I509 Heart failure, unspecified: Secondary | ICD-10-CM | POA: Insufficient documentation

## 2023-04-14 DIAGNOSIS — I10 Essential (primary) hypertension: Secondary | ICD-10-CM | POA: Diagnosis not present

## 2023-04-14 DIAGNOSIS — E782 Mixed hyperlipidemia: Secondary | ICD-10-CM | POA: Diagnosis not present

## 2023-04-14 HISTORY — PX: CARDIOVERSION: EP1203

## 2023-04-14 SURGERY — CARDIOVERSION (CATH LAB)
Anesthesia: Monitor Anesthesia Care

## 2023-04-14 MED ORDER — PROPOFOL 10 MG/ML IV BOLUS
INTRAVENOUS | Status: DC | PRN
  Administered 2023-04-14: 50 mg via INTRAVENOUS
  Administered 2023-04-14: 80 mg via INTRAVENOUS

## 2023-04-14 SURGICAL SUPPLY — 1 items: PAD DEFIB RADIO PHYSIO CONN (PAD) ×1 IMPLANT

## 2023-04-14 NOTE — Transfer of Care (Signed)
Immediate Anesthesia Transfer of Care Note  Patient: Melissa Boyer  Procedure(s) Performed: CARDIOVERSION (CATH LAB)  Patient Location:  [facility] lab  Anesthesia Type:MAC  Level of Consciousness: sedated  Airway & Oxygen Therapy: Patient Spontanous Breathing and Patient connected to nasal cannula oxygen  Post-op Assessment: Report given to RN and Post -op Vital signs reviewed and stable  Post vital signs: Reviewed and stable  Last Vitals:  Vitals Value Taken Time  BP 115/81 04/14/23 0848  Temp    Pulse 59 04/14/23 0848  Resp 12   SpO2 96 %   Vitals shown include unfiled device data.  Last Pain:  Vitals:   04/14/23 0700  TempSrc: Temporal  PainSc: 0-No pain         Complications: No notable events documented.

## 2023-04-14 NOTE — H&P (Signed)
Cardiology Admission History and Physical   Patient ID: Melissa Boyer MRN: [id number]; DOB: 1955-11-16   Admission date: 04/14/2023  PCP:  Doran Stabler, NP   [facility] Providers Cardiologist:  Thomasene Ripple, DO  Electrophysiologist:  Regan Lemming, MD       Chief Complaint:  afib  Patient Profile:   Melissa Boyer is a 67 y.o. female with history of hypertension, paroxysmal atrial fibrillation, heart failure with improved ejection fraction who is being seen 04/14/2023 here for cardioversion   History of Present Illness:   Melissa Boyer here for cardioversion   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   Hypercholesteremia    Hypertension    Left renal mass 12/29/2019   Mixed hyperlipidemia 02/27/2019   Obesity (BMI 35.0-39.9 without comorbidity) 12/29/2019   Obstructive sleep apnea on CPAP 06/19/2019   Paroxysmal atrial fibrillation (HCC) 02/27/2019   Pure hypercholesterolemia 01/01/2014   Thyroid disease    hypothyroid   Unspecified hypothyroidism 01/01/2014    Past Surgical History:  Procedure Laterality Date   ATRIAL FIBRILLATION ABLATION N/A 10/07/2021   Procedure: ATRIAL FIBRILLATION ABLATION;  Surgeon: Regan Lemming, MD;  Location: [facility] INVASIVE CV LAB;  Service: Cardiovascular;  Laterality: N/A;   BUBBLE STUDY  10/16/2020   Procedure: BUBBLE STUDY;  Surgeon: Parke Poisson, MD;  Location: [facility] ENDOSCOPY;  Service: Cardiovascular;;   CARDIOVERSION N/A 10/16/2020    Procedure: CARDIOVERSION;  Surgeon: Parke Poisson, MD;  Location: [facility] ENDOSCOPY;  Service: Cardiovascular;  Laterality: N/A;   CARDIOVERSION N/A 12/04/2020   Procedure: CARDIOVERSION;  Surgeon: Jodelle Red, MD;  Location: [facility] ENDOSCOPY;  Service: Cardiovascular;  Laterality: N/A;   CARDIOVERSION N/A 01/14/2021   Procedure: CARDIOVERSION (CATH LAB);  Surgeon: Regan Lemming, MD;  Location: [facility] INVASIVE CV LAB;  Service: Cardiovascular;  Laterality: N/A;   COLPOSCOPY  10/98   normal, no biopsy   ENDOMETRIAL ABLATION  12/02/2002   ENDOMETRIAL BIOPSY  5/03   IR RADIOLOGIST EVAL & MGMT  11/05/2020   RIGHT/LEFT HEART CATH AND CORONARY ANGIOGRAPHY N/A 07/18/2020   Procedure: RIGHT/LEFT HEART CATH AND CORONARY ANGIOGRAPHY;  Surgeon: Corky Crafts, MD;  Location: [facility] INVASIVE CV LAB;  Service: Cardiovascular;  Laterality: N/A;   TEE WITHOUT CARDIOVERSION N/A 10/16/2020   Procedure: TRANSESOPHAGEAL ECHOCARDIOGRAM (TEE);  Surgeon: Parke Poisson, MD;  Location: [facility] ENDOSCOPY;  Service: Cardiovascular;  Laterality: N/A;   THYROIDECTOMY, PARTIAL     thyroid nodule excision left side     Medications Prior to Admission: Prior to Admission medications   Medication Sig Start Date End Date Taking? Authorizing Provider  acetaminophen (TYLENOL) 650 MG CR tablet Take 1,300 mg by mouth in the morning and at bedtime.   Yes [provider]  amiodarone (PACERONE) 200 MG tablet Take 400 mg (2 tablets) twice daily for 2 weeks then take 200 mg (1 tablet) once daily Patient taking differently: Take 200 mg by mouth  2 (two) times daily. 03/15/23  Yes Tedrick Port, DO  apixaban (ELIQUIS) 5 MG TABS tablet Take 1 tablet (5 mg total) by mouth 2 (two) times daily. 08/28/21 04/10/25 Yes Revankar, Aundra Dubin, MD  atorvastatin (LIPITOR) 20 MG tablet Take 20 mg by mouth every evening.   Yes [provider]  carvedilol (COREG) 3.125 MG tablet Take 1 tablet (3.125 mg total) by mouth 2 (two) times  daily. 08/18/22  Yes Devine Dant, DO  FARXIGA 10 MG TABS tablet Take 10 mg by mouth daily. 03/30/23  Yes [provider]  furosemide (LASIX) 40 MG tablet TAKE 1 TABLET(40 MG) BY MOUTH DAILY 04/12/23  Yes Tawania Daponte, DO  levothyroxine (SYNTHROID, LEVOTHROID) 125 MCG tablet Take 125 mcg by mouth daily before breakfast. 12/11/13  Yes [provider]  metFORMIN (GLUCOPHAGE) 500 MG tablet Take 1,000 mg by mouth 2 (two) times daily with a meal.   Yes [provider]  sacubitril-valsartan (ENTRESTO) 49-51 MG Take 1 tablet by mouth 2 (two) times daily. Patient taking differently: Take 0.5 tablets by mouth 2 (two) times daily. 03/15/23  Yes Izabellah Dadisman, DO  spironolactone (ALDACTONE) 25 MG tablet TAKE 1 TABLET(25 MG) BY MOUTH DAILY 01/10/23  Yes Rainee Sweatt, DO  clonazePAM (KLONOPIN) 0.5 MG tablet Take 0.25 mg by mouth 2 (two) times daily as needed for anxiety. 02/17/22   [provider]  fenofibrate (TRICOR) 145 MG tablet Take 1 tablet (145 mg total) by mouth daily. 03/15/23   Nivek Powley, DO  nitroGLYCERIN (NITROSTAT) 0.4 MG SL tablet Place 0.4 mg under the tongue every 5 (five) minutes x 3 doses as needed for chest pain.    [provider]     Allergies:    Allergies  Allergen Reactions   Tape     Adhesives  Paper tape takes off skin      Social History:   Social History   Socioeconomic History   Marital status: Divorced    Spouse name: Not on file   Number of children: 1   Years of education: Not on file   Highest education level: Not on file  Occupational History   Not on file  Tobacco Use   Smoking status: Former    Current packs/day: 0.00    Average packs/day: 1 pack/day for 30.0 years (30.0 ttl pk-yrs)    Types: Cigarettes    Start date: 07/11/1973    Quit date: 07/12/2003    Years since quitting: 19.7   Smokeless tobacco: Never  Vaping Use   Vaping status: Never Used  Substance and Sexual Activity   Alcohol use: Never   Drug  use: No   Sexual activity: Yes    Partners: Male    Birth control/protection: Post-menopausal  Other Topics Concern   Not on file  Social History Narrative   Not on file   Social Drivers of Health   Financial Resource Strain: Not on file  Food Insecurity: Not on file  Transportation Needs: Not on file  Physical Activity: Not on file  Stress: Not on file  Social Connections: Not on file  Intimate Partner Violence: Not on file    Family History:   The patient's family history includes Cancer in her maternal aunt, mother, and sister; Diabetes in her maternal grandfather; Emphysema in her father; Heart attack in her maternal aunt and maternal grandmother; Hypertension in her father.    ROS:  Please see the history of present illness.  All other ROS reviewed and negative.  Physical Exam/Data:   Vitals:   04/14/23 0700  BP: 126/79  Pulse: (!) 55  Resp: 18  Temp: 97.7 F (36.5 C)  TempSrc: Temporal  SpO2: 97%  Weight: 100.7 kg  Height: 5\' 6"  (1.676 m)   No intake or output data in the 24 hours ending 04/14/23 0738    04/14/2023    7:00 AM 03/15/2023    8:52 AM 01/21/2023    2:43 PM  Last 3 Weights  Weight (lbs) 222 lb 234 lb 220 lb  Weight (kg) 100.699 kg 106.142 kg 99.791 kg     Body mass index is 35.83 kg/m.  General:  Well nourished, well developed, in no acute distress HEENT: normal Neck: no JVD Vascular: No carotid bruits; Distal pulses 2+ bilaterally   Cardiac:  normal S1, S2; RRR; no murmur  Lungs:  clear to auscultation bilaterally, no wheezing, rhonchi or rales  Abd: soft, nontender, no hepatomegaly  Ext: no edema Musculoskeletal:  No deformities, BUE and BLE strength normal and equal Skin: warm and dry  Neuro:  CNs 2-12 intact, no focal abnormalities noted Psych:  Normal affect    EKG:  The ECG that was done  was personally reviewed and demonstrates atrial fibrillation  Relevant CV Studies: echo  Laboratory Data:  High Sensitivity  Troponin:  No results for input(s): "TROPONINIHS" in the last 720 hours.    Chemistry Recent Labs  Lab 04/07/23 1358  NA 140  K 4.8  CL 101  CO2 20  GLUCOSE 130*  BUN 27  CREATININE 1.30*  CALCIUM 10.0    No results for input(s): "PROT", "ALBUMIN", "AST", "ALT", "ALKPHOS", "BILITOT" in the last 168 hours. Lipids No results for input(s): "CHOL", "TRIG", "HDL", "LABVLDL", "LDLCALC", "CHOLHDL" in the last 168 hours. Hematology Recent Labs  Lab 04/07/23 1405  WBC 7.3  RBC 4.63  HGB 14.4  HCT 43.3  MCV 94  MCH 31.1  MCHC 33.3  RDW 12.5  PLT 258   Thyroid No results for input(s): "TSH", "FREET4" in the last 168 hours. BNPNo results for input(s): "BNP", "PROBNP" in the last 168 hours.  DDimer No results for input(s): "DDIMER" in the last 168 hours.   Radiology/Studies:  No results found.   Assessment and Plan:   Paroxysmal atrial fibrillation-plan for cardioversion today.  She has taking anticoagulation for at least 3 weeks without interruption.  Informed Consent   Shared Decision Making/Informed Consent The risks (stroke, cardiac arrhythmias rarely resulting in the need for a temporary or permanent pacemaker, skin irritation or burns and complications associated with conscious sedation including aspiration, arrhythmia, respiratory failure and death), benefits (restoration of normal sinus rhythm) and alternatives of a direct current cardioversion were explained in detail to Melissa Boyer and she agrees to proceed.         Risk Assessment/Risk Scores:         CHA2DS2-VASc Score =     This indicates a  % annual risk of stroke. The patient's score is based upon:      Code Status: Full Code  Severity of Illness: Here for procedure    For questions or updates, please contact  [facility] Please consult [email or web address] for contact info under     SignedThomasene Ripple, DO  04/14/2023 7:38 AM

## 2023-04-14 NOTE — Discharge Instructions (Signed)

## 2023-04-14 NOTE — Anesthesia Preprocedure Evaluation (Signed)
Anesthesia Evaluation  Patient identified by MRN, date of birth, ID band Patient awake    Reviewed: Allergy & Precautions, H&P , NPO status , Patient's Chart, lab work & pertinent test results  Airway Mallampati: II   Neck ROM: full    Dental   Pulmonary former smoker   breath sounds clear to auscultation       Cardiovascular hypertension, + CAD  + dysrhythmias Atrial Fibrillation  Rhythm:irregular Rate:Normal     Neuro/Psych    GI/Hepatic   Endo/Other  diabetes, Type 2Hypothyroidism    Renal/GU Renal InsufficiencyRenal disease     Musculoskeletal   Abdominal   Peds  Hematology   Anesthesia Other Findings   Reproductive/Obstetrics                             Anesthesia Physical Anesthesia Plan  ASA: 3  Anesthesia Plan: General   Post-op Pain Management:    Induction: Intravenous  PONV Risk Score and Plan: 3 and Treatment may vary due to age or medical condition and Propofol infusion  Airway Management Planned: Mask  Additional Equipment:   Intra-op Plan:   Post-operative Plan:   Informed Consent: I have reviewed the patients History and Physical, chart, labs and discussed the procedure including the risks, benefits and alternatives for the proposed anesthesia with the patient or authorized representative who has indicated his/her understanding and acceptance.     Dental advisory given  Plan Discussed with: CRNA, Anesthesiologist and Surgeon  Anesthesia Plan Comments:        Anesthesia Quick Evaluation

## 2023-04-15 NOTE — Anesthesia Postprocedure Evaluation (Addendum)
Anesthesia Post Note  Patient: Cornie P O'Grady  Procedure(s) Performed: CARDIOVERSION (CATH LAB)     Patient location during evaluation: PACU Anesthesia Type: MAC Level of consciousness: awake and alert Pain management: pain level controlled Vital Signs Assessment: post-procedure vital signs reviewed and stable Respiratory status: spontaneous breathing, nonlabored ventilation, respiratory function stable and patient connected to nasal cannula oxygen Cardiovascular status: stable and blood pressure returned to baseline Postop Assessment: no apparent nausea or vomiting Anesthetic complications: no   No notable events documented.  Last Vitals:  Vitals:   04/14/23 0910 04/14/23 0920  BP: 114/72 121/68  Pulse: (!) 56 (!) 58  Resp: 16 20  Temp:    SpO2: 95% 97%    Last Pain:  Vitals:   04/14/23 0848  TempSrc: Temporal  PainSc: 0-No pain                 Leeland Lovelady S

## 2023-04-21 ENCOUNTER — Other Ambulatory Visit: Payer: Self-pay | Admitting: Cardiology

## 2023-04-21 DIAGNOSIS — Z0000 Encounter for general adult medical examination without abnormal findings: Secondary | ICD-10-CM | POA: Diagnosis not present

## 2023-04-21 DIAGNOSIS — Z9181 History of falling: Secondary | ICD-10-CM | POA: Diagnosis not present

## 2023-04-22 DIAGNOSIS — L820 Inflamed seborrheic keratosis: Secondary | ICD-10-CM | POA: Diagnosis not present

## 2023-04-25 ENCOUNTER — Telehealth: Payer: Self-pay | Admitting: Cardiology

## 2023-04-25 DIAGNOSIS — I480 Paroxysmal atrial fibrillation: Secondary | ICD-10-CM

## 2023-04-25 MED ORDER — APIXABAN 5 MG PO TABS: 5.0000 mg | ORAL_TABLET | Freq: Two times a day (BID) | ORAL | 11 refills | Status: AC

## 2023-04-25 NOTE — Telephone Encounter (Signed)
*  STAT* If patient is at the pharmacy, call can be transferred to refill team.   1. Which medications need to be refilled? (please list name of each medication and dose if known) Eliquis   2. Would you like to learn more about the convenience, safety, & potential cost savings by using the [facility]?*     3. Are you open to using the [facility] (Type [facility].   4. Which pharmacy/location (including street and city if local pharmacy) is medication to be sent to?[facility] [address] [place]   5. Do they need a 30 day or 90 day supply? 90 days and refill- please call today- been out for 3 days

## 2023-05-09 DIAGNOSIS — N1832 Chronic kidney disease, stage 3b: Secondary | ICD-10-CM | POA: Diagnosis not present

## 2023-05-09 DIAGNOSIS — E1159 Type 2 diabetes mellitus with other circulatory complications: Secondary | ICD-10-CM | POA: Diagnosis not present

## 2023-05-09 DIAGNOSIS — J0100 Acute maxillary sinusitis, unspecified: Secondary | ICD-10-CM | POA: Diagnosis not present

## 2023-05-09 DIAGNOSIS — E782 Mixed hyperlipidemia: Secondary | ICD-10-CM | POA: Diagnosis not present

## 2023-05-09 DIAGNOSIS — I130 Hypertensive heart and chronic kidney disease with heart failure and stage 1 through stage 4 chronic kidney disease, or unspecified chronic kidney disease: Secondary | ICD-10-CM | POA: Diagnosis not present

## 2023-05-09 DIAGNOSIS — I4819 Other persistent atrial fibrillation: Secondary | ICD-10-CM | POA: Diagnosis not present

## 2023-05-09 DIAGNOSIS — I252 Old myocardial infarction: Secondary | ICD-10-CM | POA: Diagnosis not present

## 2023-05-09 DIAGNOSIS — I509 Heart failure, unspecified: Secondary | ICD-10-CM | POA: Diagnosis not present

## 2023-05-09 DIAGNOSIS — N959 Unspecified menopausal and perimenopausal disorder: Secondary | ICD-10-CM | POA: Diagnosis not present

## 2023-05-09 DIAGNOSIS — I2510 Atherosclerotic heart disease of native coronary artery without angina pectoris: Secondary | ICD-10-CM | POA: Diagnosis not present

## 2023-05-09 DIAGNOSIS — E034 Atrophy of thyroid (acquired): Secondary | ICD-10-CM | POA: Diagnosis not present

## 2023-05-09 DIAGNOSIS — E1122 Type 2 diabetes mellitus with diabetic chronic kidney disease: Secondary | ICD-10-CM | POA: Diagnosis not present

## 2023-06-03 DIAGNOSIS — Z1231 Encounter for screening mammogram for malignant neoplasm of breast: Secondary | ICD-10-CM | POA: Diagnosis not present

## 2023-06-09 ENCOUNTER — Ambulatory Visit: Payer: HMO | Admitting: Cardiology

## 2023-06-15 NOTE — CV Procedure (Signed)
   Electrical Cardioversion Procedure Note Melissa Boyer [id number] Oct 31, 1955  Procedure: Electrical Cardioversion Indications:  Atrial Fibrillation  Time Out: Verified patient identification, verified procedure,medications/allergies/relevent history reviewed, required imaging and test results available.  Performed  Procedure Details  The patient signed informed consent.   The patient was NPO past midnight. Has had therapeutic anticoagulation with Eliqui greater than 3 weeks. The patient denies any interruption of anticoagulation.  Anesthesia was administered by Dr. Chaney Malling.  Adequate airway was maintained throughout and vital followed per protocol.  He was cardioverted x 1 with 200J of biphasic synchronized energy.  He converted to NSR.  There were no apparent complications.  The patient tolerated the procedure well and had normal neuro status and respiratory status post procedure with vitals stable as recorded elsewhere.     IMPRESSION:  Successful cardioversion of atrial fibrillation   Follow up:  We will arrange follow up with her primary cardiology.  He will continue on current medical therapy.  The patient advised to continue anticoagulation.  This is a late dictation   Thomasene Ripple 06/15/2023, 9:38 PM

## 2023-07-21 DIAGNOSIS — Z905 Acquired absence of kidney: Secondary | ICD-10-CM | POA: Diagnosis not present

## 2023-07-21 DIAGNOSIS — Z6832 Body mass index (BMI) 32.0-32.9, adult: Secondary | ICD-10-CM | POA: Diagnosis not present

## 2023-07-21 DIAGNOSIS — E1122 Type 2 diabetes mellitus with diabetic chronic kidney disease: Secondary | ICD-10-CM | POA: Diagnosis not present

## 2023-07-21 DIAGNOSIS — E1159 Type 2 diabetes mellitus with other circulatory complications: Secondary | ICD-10-CM | POA: Diagnosis not present

## 2023-07-25 ENCOUNTER — Other Ambulatory Visit: Payer: Self-pay | Admitting: Cardiology

## 2023-08-08 DIAGNOSIS — I2510 Atherosclerotic heart disease of native coronary artery without angina pectoris: Secondary | ICD-10-CM | POA: Diagnosis not present

## 2023-08-08 DIAGNOSIS — F411 Generalized anxiety disorder: Secondary | ICD-10-CM | POA: Diagnosis not present

## 2023-08-08 DIAGNOSIS — N1832 Chronic kidney disease, stage 3b: Secondary | ICD-10-CM | POA: Diagnosis not present

## 2023-08-08 DIAGNOSIS — E034 Atrophy of thyroid (acquired): Secondary | ICD-10-CM | POA: Diagnosis not present

## 2023-08-08 DIAGNOSIS — I130 Hypertensive heart and chronic kidney disease with heart failure and stage 1 through stage 4 chronic kidney disease, or unspecified chronic kidney disease: Secondary | ICD-10-CM | POA: Diagnosis not present

## 2023-08-08 DIAGNOSIS — E782 Mixed hyperlipidemia: Secondary | ICD-10-CM | POA: Diagnosis not present

## 2023-08-08 DIAGNOSIS — E1159 Type 2 diabetes mellitus with other circulatory complications: Secondary | ICD-10-CM | POA: Diagnosis not present

## 2023-08-08 DIAGNOSIS — I509 Heart failure, unspecified: Secondary | ICD-10-CM | POA: Diagnosis not present

## 2023-08-08 DIAGNOSIS — E1122 Type 2 diabetes mellitus with diabetic chronic kidney disease: Secondary | ICD-10-CM | POA: Diagnosis not present

## 2023-08-08 DIAGNOSIS — I252 Old myocardial infarction: Secondary | ICD-10-CM | POA: Diagnosis not present

## 2023-08-08 DIAGNOSIS — I4819 Other persistent atrial fibrillation: Secondary | ICD-10-CM | POA: Diagnosis not present

## 2023-08-08 DIAGNOSIS — G4733 Obstructive sleep apnea (adult) (pediatric): Secondary | ICD-10-CM | POA: Diagnosis not present

## 2023-08-17 ENCOUNTER — Ambulatory Visit: Payer: HMO | Attending: Cardiology | Admitting: Cardiology

## 2023-08-17 ENCOUNTER — Encounter: Payer: Self-pay | Admitting: Cardiology

## 2023-08-17 VITALS — BP 110/78 | HR 65 | Ht 66.0 in | Wt 199.6 lb

## 2023-08-17 DIAGNOSIS — E782 Mixed hyperlipidemia: Secondary | ICD-10-CM

## 2023-08-17 DIAGNOSIS — I480 Paroxysmal atrial fibrillation: Secondary | ICD-10-CM

## 2023-08-17 DIAGNOSIS — I10 Essential (primary) hypertension: Secondary | ICD-10-CM

## 2023-08-17 DIAGNOSIS — G4733 Obstructive sleep apnea (adult) (pediatric): Secondary | ICD-10-CM | POA: Diagnosis not present

## 2023-08-17 DIAGNOSIS — I4821 Permanent atrial fibrillation: Secondary | ICD-10-CM | POA: Diagnosis not present

## 2023-08-17 DIAGNOSIS — E669 Obesity, unspecified: Secondary | ICD-10-CM

## 2023-08-17 NOTE — Patient Instructions (Signed)
 Medication Instructions:  Your physician recommends that you continue on your current medications as directed. Please refer to the Current Medication list given to you today.  *If you need a refill on your cardiac medications before your next appointment, please call your pharmacy*   Testing/Procedures: Your physician has recommended that you have a pulmonary function test. Pulmonary Function Tests are a group of tests that measure how well air moves in and out of your lungs.  Pulmonary Function Tests Pulmonary function tests (PFTs) are breathing tests that are used to: Measure how well your lungs work. Find out what is causing your lung problems. Find the best treatment for you. You may have PFTs: If you have a condition that affects your lungs, such as asthma or chronic obstructive pulmonary disease (COPD). To watch for changes in your lung function over time if you have a long-term (chronic) lung disease. If you are an IT trainer. PFTs check the effects of being exposed to chemicals over a long period of time. To check lung function: Before having surgery or other procedures. If you smoke. To check if prescribed medicines or treatments are helping your lungs. Tell a health care provider about: Any allergies you have. All medicines you are taking, including inhaler or nebulizer medicines, vitamins, herbs, eye drops, creams, and over-the-counter medicines. Any bleeding problems you have. Any surgeries you have had, especially recent surgery of the eye, abdomen, or chest. These can make PFTs difficult or unsafe. Any medical conditions you have, including chest pain or heart problems, tuberculosis, or respiratory infections such as pneumonia, a cold, or the flu. Any fear of being in closed spaces (claustrophobia). Some of your tests may be in a closed space. What are the risks? Your health care provider will talk with you about risks. These may include: Feeling light-headed  due to fast, deep breathing known as overbreathing or hyperventilation. An asthma attack from deep breathing. What happens before the test? Take over-the-counter and prescription medicines only as told by your health care provider. If you take inhaler or nebulizer medicines, ask your health care provider which medicines you should take on the day of your testing. Some inhaler medicines may interfere with PFTs if they are taken shortly before the tests. Follow instructions from your health care provider about what you may eat and drink. These may include: Avoiding eating large meals. Avoiding using caffeine before the testing. Not drinkingalcohol for up to 4 hours before the test. Do not use any products that contain nicotine or tobacco for up to 4 hours before your test. These products include cigarettes, chewing tobacco, and vaping devices, such as e-cigarettes. These can affect your test results. If you need help quitting, ask your health care provider. Wear comfortable clothing that will not get in the way of your breathing. Avoid exercise that takes a lot of effort (strenuous exercise) for at least 30 minutes before the test. What happens during the test?  You will be given: A soft noseclip to wear. This allows all of your breaths to go through your mouth instead of your nose. A germ-free (sterile) mouthpiece. It will be attached to a spirometer machine that measures your breathing. You will be asked to do breathing exercises. The exercises will be done by breathing in (inhaling) and breathing out (exhaling). You may have to repeat the exercises many times before the testing is complete. You will need to follow instructions exactly as told to get accurate results. Make sure to blow as hard  and as fast as you can when you are told to do so. You may be given a medicine called a bronchodilator. This makes the small air passages in your lungs larger so you can breathe easier. The tests will be  repeated after the medicine takes effect. You will be watched for any problems, such as feeling faint or dizzy, or having trouble breathing. The procedure may vary among health care providers and hospitals. What can I expect after the test? Your results will be compared with the expected lung function of someone with healthy lungs who is similar to you in several ways. These ways include age, sex, height, weight, and race or ethnicity. This is done to show how your lung function compares with normal lung function (percent predicted). The percent predicted helps your health care provider know if your lung function is normal or not. If you have had PFTs done before, your health care provider will compare your current results with past results. This shows if your lung function is better, worse, or the same as before. It is up to you to get the results of your procedure. Ask your health care provider, or the department that is doing the procedure, when your results will be ready. After you get your results, talk with your health care provider about treatment options, if necessary. This information is not intended to replace advice given to you by your health care provider. Make sure you discuss any questions you have with your health care provider. Document Revised: 11/09/2021 Document Reviewed: 11/09/2021 Elsevier Patient Education  2024 [employer] Inc.  Follow-Up: At [facility], you and your health needs are our priority.  As part of our continuing mission to provide you with exceptional heart care, our providers are all part of one team.  This team includes your primary Cardiologist (physician) and Advanced Practice Providers or APPs (Physician Assistants and Nurse Practitioners) who all work together to provide you with the care you need, when you need it.  Your next appointment:   6 month(s)  Provider:   Thomasene Ripple, DO     Other Instructions       1st Floor: - Lobby -  Registration  - Pharmacy  - Lab - Cafe  2nd Floor: - PV Lab - Diagnostic Testing (echo, CT, nuclear med)  3rd Floor: - Vacant  4th Floor: - TCTS (cardiothoracic surgery) - AFib Clinic - Structural Heart Clinic - Vascular Surgery  - Vascular Ultrasound  5th Floor: - [facility] Cardiology (general and EP) - Clinical Pharmacy for coumadin, hypertension, lipid, weight-loss medications, and med management appointments    Valet parking services will be available as well.

## 2023-08-18 NOTE — Progress Notes (Signed)
 Cardiology Office Note:    Date:  08/18/2023   ID:  Melissa Boyer, Melissa Boyer 26-Apr-1956, MRN [id number]  PCP:  Gus Height, PA  Cardiologist:  Thomasene Ripple, DO  Electrophysiologist:  Regan Lemming, MD   Referring MD: Mikki Santee Key, *   'I am doing well"  History of Present Illness:    Melissa Boyer is a 68 y.o. female with a hx of diabetes mellitus, hyperlipidemia, atrial fibrillation on Eliquis which was diagnosed in 2019 status post ablation,  sleep apnea on CPAP, hypothyroidism, former smoker, coronary artery disease nonobstructive per her recent cardiac catheterization, recent depresssed EF 30-35% which has now recovered with EF58% on cardiac MR.    Recent visit with me has discussed with the patient for getting a cardioversion.  She underwent a cardioversion April 14, 2023.  She converted to sinus rhythm since that time she has transition back into atrial fibrillation.  She tells me she has not had has fatigue and shortness of breath that she was with prior A-fib episodes.  She he has been adhering to a fasting regimen and has lost 35 pounds. She reports feeling good and has been adhering to her medication regimen.  The patient was previously on Farxiga, but it was discontinued due to hypoglycemia. She was switched to [facility], which she has been taking since February. She has not experienced any side effects from the [facility].  The patient has not been taking fenofibrate due to concerns about side effects and a desire to manage her cholesterol through weight loss.  The patient also has sleep apnea and has been struggling with CPAP therapy. She is interested in the [place] device as an alternative treatment.  The patient has a history of AFib and has undergone cardioversion and ablation in the past. However, she is not interested in further rhythm control interventions for AFib, preferring to manage her condition with medication. She is currently taking amiodarone  and a beta blocker.   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   Hypercholesteremia    Hypertension    Left renal mass 12/29/2019   Mixed hyperlipidemia 02/27/2019   Obesity (BMI 35.0-39.9 without comorbidity) 12/29/2019   Obstructive sleep apnea on CPAP 06/19/2019   Paroxysmal atrial fibrillation (HCC) 02/27/2019   Pure hypercholesterolemia 01/01/2014   Thyroid disease    hypothyroid   Unspecified hypothyroidism 01/01/2014    Past Surgical History:  Procedure Laterality Date   ATRIAL FIBRILLATION ABLATION N/A 10/07/2021   Procedure: ATRIAL FIBRILLATION ABLATION;  Surgeon: Regan Lemming, MD;  Location: [facility] INVASIVE CV LAB;  Service: Cardiovascular;  Laterality: N/A;   BUBBLE STUDY  10/16/2020   Procedure: BUBBLE STUDY;  Surgeon: Parke Poisson, MD;  Location[facility] ENDOSCOPY;  Service: Cardiovascular;;   CARDIOVERSION N/A 10/16/2020   Procedure: CARDIOVERSION;  Surgeon: Parke Poisson, MD;  Location: [facility] ENDOSCOPY;  Service: Cardiovascular;  Laterality: N/A;   CARDIOVERSION N/A 12/04/2020   Procedure: CARDIOVERSION;  Surgeon: Sheryle Donning, MD;  Location: [facility] ENDOSCOPY;  Service: Cardiovascular;  Laterality: N/A;   CARDIOVERSION N/A 01/14/2021   Procedure: CARDIOVERSION (CATH LAB);  Surgeon: Lei Pump, MD;  Location: [facility] INVASIVE CV LAB;  Service: Cardiovascular;  Laterality: N/A;   CARDIOVERSION N/A 04/14/2023   Procedure: CARDIOVERSION (CATH LAB);  Surgeon: Jaqualyn Juday, DO;  Location: [facility]  INVASIVE CV LAB;  Service: Cardiovascular;  Laterality: N/A;   COLPOSCOPY  10/98   normal, no biopsy   ENDOMETRIAL ABLATION  12/02/2002   ENDOMETRIAL BIOPSY  5/03   IR RADIOLOGIST EVAL & MGMT  11/05/2020   RIGHT/LEFT HEART CATH AND CORONARY ANGIOGRAPHY N/A 07/18/2020   Procedure: RIGHT/LEFT HEART CATH AND CORONARY ANGIOGRAPHY;  Surgeon: Lucendia Rusk, MD;  Location: [facility] INVASIVE CV LAB;  Service: Cardiovascular;  Laterality: N/A;   TEE WITHOUT CARDIOVERSION N/A 10/16/2020   Procedure: TRANSESOPHAGEAL ECHOCARDIOGRAM (TEE);  Surgeon: Euell Herrlich, MD;  Location: [facility] ENDOSCOPY;  Service: Cardiovascular;  Laterality: N/A;   THYROIDECTOMY, PARTIAL     thyroid nodule excision left side    Current Medications: Current Meds  Medication Sig   acetaminophen (TYLENOL) 650 MG CR tablet Take 1,300 mg by mouth in the morning and at bedtime.   amiodarone (PACERONE) 200 MG tablet Take 400 mg (2 tablets) twice daily for 2 weeks then take 200 mg (1 tablet) once daily (Patient taking differently: Take 200 mg by mouth 2 (two) times daily.)   apixaban (ELIQUIS) 5 MG TABS tablet Take 1 tablet (5 mg total) by mouth 2 (two) times daily.   atorvastatin (LIPITOR) 20 MG tablet Take 20 mg by mouth every evening.   carvedilol (COREG) 3.125 MG tablet TAKE 1 TABLET(3.125 MG) BY MOUTH TWICE DAILY   clonazePAM (KLONOPIN) 0.5 MG tablet Take 0.25 mg by mouth 2 (two) times daily as needed for anxiety.   furosemide (LASIX) 40 MG tablet TAKE 1 TABLET(40 MG) BY MOUTH DAILY   levothyroxine (SYNTHROID, LEVOTHROID) 125 MCG tablet Take 125 mcg by mouth daily before breakfast.   metFORMIN (GLUCOPHAGE) 500 MG tablet Take 1,000 mg by mouth 2 (two) times daily with a meal.   MOUNJARO 5 MG/0.5ML Pen Inject 5 mg into the skin once a week.   nitroGLYCERIN (NITROSTAT) 0.4 MG SL tablet Place 0.4 mg under the tongue every 5 (five) minutes x 3 doses as needed for chest pain.   sacubitril-valsartan (ENTRESTO) 49-51 MG TAKE 1 TABLET BY  MOUTH 2 TIMES A DAY   spironolactone (ALDACTONE) 25 MG tablet TAKE 1 TABLET(25 MG) BY MOUTH DAILY     Allergies:   Tape   Social History   Socioeconomic History   Marital status: Divorced    Spouse name: Not on file   Number of children: 1   Years of education: Not on file   Highest education level: Not on file  Occupational History   Not on file  Tobacco Use   Smoking status: Former    Current packs/day: 0.00    Average packs/day: 1 pack/day for 30.0 years (30.0 ttl pk-yrs)    Types: Cigarettes    Start date: 07/11/1973    Quit date: 07/12/2003    Years since quitting: 20.1   Smokeless tobacco: Never  Vaping Use   Vaping status: Never Used  Substance and Sexual Activity   Alcohol use: Never   Drug use: No   Sexual activity: Yes    Partners: Male    Birth control/protection: Post-menopausal  Other Topics Concern   Not on  file  Social History Narrative   Not on file   Social Drivers of Health   Financial Resource Strain: Not on file  Food Insecurity: Not on file  Transportation Needs: Not on file  Physical Activity: Not on file  Stress: Not on file  Social Connections: Not on file     Family History: The patient's family history includes Cancer in her maternal aunt, mother, and sister; Diabetes in her maternal grandfather; Emphysema in her father; Heart attack in her maternal aunt and maternal grandmother; Hypertension in her father.  ROS:   Review of Systems  Constitution: Negative for decreased appetite, fever and weight gain.  HENT: Negative for congestion, ear discharge, hoarse voice and sore throat.   Eyes: Negative for discharge, redness, vision loss in right eye and visual halos.  Cardiovascular: Negative for chest pain, dyspnea on exertion, leg swelling, orthopnea and palpitations.  Respiratory: Negative for cough, hemoptysis, shortness of breath and snoring.   Endocrine: Negative for heat intolerance and polyphagia.  Hematologic/Lymphatic: Negative for  bleeding problem. Does not bruise/bleed easily.  Skin: Negative for flushing, nail changes, rash and suspicious lesions.  Musculoskeletal: Negative for arthritis, joint pain, muscle cramps, myalgias, neck pain and stiffness.  Gastrointestinal: Negative for abdominal pain, bowel incontinence, diarrhea and excessive appetite.  Genitourinary: Negative for decreased libido, genital sores and incomplete emptying.  Neurological: Negative for brief paralysis, focal weakness, headaches and loss of balance.  Psychiatric/Behavioral: Negative for altered mental status, depression and suicidal ideas.  Allergic/Immunologic: Negative for HIV exposure and persistent infections.    EKGs/Labs/Other Studies Reviewed:    The following studies were reviewed today:   EKG: EKG showed evidence of 4:1 atrial flutter  Zio monitor 03/15/2022 Patch Wear Time:  16 days and 2 hours (2023-10-09T06:16:03-399 to 2023-10-29T17:24:17-0400) Monitor 1 Atrial Flutter occurred continuously (100% burden), ranging from 48-88 bpm (avg of 69 bpm). Atrial Flutter may be possible Atrial Tachycardia with variable block. Isolated VEs were rare (<1.0%), and no VE Couplets or VE Triplets were present. Monitor 2 Atrial Flutter occurred continuously (100% burden), ranging from 42-128 bpm (avg of 68 bpm). Isolated VEs were rare (<1.0%), and no VE Couplets or VE Triplets were present.  Conclusion: There is evidence of continuous atrial flutter.  Recent Labs: 01/21/2023: ALT 21 04/07/2023: BUN 27; Creatinine, Ser 1.30; Hemoglobin 14.4; Platelets 258; Potassium 4.8; Sodium 140  Recent Lipid Panel No results found for: "CHOL", "TRIG", "HDL", "CHOLHDL", "VLDL", "LDLCALC", "LDLDIRECT"  Physical Exam:    VS:  BP 110/78 (BP Location: Right Arm, Patient Position: Sitting, Cuff Size: Normal)   Pulse 65   Ht 5\' 6"  (1.676 m)   Wt 199 lb 9.6 oz (90.5 kg)   LMP 12/02/2002 (Approximate)   SpO2 96%   BMI 32.22 kg/m     Wt Readings from Last  3 Encounters:  08/17/23 199 lb 9.6 oz (90.5 kg)  04/14/23 222 lb (100.7 kg)  03/15/23 234 lb (106.1 kg)     GEN: Well nourished, well developed in no acute distress HEENT: Normal NECK: No JVD; No carotid bruits LYMPHATICS: No lymphadenopathy CARDIAC: S1S2 noted,RRR, no murmurs, rubs, gallops RESPIRATORY:  Clear to auscultation without rales, wheezing or rhonchi  ABDOMEN: Soft, non-tender, non-distended, +bowel sounds, no guarding. EXTREMITIES: No edema, No cyanosis, no clubbing MUSCULOSKELETAL:  No deformity  SKIN: Warm and dry NEUROLOGIC:  Alert and oriented x 3, non-focal PSYCHIATRIC:  Normal affect, good insight  ASSESSMENT:    1. Permanent atrial fibrillation (HCC)   2. Obstructive sleep apnea  on CPAP   3. OSA (obstructive sleep apnea)   4. Primary hypertension   5. Mixed hyperlipidemia   6. Obesity (BMI 30-39.9)    PLAN:    Shared decision now permanent atrial fibrillation- she does not want to have any further rhythm control interventions.  She continues to go into atrial fibrillation despite cardioversions and ablation. Prefers rate control over rhythm control. Reluctant to undergo further ablation or cardioversions. Discussed stroke prevention as she had questions about the Eliquis therefore I discussed [place] device as alternative to long-term anticoagulation.  But for right now she prefers not to proceed with this. - She is in atrial fibrillation today in the office - Discuss potential discontinuation of amiodarone and management with beta blocker. - Monitor thyroid function annually and pulmonary function biennially due to amiodarone.   Hypertriglyceridemia Triglycerides at 163 mg/dL, above target. Atorvastatin insufficient for triglyceride control. Fenofibrate considered but not started due to side effects. Weight loss and dietary management attempted. - Discuss benefits and risks of fenofibrate for triglyceride reduction. - Encourage reduced carbohydrate  intake.  Hyperlipidemia - continue with current statin medication.  Obstructive Sleep Apnea Difficulty tolerating CPAP. Interested in [place] device. Discussed MRI compatibility concerns, no MRI needed currently. - Refer to pulmonary specialist for [facility] device evaluation.  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  Goals of Care Prefers to avoid further invasive procedures for atrial fibrillation. Comfortable with rate control if asymptomatic. - Document preference to avoid further ablation and focus on rate control. - Discuss future management including potential amiodarone discontinuation and beta blocker use.  Follow-up in 6 months or sooner if needed.  The patient is in agreement with the above plan. The patient left the office in stable condition.  The patient will follow up in   Medication Adjustments/Labs and Tests Ordered: Current medicines are reviewed at length with the patient today.  Concerns regarding medicines are outlined above.  Orders Placed This Encounter  Procedures   Ambulatory referral to Pulmonology   EKG 12-Lead   Pulmonary function test   No orders of the defined types were placed in this encounter.   Patient Instructions  Medication Instructions:  Your physician recommends that you continue on your current medications as directed. Please refer to the Current Medication list given to you today.  *If you need a refill on your cardiac medications before your next appointment, please call your pharmacy*  Testing/Procedures: Your physician has recommended that you have a pulmonary function test. Pulmonary Function Tests are a group of tests that measure how well air moves in and out of your lungs.  Pulmonary Function Tests Pulmonary function tests (PFTs) are breathing tests that are used to: Measure how well your lungs work. Find out what is causing your lung problems. Find the best treatment for you. You may have PFTs: If you have a  condition that affects your lungs, such as asthma or chronic obstructive pulmonary disease (COPD). To watch for changes in your lung function over time if you have a long-term (chronic) lung disease. If you are an IT trainer. PFTs check the effects of being exposed to chemicals over a long period of time. To check lung function: Before having surgery or other procedures. If you smoke. To check if prescribed medicines or treatments are helping your lungs. Tell a health care provider about: Any allergies you have. All medicines you are taking, including inhaler or nebulizer medicines, vitamins, herbs, eye drops, creams, and over-the-counter medicines. Any bleeding problems you  have. Any surgeries you have had, especially recent surgery of the eye, abdomen, or chest. These can make PFTs difficult or unsafe. Any medical conditions you have, including chest pain or heart problems, tuberculosis, or respiratory infections such as pneumonia, a cold, or the flu. Any fear of being in closed spaces (claustrophobia). Some of your tests may be in a closed space. What are the risks? Your health care provider will talk with you about risks. These may include: Feeling light-headed due to fast, deep breathing known as overbreathing or hyperventilation. An asthma attack from deep breathing. What happens before the test? Take over-the-counter and prescription medicines only as told by your health care provider. If you take inhaler or nebulizer medicines, ask your health care provider which medicines you should take on the day of your testing. Some inhaler medicines may interfere with PFTs if they are taken shortly before the tests. Follow instructions from your health care provider about what you may eat and drink. These may include: Avoiding eating large meals. Avoiding using caffeine before the testing. Not drinkingalcohol for up to 4 hours before the test. Do not use any products that contain  nicotine or tobacco for up to 4 hours before your test. These products include cigarettes, chewing tobacco, and vaping devices, such as e-cigarettes. These can affect your test results. If you need help quitting, ask your health care provider. Wear comfortable clothing that will not get in the way of your breathing. Avoid exercise that takes a lot of effort (strenuous exercise) for at least 30 minutes before the test. What happens during the test?  You will be given: A soft noseclip to wear. This allows all of your breaths to go through your mouth instead of your nose. A germ-free (sterile) mouthpiece. It will be attached to a spirometer machine that measures your breathing. You will be asked to do breathing exercises. The exercises will be done by breathing in (inhaling) and breathing out (exhaling). You may have to repeat the exercises many times before the testing is complete. You will need to follow instructions exactly as told to get accurate results. Make sure to blow as hard and as fast as you can when you are told to do so. You may be given a medicine called a bronchodilator. This makes the small air passages in your lungs larger so you can breathe easier. The tests will be repeated after the medicine takes effect. You will be watched for any problems, such as feeling faint or dizzy, or having trouble breathing. The procedure may vary among health care providers and hospitals. What can I expect after the test? Your results will be compared with the expected lung function of someone with healthy lungs who is similar to you in several ways. These ways include age, sex, height, weight, and race or ethnicity. This is done to show how your lung function compares with normal lung function (percent predicted). The percent predicted helps your health care provider know if your lung function is normal or not. If you have had PFTs done before, your health care provider will compare your current results  with past results. This shows if your lung function is better, worse, or the same as before. It is up to you to get the results of your procedure. Ask your health care provider, or the department that is doing the procedure, when your results will be ready. After you get your results, talk with your health care provider about treatment options, if necessary. This information  is not intended to replace advice given to you by your health care provider. Make sure you discuss any questions you have with your health care provider. Document Revised: 11/09/2021 Document Reviewed: 11/09/2021 Elsevier Patient Education  2024 Elsevier Inc.  Follow-Up: At [facility], you and your health needs are our priority.  As part of our continuing mission to provide you with exceptional heart care, our providers are all part of one team.  This team includes your primary Cardiologist (physician) and Advanced Practice Providers or APPs (Physician Assistants and Nurse Practitioners) who all work together to provide you with the care you need, when you need it.  Your next appointment:   6 month(s)  Provider:   Thomasene Ripple, DO      Other Instructions;   1st Floor: - Lobby - Registration  - Pharmacy  - Lab - Cafe  2nd Floor: - PV Lab - Diagnostic Testing (echo, CT, nuclear med)  3rd Floor: - Vacant  4th Floor: - TCTS (cardiothoracic surgery) - AFib Clinic - Structural Heart Clinic - Vascular Surgery  - Vascular Ultrasound  5th Floor: - HeartCare Cardiology (general and EP) - Clinical Pharmacy for coumadin, hypertension, lipid, weight-loss medications, and med management appointments    Valet parking services will be available as well.      Adopting a Healthy Lifestyle.  Know what a healthy weight is for you (roughly BMI <25) and aim to maintain this   Aim for 7+ servings of fruits and vegetables daily   65-80+ fluid ounces of water or unsweet tea for healthy kidneys   Limit  to max 1 drink of alcohol per day; avoid smoking/tobacco   Limit animal fats in diet for cholesterol and heart health - choose grass fed whenever available   Avoid highly processed foods, and foods high in saturated/trans fats   Aim for low stress - take time to unwind and care for your mental health   Aim for 150 min of moderate intensity exercise weekly for heart health, and weights twice weekly for bone health   Aim for 7-9 hours of sleep daily   When it comes to diets, agreement about the perfect plan isnt easy to find, even among the experts. Experts at the [facility] of [employer] developed an idea known as the Healthy Eating Plate. Just imagine a plate divided into logical, healthy portions.   The emphasis is on diet quality:   Load up on vegetables and fruits - one-half of your plate: Aim for color and variety, and remember that potatoes dont count.   Go for whole grains - one-quarter of your plate: Whole wheat, barley, wheat berries, quinoa, oats, brown rice, and foods made with them. If you want pasta, go with whole wheat pasta.   Protein power - one-quarter of your plate: Fish, chicken, beans, and nuts are all healthy, versatile protein sources. Limit red meat.   The diet, however, does go beyond the plate, offering a few other suggestions.   Use healthy plant oils, such as olive, canola, soy, corn, sunflower and peanut. Check the labels, and avoid partially hydrogenated oil, which have unhealthy trans fats.   If youre thirsty, drink water. Coffee and tea are good in moderation, but skip sugary drinks and limit milk and dairy products to one or two daily servings.   The type of carbohydrate in the diet is more important than the amount. Some sources of carbohydrates, such as vegetables, fruits, whole grains, and beans-are healthier than others.  Finally, stay active  Signed, Jahid Weida, DO  08/18/2023 1:11 PM    [facility]  HeartCare   Medication Adjustments/Labs and Tests Ordered: Current medicines are reviewed at length with the patient today.  Concerns regarding medicines are outlined above.   Tests Ordered: Orders Placed This Encounter  Procedures   Ambulatory referral to Pulmonology   EKG 12-Lead   Pulmonary function test    Medication Changes: No orders of the defined types were placed in this encounter.   Follow Up:  In Person in 6 week(s)  Signed, Smriti Barkow, DO  08/18/2023 1:11 PM    [facility] Heart[facility]

## 2023-08-24 ENCOUNTER — Other Ambulatory Visit: Payer: Self-pay | Admitting: Cardiology

## 2023-08-24 ENCOUNTER — Encounter (HOSPITAL_BASED_OUTPATIENT_CLINIC_OR_DEPARTMENT_OTHER): Payer: Self-pay

## 2023-08-24 ENCOUNTER — Ambulatory Visit (HOSPITAL_BASED_OUTPATIENT_CLINIC_OR_DEPARTMENT_OTHER)
Admission: RE | Admit: 2023-08-24 | Discharge: 2023-08-24 | Disposition: A | Discharge status: Home / Self Care | Source: Ambulatory Visit | Attending: Family Medicine | Admitting: Family Medicine

## 2023-08-24 VITALS — BP 107/73 | HR 70 | Temp 97.5°F | Resp 18

## 2023-08-24 DIAGNOSIS — C642 Malignant neoplasm of left kidney, except renal pelvis: Secondary | ICD-10-CM | POA: Diagnosis not present

## 2023-08-24 DIAGNOSIS — M545 Low back pain, unspecified: Secondary | ICD-10-CM

## 2023-08-24 DIAGNOSIS — M79605 Pain in left leg: Secondary | ICD-10-CM | POA: Diagnosis not present

## 2023-08-24 LAB — POCT URINALYSIS DIP (MANUAL ENTRY)
Bilirubin, UA: NEGATIVE
Glucose, UA: NEGATIVE mg/dL
Ketones, POC UA: NEGATIVE mg/dL
Leukocytes, UA: NEGATIVE
Nitrite, UA: NEGATIVE
Protein Ur, POC: NEGATIVE mg/dL
Spec Grav, UA: <=1.005 — AB
Urobilinogen, UA: 0.2 U/dL
pH, UA: 5.5

## 2023-08-24 NOTE — ED Triage Notes (Signed)
 Pt c/o left flank pain started on Sunday, pt has hx of kidney cancer in left kidney.

## 2023-08-24 NOTE — Discharge Instructions (Addendum)
 Urinalysis is essentially normal with a trace of RBCs on dip UA.  She also has some alteration in kidney function noted on previous labs with another provider.  I was not able to find any recent labs in our system.  Encouraged to contact Dr. Franklin Ito office tomorrow and explain her symptoms.  She is concerned that this relates to her left kidney where she has had a previous left kidney cancer and a partial left nephrectomy.  If it does relate to the kidney she will need a CT.  Her pain is actually more lower back and over the left hip at the vastus lateralis.  For now she decided not to have any plain x-rays done.  She does have diabetes so I am reluctant to give her steroids.  She may have some alteration in kidney function so I am reluctant to add NSAIDs.  After discussion about options including going to an emergency room where she could get CT scanning done tonight, the patient decided to contact Dr. Franklin Ito office in the morning and see what he recommends.

## 2023-08-24 NOTE — ED Provider Notes (Signed)
 Melissa Boyer [facility]    CSN: [id number] Arrival date & time: 08/24/23  1643      History   Chief Complaint Chief Complaint  Patient presents with   Flank Pain    HPI Melissa Boyer is a 68 y.o. female.   Patient is reporting pain that started on Sunday, 08/21/2023.  The pain is in her left flank and also in her left lower back and left hip in the area of the vastus lateralis muscle.  She does have a history of chronic back pain and sciatica but this feels different.  She felt like this might be related to her kidney.  She has had a partial nephrectomy on the left due to kidney cancer.  She follows with Dr. Sim Dryer at [facility] in [place].   Flank Pain Pertinent negatives include no chest pain, no abdominal pain and no shortness of breath.    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   Hypercholesteremia    Hypertension    Left renal mass 12/29/2019   Mixed hyperlipidemia 02/27/2019   Obesity (BMI 35.0-39.9 without comorbidity) 12/29/2019   Obstructive sleep apnea on CPAP 06/19/2019   Paroxysmal atrial fibrillation (HCC) 02/27/2019   Pure hypercholesterolemia 01/01/2014   Thyroid  disease    hypothyroid   Unspecified hypothyroidism 01/01/2014    Patient Active Problem List   Diagnosis Date Noted   Medication management 02/05/2022   PAF (paroxysmal atrial fibrillation) (HCC) 02/05/2022   Depressed left ventricular ejection fraction 02/05/2022   Umbilical hernia without  obstruction and without gangrene 12/18/2020   Persistent atrial fibrillation (HCC) 12/01/2020   Secondary hypercoagulable state (HCC) 12/01/2020   Other hemochromatosis 10/26/2020   Mild CAD 10/02/2020   Thyroid  disease    Hypertension    Hypercholesteremia    Fibroids    DM2 (diabetes mellitus, type 2) (HCC)    DES exposure in utero    DOE (dyspnea on exertion)    Change in bowel habit 07/10/2020   Colon cancer screening 07/10/2020   Family history of colonic polyps 07/10/2020   History of colonic polyps 07/10/2020   Left renal mass 12/29/2019   Obesity (BMI 35.0-39.9 without comorbidity) 12/29/2019   Current use of long term anticoagulation 06/19/2019   Obstructive sleep apnea on CPAP 06/19/2019   Atrial fibrillation (HCC)    History of diabetes mellitus    Paroxysmal atrial fibrillation (HCC) 02/27/2019   Mixed hyperlipidemia 02/27/2019   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   Family history of breast cancer in first degree relative 01/01/2014   Essential hypertension 01/01/2014   Pure hypercholesterolemia 01/01/2014   Hypothyroidism 01/01/2014   Hepatitis C 03/1998    Past Surgical History:  Procedure Laterality Date   ATRIAL FIBRILLATION ABLATION N/A 10/07/2021   Procedure: ATRIAL FIBRILLATION ABLATION;  Surgeon: Lei Pump, MD;  Location: [facility] INVASIVE CV LAB;  Service: Cardiovascular;  Laterality: N/A;   BUBBLE STUDY  10/16/2020   Procedure: BUBBLE STUDY;  Surgeon: Euell Herrlich, MD;  Location: [facility] ENDOSCOPY;  Service: Cardiovascular;;   CARDIOVERSION N/A 10/16/2020   Procedure: CARDIOVERSION;  Surgeon: Euell Herrlich, MD;  Location: [facility] ENDOSCOPY;  Service: Cardiovascular;  Laterality: N/A;   CARDIOVERSION N/A 12/04/2020   Procedure: CARDIOVERSION;  Surgeon: Sheryle Donning, MD;  Location: [facility] ENDOSCOPY;  Service: Cardiovascular;  Laterality: N/A;   CARDIOVERSION N/A 01/14/2021   Procedure: CARDIOVERSION (CATH LAB);   Surgeon: Lei Pump, MD;  Location: [facility] INVASIVE CV LAB;  Service: Cardiovascular;  Laterality: N/A;   CARDIOVERSION N/A 04/14/2023   Procedure: CARDIOVERSION (CATH LAB);  Surgeon: Tobb, Kardie, DO;  Location: [facility] INVASIVE CV LAB;  Service: Cardiovascular;  Laterality: N/A;   COLPOSCOPY  10/98   normal, no biopsy   ENDOMETRIAL ABLATION  12/02/2002   ENDOMETRIAL BIOPSY  5/03   IR RADIOLOGIST EVAL & MGMT  11/05/2020   RIGHT/LEFT HEART CATH AND CORONARY ANGIOGRAPHY N/A 07/18/2020   Procedure: RIGHT/LEFT HEART CATH AND CORONARY ANGIOGRAPHY;  Surgeon: Lucendia Rusk, MD;  Location: [facility] INVASIVE CV LAB;  Service: Cardiovascular;  Laterality: N/A;   TEE WITHOUT CARDIOVERSION N/A 10/16/2020   Procedure: TRANSESOPHAGEAL ECHOCARDIOGRAM (TEE);  Surgeon: Euell Herrlich, MD;  Location: [facility] ENDOSCOPY;  Service: Cardiovascular;  Laterality: N/A;   THYROIDECTOMY, PARTIAL     thyroid  nodule excision left side    OB History     Gravida  1   Para  1   Term  1   Preterm  0   AB  0   Living  1      SAB  0   IAB  0   Ectopic  0   Multiple  0   Live Births  1            Home Medications    Prior to Admission medications   Medication Sig Start Date End Date Taking? Authorizing Provider  amiodarone  (PACERONE ) 200 MG tablet Take 400 mg (2 tablets) twice daily for 2 weeks then take 200 mg (1 tablet) once daily Patient taking differently: Take 200 mg by mouth 2 (two) times daily. 03/15/23  Yes Tobb, Kardie, DO  apixaban  (ELIQUIS ) 5 MG TABS tablet Take 1 tablet (5 mg total) by mouth 2 (two) times daily. 04/25/23  Yes Tobb, Kardie, DO  atorvastatin  (LIPITOR) 20 MG tablet Take 20 mg by mouth every evening.   Yes [provider]  carvedilol  (COREG ) 3.125 MG tablet TAKE 1 TABLET(3.125 MG) BY MOUTH TWICE DAILY 04/21/23  Yes Tobb, Kardie, DO  furosemide  (LASIX ) 40 MG tablet TAKE 1 TABLET(40 MG) BY MOUTH DAILY 04/12/23  Yes Tobb, Kardie, DO  levothyroxine  (SYNTHROID ,  LEVOTHROID) 125 MCG tablet Take 125 mcg by mouth daily before breakfast. 12/11/13  Yes [provider]  MOUNJARO 5 MG/0.5ML Pen Inject 5 mg into the skin once a week. 08/16/23  Yes [provider]  sacubitril -valsartan  (ENTRESTO ) 49-51 MG TAKE 1 TABLET BY MOUTH 2 TIMES A DAY 07/27/23  Yes Tobb, Kardie, DO  spironolactone  (ALDACTONE ) 25 MG tablet TAKE 1 TABLET(25 MG) BY MOUTH DAILY 01/10/23  Yes Tobb, Kardie, DO  acetaminophen  (TYLENOL ) 650 MG CR tablet Take 1,300 mg by mouth in the morning and at bedtime.    [provider]  clonazePAM (KLONOPIN) 0.5 MG tablet Take 0.25 mg by mouth 2 (two) times daily as needed for anxiety. 02/17/22   [provider]  FARXIGA 10 MG TABS tablet Take 10 mg by mouth daily.  03/30/23   [provider]  fenofibrate  (TRICOR ) 145 MG tablet Take 1 tablet (145 mg total) by mouth daily. Patient not taking: Reported on 08/17/2023 03/15/23   Tobb, Kardie, DO  metFORMIN  (GLUCOPHAGE ) 500 MG tablet Take 1,000 mg by mouth 2 (two) times daily with a meal.    [provider]  nitroGLYCERIN  (NITROSTAT ) 0.4 MG SL tablet Place 0.4 mg under the tongue every 5 (five) minutes x 3 doses as needed for chest pain.    [provider]    Family History Family History  Problem Relation Age of Onset   Cancer Mother        lung,    Hypertension Father    Emphysema Father    Cancer Sister        tongue   Cancer Maternal Aunt        colon, cervical, throat   Heart attack Maternal Aunt    Diabetes Maternal Grandfather    Heart attack Maternal Grandmother     Social History Social History   Tobacco Use   Smoking status: Former    Current packs/day: 0.00    Average packs/day: 1 pack/day for 30.0 years (30.0 ttl pk-yrs)    Types: Cigarettes    Start date: 07/11/1973    Quit date: 07/12/2003    Years since quitting: 20.1   Smokeless tobacco: Never  Vaping Use   Vaping status: Never Used  Substance Use Topics   Alcohol use:  Never   Drug use: No     Allergies   Tape   Review of Systems Review of Systems  Constitutional:  Negative for chills and fever.  HENT:  Negative for ear pain and sore throat.   Eyes:  Negative for pain and visual disturbance.  Respiratory:  Negative for cough and shortness of breath.   Cardiovascular:  Negative for chest pain and palpitations.  Gastrointestinal:  Negative for abdominal pain, constipation, diarrhea, nausea and vomiting.  Genitourinary:  Positive for flank pain. Negative for dysuria and hematuria.  Musculoskeletal:  Positive for back pain. Negative for arthralgias.  Skin:  Negative for color change and rash.  Neurological:  Negative for seizures and syncope.  All other systems reviewed and are negative.    Physical Exam Triage Vital Signs ED Triage Vitals  Encounter Vitals Group     BP 08/24/23 1701 107/73     Systolic BP Percentile --      Diastolic BP Percentile --      Pulse Rate 08/24/23 1701 70     Resp 08/24/23 1701 18     Temp 08/24/23 1701 (!) 97.5 F (36.4 C)     Temp Source 08/24/23 1701 Oral     SpO2 08/24/23 1701 95 %     Weight --      Height --      Head Circumference --      Peak Flow --      Pain Score 08/24/23 1700 6     Pain Loc --      Pain Education --      Exclude from Growth Chart --    No data found.  Updated Vital Signs BP 107/73 (BP Location: Left Arm)   Pulse 70   Temp (!) 97.5 F (36.4 C) (Oral)   Resp 18   LMP 12/02/2002 (Approximate)   SpO2 95%   Visual Acuity Right Eye Distance:   Left Eye Distance:   Bilateral Distance:    Right Eye Near:  Left Eye Near:    Bilateral Near:     Physical Exam Vitals and nursing note reviewed.  Constitutional:      General: She is not in acute distress.    Appearance: She is well-developed. She is not ill-appearing or toxic-appearing.  HENT:     Head: Normocephalic and atraumatic.     Right Ear: External ear normal.     Left Ear: External ear normal.     Nose:  Nose normal.     Mouth/Throat:     Lips: Pink.     Mouth: Mucous membranes are moist.  Eyes:     Conjunctiva/sclera: Conjunctivae normal.     Pupils: Pupils are equal, round, and reactive to light.  Cardiovascular:     Rate and Rhythm: Normal rate and regular rhythm.     Heart sounds: S1 normal and S2 normal. No murmur heard. Pulmonary:     Effort: Pulmonary effort is normal. No respiratory distress.     Breath sounds: Normal breath sounds. No decreased breath sounds, wheezing, rhonchi or rales.  Abdominal:     General: Bowel sounds are normal.     Palpations: Abdomen is soft.     Tenderness: There is no abdominal tenderness. There is no right CVA tenderness or left CVA tenderness.  Musculoskeletal:        General: No swelling.     Cervical back: Normal.     Thoracic back: Normal.     Lumbar back: Swelling (Left lower back to left hip), spasms and tenderness (Left lower back and the pain actually radiates to the left hip and from the left hip towards the back.) present. No edema.  Skin:    General: Skin is warm and dry.     Capillary Refill: Capillary refill takes less than 2 seconds.     Findings: No rash.  Neurological:     Mental Status: She is alert and oriented to person, place, and time.  Psychiatric:        Mood and Affect: Mood normal.      UC Treatments / Results  Labs (all labs ordered are listed, but only abnormal results are displayed) Labs Reviewed  POCT URINALYSIS DIP (MANUAL ENTRY) - Abnormal; Notable for the following components:      Result Value   Spec Grav, UA <=1.005 (*)    Blood, UA trace-intact (*)    All other components within normal limits    EKG   Radiology 01/21/23: CT Abdomen and Pelvis with IV Contrast:  IMPRESSION: 1. No acute abdominopelvic findings. 2. 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 No suspicious left renal mass. 3. Rounded 1.1 cm heterogeneously hyperattenuating lesion within  the interpolar region of the right kidney. This may represent a hemorrhagic/proteinaceous cyst, however a solid renal neoplasm is not excluded. Further evaluation with nonemergent renal protocol MRI is recommended. 4. Hepatomegaly and hepatic steatosis. 5. Colonic diverticulosis without evidence of acute diverticulitis. 6. Fibroid uterus. 7. Aortic atherosclerosis (ICD10-I70.0). Electronically Signed, By: Leverne Reading D.O., On: 01/21/2023 17:55   Procedures Procedures (including critical care time)  Medications Ordered in UC Medications - No data to display  Initial Impression / Assessment and Plan / UC Course  I have reviewed the triage vital signs and the nursing notes.  Pertinent labs & imaging results that were available during my care of the patient were reviewed by me and considered in my medical decision making (see chart for details).     Plan of  Care: History of left kidney cancer, partial left nephrectomy and left flank pain: Patient is managed by [facility] (Dr. Sim Dryer).  She is concerned that her pain and discomfort is secondary to something related to her left kidney cancer or previous left partial nephrectomy.  She will call her urology office in the morning and update them and find out if they want to see her or order a CT scan.  CT scan is not available tonight because she would need a CT with IV contrast. Left lower back and left hip pain: On exam her area of pain is really more the left lower back and the left hip or the vastus lateralis muscle.  She may need prednisone or NSAIDs.  She has diabetes and has been told that her kidney function is not normal but she does not know what her kidney function is.  I could not find any recent labs online regarding her kidney function.  We discussed that and for now I am not going to give her any medication for this pain.  She will speak with her urology follow-up with family practice if  needed.  Follow-up here as needed. Final Clinical Impressions(s) / UC Diagnoses   Final diagnoses:  Low back pain radiating to left lower extremity  Cancer of left kidney [facility])     Discharge Instructions      Urinalysis is essentially normal with a trace of RBCs on dip UA.  She also has some alteration in kidney function noted on previous labs with another provider.  I was not able to find any recent labs in our system.  Encouraged to contact Dr. Franklin Ito office tomorrow and explain her symptoms.  She is concerned that this relates to her left kidney where she has had a previous left kidney cancer and a partial left nephrectomy.  If it does relate to the kidney she will need a CT.  Her pain is actually more lower back and over the left hip at the vastus lateralis.  For now she decided not to have any plain x-rays done.  She does have diabetes so I am reluctant to give her steroids.  She may have some alteration in kidney function so I am reluctant to add NSAIDs.  After discussion about options including going to an emergency room where she could get CT scanning done tonight, the patient decided to contact Dr. Franklin Ito office in the morning and see what he recommends.     ED Prescriptions   None    PDMP not reviewed this encounter.   Guss Legacy, FNP 08/24/23 [phone]

## 2023-08-26 ENCOUNTER — Encounter: Payer: Self-pay | Admitting: Cardiology

## 2023-08-26 NOTE — Telephone Encounter (Signed)
 RN called spoke to  [email or web address]to schedule patient 's PFT.   She states she will call patient and schedule and give direction .

## 2023-08-26 NOTE — Telephone Encounter (Signed)
 Sheryle Donning return call. She states she has scheduled  an appointment on 09/15/23  for the patient .  Sheryle Donning spoke to patient , per Sheryle Donning, patient verbalized understanding.

## 2023-08-29 DIAGNOSIS — S80911A Unspecified superficial injury of right knee, initial encounter: Secondary | ICD-10-CM | POA: Diagnosis not present

## 2023-08-29 DIAGNOSIS — M25561 Pain in right knee: Secondary | ICD-10-CM | POA: Diagnosis not present

## 2023-09-15 ENCOUNTER — Ambulatory Visit (HOSPITAL_COMMUNITY)
Admission: RE | Admit: 2023-09-15 | Discharge: 2023-09-15 | Disposition: A | Discharge status: Home / Self Care | Source: Ambulatory Visit | Attending: Cardiology | Admitting: Cardiology

## 2023-09-15 DIAGNOSIS — G4733 Obstructive sleep apnea (adult) (pediatric): Secondary | ICD-10-CM | POA: Diagnosis not present

## 2023-09-15 LAB — PULMONARY FUNCTION TEST
DL/VA % pred: 68 %
DL/VA: 2.82 ml/min/mmHg/L
DLCO unc % pred: 79 %
DLCO unc: 16.37 ml/min/mmHg
FEF 25-75 Pre: 1.27 L/s
FEF2575-%Pred-Pre: 60 %
FEV1-%Pred-Pre: 85 %
FEV1-Pre: 2.11 L
FEV1FVC-%Pred-Pre: 85 %
FEV6-%Pred-Pre: 101 %
FEV6-Pre: 3.17 L
FEV6FVC-%Pred-Pre: 101 %
FVC-%Pred-Pre: 99 %
FVC-Pre: 3.25 L
Pre FEV1/FVC ratio: 65 %
Pre FEV6/FVC Ratio: 98 %
RV % pred: 153 %
RV: 3.4 L
TLC % pred: 126 %
TLC: 6.69 L

## 2023-09-16 ENCOUNTER — Ambulatory Visit: Payer: Self-pay | Admitting: Cardiology

## 2023-09-16 NOTE — Telephone Encounter (Signed)
 Pt informed of providers recommendation. Pt verbalized understanding. She will call Dr Alyne Jules office and see if she can get in sooner.

## 2023-09-20 ENCOUNTER — Other Ambulatory Visit: Payer: Self-pay | Admitting: Cardiology

## 2023-10-03 DIAGNOSIS — C642 Malignant neoplasm of left kidney, except renal pelvis: Secondary | ICD-10-CM | POA: Diagnosis not present

## 2023-10-11 DIAGNOSIS — C642 Malignant neoplasm of left kidney, except renal pelvis: Secondary | ICD-10-CM | POA: Diagnosis not present

## 2023-11-14 DIAGNOSIS — I2510 Atherosclerotic heart disease of native coronary artery without angina pectoris: Secondary | ICD-10-CM | POA: Diagnosis not present

## 2023-11-14 DIAGNOSIS — I4819 Other persistent atrial fibrillation: Secondary | ICD-10-CM | POA: Diagnosis not present

## 2023-11-14 DIAGNOSIS — I509 Heart failure, unspecified: Secondary | ICD-10-CM | POA: Diagnosis not present

## 2023-11-14 DIAGNOSIS — E1159 Type 2 diabetes mellitus with other circulatory complications: Secondary | ICD-10-CM | POA: Diagnosis not present

## 2023-11-14 DIAGNOSIS — I252 Old myocardial infarction: Secondary | ICD-10-CM | POA: Diagnosis not present

## 2023-11-14 DIAGNOSIS — G4733 Obstructive sleep apnea (adult) (pediatric): Secondary | ICD-10-CM | POA: Diagnosis not present

## 2023-11-14 DIAGNOSIS — E1122 Type 2 diabetes mellitus with diabetic chronic kidney disease: Secondary | ICD-10-CM | POA: Diagnosis not present

## 2023-11-14 DIAGNOSIS — E034 Atrophy of thyroid (acquired): Secondary | ICD-10-CM | POA: Diagnosis not present

## 2023-11-14 DIAGNOSIS — F411 Generalized anxiety disorder: Secondary | ICD-10-CM | POA: Diagnosis not present

## 2023-11-14 DIAGNOSIS — E782 Mixed hyperlipidemia: Secondary | ICD-10-CM | POA: Diagnosis not present

## 2023-11-14 DIAGNOSIS — N1832 Chronic kidney disease, stage 3b: Secondary | ICD-10-CM | POA: Diagnosis not present

## 2023-11-14 DIAGNOSIS — I130 Hypertensive heart and chronic kidney disease with heart failure and stage 1 through stage 4 chronic kidney disease, or unspecified chronic kidney disease: Secondary | ICD-10-CM | POA: Diagnosis not present

## 2023-11-15 ENCOUNTER — Other Ambulatory Visit: Payer: Self-pay | Admitting: Cardiology

## 2023-11-16 ENCOUNTER — Ambulatory Visit: Admitting: Pulmonary Disease

## 2023-11-16 VITALS — BP 104/68 | HR 68 | Ht 66.5 in | Wt 171.0 lb

## 2023-11-16 DIAGNOSIS — G4733 Obstructive sleep apnea (adult) (pediatric): Secondary | ICD-10-CM

## 2023-11-16 MED ORDER — ARNUITY ELLIPTA 100 MCG/ACT IN AEPB: 1.0000 | INHALATION_SPRAY | Freq: Every day | RESPIRATORY_TRACT | 3 refills | Status: AC

## 2023-11-16 NOTE — Progress Notes (Signed)
 Melissa Boyer    [id number]    Jul 31, 1955  Primary Care Physician:Johnson, Alfonso, [place]  Referring Physician: Tobb, Kardie, DO [address] [place],  [place] [address]  Chief complaint:   Past history of sleep apnea Shortness of breath with wheezing  HPI:  Patient with shortness of breath with wheezing Concerned about asthma Did have a recent pulmonary function test that showed obstruction, hyperinflated lung fields Never smoker  Since January has lost over 50 to 60 pounds  Was last seen in the office about 3 years ago, diagnosed with mild obstructive sleep apnea Has been using CPAP but stopped using it recently  She feels well  Still snores Not significantly sleepy during the day Still wakes up a couple of times during the night She feels her sleep is restorative nowadays  She feels overall better  She does have a history of atrial fibrillation, not able to tolerate albuterol  - This makes her heart race  History of hypertension, paroxysmal atrial fibrillation, heart failure with improved ejection fraction  Recently had a cardioversion  reformed smoker, quit in 1975   Outpatient Encounter Medications as of 11/16/2023  Medication Sig   acetaminophen  (TYLENOL ) 650 MG CR tablet Take 1,300 mg by mouth in the morning and at bedtime.   amiodarone  (PACERONE ) 200 MG tablet Take 400 mg (2 tablets) twice daily for 2 weeks then take 200 mg (1 tablet) once daily (Patient taking differently: Take 200 mg by mouth 2 (two) times daily.)   apixaban  (ELIQUIS ) 5 MG TABS tablet Take 1 tablet (5 mg total) by mouth 2 (two) times daily.   atorvastatin  (LIPITOR) 20 MG tablet Take 20 mg by mouth every evening.   carvedilol  (COREG ) 3.125 MG tablet TAKE 1 TABLET(3.125 MG) BY MOUTH TWICE DAILY   clonazePAM (KLONOPIN) 0.5 MG tablet Take 0.25 mg by mouth 2 (two) times daily as needed for anxiety.   fenofibrate  (TRICOR ) 145 MG tablet Take 1 tablet (145 mg total) by mouth daily.    Fluticasone Furoate  (ARNUITY ELLIPTA ) 100 MCG/ACT AEPB Inhale 1 puff into the lungs daily.   furosemide  (LASIX ) 40 MG tablet TAKE 1 TABLET(40 MG) BY MOUTH DAILY   levothyroxine  (SYNTHROID , LEVOTHROID) 125 MCG tablet Take 125 mcg by mouth daily before breakfast.   MOUNJARO 5 MG/0.5ML Pen Inject 5 mg into the skin once a week.   sacubitril -valsartan  (ENTRESTO ) 49-51 MG TAKE 1 TABLET BY MOUTH TWICE DAILY   spironolactone  (ALDACTONE ) 25 MG tablet TAKE 1 TABLET BY MOUTH EVERY DAY   FARXIGA 10 MG TABS tablet Take 10 mg by mouth daily. (Patient not taking: Reported on 11/16/2023)   metFORMIN  (GLUCOPHAGE ) 500 MG tablet Take 1,000 mg by mouth 2 (two) times daily with a meal. (Patient not taking: Reported on 11/16/2023)   nitroGLYCERIN  (NITROSTAT ) 0.4 MG SL tablet Place 0.4 mg under the tongue every 5 (five) minutes x 3 doses as needed for chest pain. (Patient not taking: Reported on 11/16/2023)   No facility-administered encounter medications on file as of 11/16/2023.    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   Hypercholesteremia    Hypertension    Left renal mass 12/29/2019   Mixed hyperlipidemia 02/27/2019   Obesity (BMI 35.0-39.9 without comorbidity) 12/29/2019   Obstructive sleep apnea on CPAP 06/19/2019   Paroxysmal atrial fibrillation (HCC) 02/27/2019   Pure hypercholesterolemia 01/01/2014   Thyroid  disease     hypothyroid   Unspecified hypothyroidism 01/01/2014    Past Surgical History:  Procedure Laterality Date   ATRIAL FIBRILLATION ABLATION N/A 10/07/2021   Procedure: ATRIAL FIBRILLATION ABLATION;  Surgeon: Inocencio Soyla Lunger, MD;  Location: [facility] INVASIVE CV LAB;  Service: Cardiovascular;  Laterality: N/A;   BUBBLE STUDY  10/16/2020   Procedure: BUBBLE STUDY;  Surgeon: Loni Soyla LABOR, MD;  Location: [facility] ENDOSCOPY;  Service: Cardiovascular;;   CARDIOVERSION N/A 10/16/2020   Procedure: CARDIOVERSION;  Surgeon: Loni Soyla LABOR, MD;  Location: [facility] ENDOSCOPY;  Service: Cardiovascular;  Laterality: N/A;   CARDIOVERSION N/A 12/04/2020   Procedure: CARDIOVERSION;  Surgeon: Lonni Slain, MD;  Location: [facility] ENDOSCOPY;  Service: Cardiovascular;  Laterality: N/A;   CARDIOVERSION N/A 01/14/2021   Procedure: CARDIOVERSION (CATH LAB);  Surgeon: Inocencio Soyla Lunger, MD;  Location: [facility] INVASIVE CV LAB;  Service: Cardiovascular;  Laterality: N/A;   CARDIOVERSION N/A 04/14/2023   Procedure: CARDIOVERSION (CATH LAB);  Surgeon: Tobb, Kardie, DO;  Location: [facility] INVASIVE CV LAB;  Service: Cardiovascular;  Laterality: N/A;   COLPOSCOPY  10/98   normal, no biopsy   ENDOMETRIAL ABLATION  12/02/2002   ENDOMETRIAL BIOPSY  5/03   IR RADIOLOGIST EVAL & MGMT  11/05/2020   RIGHT/LEFT HEART CATH AND CORONARY ANGIOGRAPHY N/A 07/18/2020   Procedure: RIGHT/LEFT HEART CATH AND CORONARY ANGIOGRAPHY;  Surgeon: Dann Candyce RAMAN, MD;  Location: [facility] INVASIVE CV LAB;  Service: Cardiovascular;  Laterality: N/A;   TEE WITHOUT CARDIOVERSION N/A 10/16/2020   Procedure: TRANSESOPHAGEAL ECHOCARDIOGRAM (TEE);  Surgeon: Loni Soyla LABOR, MD;  Location: [facility] ENDOSCOPY;  Service: Cardiovascular;  Laterality: N/A;   THYROIDECTOMY, PARTIAL     thyroid  nodule excision left side    Family History  Problem Relation Age of Onset   Cancer Mother        lung,    Hypertension Father    Emphysema Father    Cancer Sister        tongue    Cancer Maternal Aunt        colon, cervical, throat   Heart attack Maternal Aunt    Diabetes Maternal Grandfather    Heart attack Maternal Grandmother     Social History   Socioeconomic History   Marital status: Divorced    Spouse name: Not on file   Number of children: 1   Years of education: Not on file   Highest education level: Not on file  Occupational History   Not on file  Tobacco Use   Smoking status: Former    Current packs/day: 0.00    Average packs/day: 1 pack/day for 30.0 years (30.0 ttl pk-yrs)    Types: Cigarettes    Start date: 07/11/1973    Quit date: 07/12/2003    Years since quitting: 20.3   Smokeless tobacco: Never  Vaping Use   Vaping status: Never Used  Substance and Sexual Activity   Alcohol use: Never  Drug use: No   Sexual activity: Yes    Partners: Male    Birth control/protection: Post-menopausal  Other Topics Concern   Not on file  Social History Narrative   Not on file   Social Drivers of Health   Financial Resource Strain: Not on file  Food Insecurity: Not on file  Transportation Needs: Not on file  Physical Activity: Not on file  Stress: Not on file  Social Connections: Not on file  Intimate Partner Violence: Not on file    Review of Systems  Respiratory:  Positive for apnea, shortness of breath and wheezing.   Psychiatric/Behavioral:  Positive for sleep disturbance.     Vitals:   11/16/23 1611  BP: 104/68  Pulse: 68  SpO2: 98%     Physical Exam Constitutional:      Appearance: Normal appearance.  HENT:     Head: Normocephalic.     Mouth/Throat:     Mouth: Mucous membranes are moist.  Eyes:     General: No scleral icterus. Cardiovascular:     Rate and Rhythm: Normal rate and regular rhythm.     Heart sounds: No murmur heard.    No friction rub.  Pulmonary:     Effort: No respiratory distress.     Breath sounds: No stridor. No wheezing or rhonchi.  Musculoskeletal:     Cervical back: No rigidity or tenderness.   Neurological:     Mental Status: She is alert.  Psychiatric:        Mood and Affect: Mood normal.       11/16/2023    4:00 PM 09/16/2020    4:14 PM  Results of the Epworth flowsheet  Sitting and reading 2 1  Watching TV 3 1  Sitting, inactive in a public place (e.g. a theatre or a meeting) 0 0  As a passenger in a car for an hour without a break 0 1  Lying down to rest in the afternoon when circumstances permit 0 1  Sitting and talking to someone 0 0  Sitting quietly after a lunch without alcohol 0 1  In a car, while stopped for a few minutes in traffic 0 0  Total score 5 5     Data Reviewed: Last sleep study was in 2022-in-lab sleep study - AHI of 11/h  Pulmonary function test was reviewed with the patient  Assessment:  Has history of obstructive sleep apnea - Has had a lot of weight loss recently - Has not been using CPAP - Feels sleep quality is better - Concerned about possibility of still having obstructive sleep apnea  Atrial fibrillation - Recent cardioversion  Heart failure with improving ejection fraction  Abnormal pulmonary function test showing obstruction with hyperinflation - Past history of smoking - Intolerant of albuterol   Plan/Recommendations: Schedule patient for a home sleep study  Trial with a steroid inhaler as she has a lot of shortness of breath and wheezing - Prescription for Arnuity 100 to be used daily  Encouraged graded activities as tolerated Encouraged walking  Follow-up in about 3 months  Encouraged to call with significant concerns    Jennet Epley MD [facility] 11/16/2023, 4:43 PM  CC: Tobb, Kardie, DO

## 2023-11-16 NOTE — Patient Instructions (Addendum)
 We will schedule you for sleep study  Will call in an inhaler-Arnuity to be used daily  I will see you in about 3 months  Continue to work on weight loss efforts  Call us  with significant concerns

## 2023-12-05 ENCOUNTER — Encounter

## 2023-12-05 DIAGNOSIS — G4730 Sleep apnea, unspecified: Secondary | ICD-10-CM | POA: Diagnosis not present

## 2023-12-05 DIAGNOSIS — G4733 Obstructive sleep apnea (adult) (pediatric): Secondary | ICD-10-CM

## 2023-12-07 ENCOUNTER — Telehealth: Payer: Self-pay | Admitting: Cardiology

## 2023-12-07 ENCOUNTER — Other Ambulatory Visit: Payer: Self-pay | Admitting: Cardiology

## 2023-12-07 MED ORDER — AMIODARONE HCL 200 MG PO TABS: 200.0000 mg | ORAL_TABLET | Freq: Every day | ORAL | 2 refills | Status: AC

## 2023-12-07 NOTE — Telephone Encounter (Signed)
*  STAT* If patient is at the pharmacy, call can be transferred to refill team.   1. Which medications need to be refilled? (please list name of each medication and dose if known) amiodarone  (PACERONE ) 200 MG tablet    2. Would you like to learn more about the convenience, safety, & potential cost savings by using the [facility]?      3. Are you open to using the [facility] (Type [facility].  ).   4. Which pharmacy/location (including street and city if local pharmacy) is medication to be sent to?  [facility] [phone] - [place], [place] - [address]       5. Do they need a 30 day or 90 day supply? 90 day  Pt out of medication

## 2023-12-07 NOTE — Telephone Encounter (Signed)
 Clarified what dose pt is taking daily. Refill sent to preferred pharmacy.

## 2023-12-19 ENCOUNTER — Telehealth: Payer: Self-pay | Admitting: Pulmonary Disease

## 2023-12-19 DIAGNOSIS — G4733 Obstructive sleep apnea (adult) (pediatric): Secondary | ICD-10-CM | POA: Diagnosis not present

## 2023-12-19 NOTE — Telephone Encounter (Signed)
 I called and spoke with the pt and notified of results/recs per AO  Pt verbalized understanding  She wants to go with watchful waiting approach  She wants to know what her AHI was  I do not see the results have been scanned yet Dr Neda, do you still have them and if so what was her AHI? Thanks!

## 2023-12-19 NOTE — Telephone Encounter (Signed)
 Call patient  Sleep study result  Date of study: 12/05/2023  Impression: Mild obstructive sleep apnea with mild oxygen desaturations  Recommendation: Recommendations for Managing Mild Obstructive Sleep Apnea Treatment Options  CPAP Therapy: Consider continuous positive airway pressure (CPAP) therapy for individuals experiencing significant daytime sleepiness or those with comorbid conditions, such as a history of stroke or cardiac disease. If CPAP is selected, an auto-titrating device set between 5 and 15 cm H2O is recommended.  Watchful Waiting: For some patients, a conservative approach may be appropriate. This includes strategies such as weight loss, adjusting sleep position to encourage lateral (side) sleeping, and elevating the head of the bed by approximately 30 degrees.  Oral Appliance Therapy: An oral device may be considered for the treatment of mild sleep-disordered breathing. This option requires referral to a dentist for assessment and fitting. Follow-Up Continue with follow-up as previously scheduled to monitor progress and response to therapy.

## 2023-12-25 NOTE — Progress Notes (Unsigned)
  Electrophysiology Office Note:   Date:  12/26/2023  ID:  Genavive, Kubicki 1955-11-16, MRN [id number]  Primary Cardiologist: Kardie Tobb, DO Primary Heart Failure: None Electrophysiologist: Lavanda Nevels Gladis Norton, MD      History of Present Illness:   Melissa Boyer is a 68 y.o. female with h/o diabetes, hyperlipidemia, atrial fibrillation, sleep apnea, nonobstructive coronary artery disease seen today for routine electrophysiology followup.   She had recurrence of her atrial fibrillation and was post cardioversion 04/14/2023.  She converted to sinus rhythm but has gone back into atrial fibrillation.  Since last being seen in our clinic the patient reports doing overall well.  She is able to do all of her daily activities.  She has no chest pain or shortness of breath.  She remains in atrial fibrillation..  she denies chest pain, palpitations, dyspnea, PND, orthopnea, nausea, vomiting, dizziness, syncope, edema, weight gain, or early satiety.   Review of systems complete and found to be negative unless listed in HPI.   EP Information / Studies Reviewed:    EKG is ordered today. Personal review as below.  EKG Interpretation Date/Time:  Monday December 26 2023 11:56:25 EDT Ventricular Rate:  68 PR Interval:    QRS Duration:  86 QT Interval:  438 QTC Calculation: 465 R Axis:   77  Text Interpretation: Atrial fibrillation Low voltage QRS Septal infarct (cited on or before 17-Aug-2023) Abnormal ECG When compared with ECG of 17-Aug-2023 11:43, No significant change since last tracing Confirmed by Gordie Crumby ([id number]) on 12/26/2023 12:05:55 PM     Risk Assessment/Calculations:    CHA2DS2-VASc Score = 3   This indicates a 3.2% annual risk of stroke. The patient's score is based upon: CHF History: 0 HTN History: 0 Diabetes History: 1 Stroke History: 0 Vascular Disease History: 0 Age Score: 1 Gender Score: 1            Physical Exam:   VS:  BP 108/72   Pulse 68   Ht 5' 6  (1.676 m)   Wt 161 lb 9.6 oz (73.3 kg)   LMP 12/02/2002 (Approximate)   SpO2 97%   BMI 26.08 kg/m    Wt Readings from Last 3 Encounters:  12/26/23 161 lb 9.6 oz (73.3 kg)  11/16/23 171 lb (77.6 kg)  08/17/23 199 lb 9.6 oz (90.5 kg)     GEN: Well nourished, well developed in no acute distress NECK: No JVD; No carotid bruits CARDIAC: Irregularly irregular rate and rhythm, no murmurs, rubs, gallops RESPIRATORY:  Clear to auscultation without rales, wheezing or rhonchi  ABDOMEN: Soft, non-tender, non-distended EXTREMITIES:  No edema; No deformity   ASSESSMENT AND PLAN:    1.  Persistent atrial fibrillation: Post ablation 10/07/2021.  Had multiple atrial flutter circuits at the time of ablation.  She is in atrial fibrillation today.  She is happy with her control.  She does not have much fatigue or shortness of breath.  We did discuss the possibility of getting back into normal rhythm with ablation.  She understands that the longer she is in atrial fibrillation the more difficult it would be to get back into normal rhythm.  For now, she is happy with her control.  2.  Secondary hypercoagulable state: On Eliquis   3.  Obstructive sleep apnea: CPAP compliance encouraged  4.  Hypertension: Well-controlled  Follow up with Dr. Norton as needed   Signed, Shayanna Thatch Gladis Norton, MD

## 2023-12-26 ENCOUNTER — Ambulatory Visit: Attending: Cardiology | Admitting: Cardiology

## 2023-12-26 ENCOUNTER — Encounter: Payer: Self-pay | Admitting: Cardiology

## 2023-12-26 VITALS — BP 108/72 | HR 68 | Ht 66.0 in | Wt 161.6 lb

## 2023-12-26 DIAGNOSIS — I4821 Permanent atrial fibrillation: Secondary | ICD-10-CM

## 2023-12-26 DIAGNOSIS — D6869 Other thrombophilia: Secondary | ICD-10-CM

## 2023-12-26 DIAGNOSIS — I10 Essential (primary) hypertension: Secondary | ICD-10-CM

## 2023-12-26 DIAGNOSIS — G4733 Obstructive sleep apnea (adult) (pediatric): Secondary | ICD-10-CM

## 2023-12-26 NOTE — Patient Instructions (Signed)
 Medication Instructions:  Your physician has recommended you make the following change in your medication:  STOP Amiodarone   *If you need a refill on your cardiac medications before your next appointment, please call your pharmacy*  Lab Work: None ordered   Testing/Procedures: None ordered  Follow-Up: At [facility], you and your health needs are our priority.  As part of our continuing mission to provide you with exceptional heart care, our providers are all part of one team.  This team includes your primary Cardiologist (physician) and Advanced Practice Providers or APPs (Physician Assistants and Nurse Practitioners) who all work together to provide you with the care you need, when you need it.  Your next appointment:   as needed  Provider:   Soyla Norton, MD     Thank you for choosing [facility]!!   Maeola Domino, RN [phone]

## 2024-01-11 DIAGNOSIS — Z6824 Body mass index (BMI) 24.0-24.9, adult: Secondary | ICD-10-CM | POA: Diagnosis not present

## 2024-01-11 DIAGNOSIS — J40 Bronchitis, not specified as acute or chronic: Secondary | ICD-10-CM | POA: Diagnosis not present

## 2024-02-10 ENCOUNTER — Encounter: Payer: Self-pay | Admitting: *Deleted

## 2024-02-14 ENCOUNTER — Ambulatory Visit: Admitting: Cardiology

## 2024-02-21 ENCOUNTER — Other Ambulatory Visit: Payer: Self-pay | Admitting: Cardiology

## 2024-02-29 ENCOUNTER — Other Ambulatory Visit: Payer: Self-pay | Admitting: Cardiology

## 2024-03-20 DIAGNOSIS — E1122 Type 2 diabetes mellitus with diabetic chronic kidney disease: Secondary | ICD-10-CM | POA: Diagnosis not present

## 2024-03-20 DIAGNOSIS — N1832 Chronic kidney disease, stage 3b: Secondary | ICD-10-CM | POA: Diagnosis not present

## 2024-03-20 DIAGNOSIS — E034 Atrophy of thyroid (acquired): Secondary | ICD-10-CM | POA: Diagnosis not present

## 2024-03-20 DIAGNOSIS — I130 Hypertensive heart and chronic kidney disease with heart failure and stage 1 through stage 4 chronic kidney disease, or unspecified chronic kidney disease: Secondary | ICD-10-CM | POA: Diagnosis not present

## 2024-03-20 DIAGNOSIS — E1159 Type 2 diabetes mellitus with other circulatory complications: Secondary | ICD-10-CM | POA: Diagnosis not present

## 2024-03-20 DIAGNOSIS — I4819 Other persistent atrial fibrillation: Secondary | ICD-10-CM | POA: Diagnosis not present

## 2024-03-20 DIAGNOSIS — Z6825 Body mass index (BMI) 25.0-25.9, adult: Secondary | ICD-10-CM | POA: Diagnosis not present

## 2024-03-20 DIAGNOSIS — E782 Mixed hyperlipidemia: Secondary | ICD-10-CM | POA: Diagnosis not present

## 2024-03-20 DIAGNOSIS — I2510 Atherosclerotic heart disease of native coronary artery without angina pectoris: Secondary | ICD-10-CM | POA: Diagnosis not present

## 2024-03-20 DIAGNOSIS — I252 Old myocardial infarction: Secondary | ICD-10-CM | POA: Diagnosis not present

## 2024-03-20 DIAGNOSIS — I509 Heart failure, unspecified: Secondary | ICD-10-CM | POA: Diagnosis not present

## 2024-04-09 DIAGNOSIS — L649 Androgenic alopecia, unspecified: Secondary | ICD-10-CM | POA: Diagnosis not present

## 2024-04-23 ENCOUNTER — Other Ambulatory Visit: Payer: Self-pay | Admitting: Cardiology

## 2024-04-24 NOTE — Telephone Encounter (Signed)
 The patient was asked to verify how she has been taking her furosemide  because at her last visit it was documented that the patient has been taking the furosemide  as needed for fluid.  The patient stated that yes she has been taking the furosemide  as needed and not daily. The patient stated that if she takes it daily it keeps her on the toilet. Is it okay to send in the refill for as needed?

## 2024-05-22 ENCOUNTER — Telehealth: Payer: Self-pay | Admitting: Cardiology

## 2024-05-22 ENCOUNTER — Ambulatory Visit: Admitting: Cardiology

## 2024-05-22 NOTE — Telephone Encounter (Signed)
 Attempted to call patient back, no answer and unable to leave message due to voicemail box full.  MyChart message sent to patient.

## 2024-05-22 NOTE — Telephone Encounter (Signed)
" °  Pt c/o medication issue:  1. Name of Medication:   sacubitril -valsartan  (ENTRESTO ) 49-51 MG      2. How are you currently taking this medication (dosage and times per day)? TAKE 1 TABLET BY MOUTH TWICE DAILY   3. Are you having a reaction (difficulty breathing--STAT)? No   4. What is your medication issue? Patient said, her dose increased from 24-26 mg to 49-51 mg. She wants to clarify if this is the right dose she should be taking   "

## 2024-05-25 ENCOUNTER — Other Ambulatory Visit: Payer: Self-pay | Admitting: Cardiology

## 2024-05-29 ENCOUNTER — Other Ambulatory Visit: Payer: Self-pay

## 2024-05-29 ENCOUNTER — Other Ambulatory Visit: Payer: Self-pay | Admitting: Cardiology

## 2024-05-29 MED ORDER — CARVEDILOL 3.125 MG PO TABS: 3.1250 mg | ORAL_TABLET | Freq: Two times a day (BID) | ORAL | 0 refills | Status: AC

## 2024-05-29 NOTE — Telephone Encounter (Signed)
 Called pt, set up appointment and resent a short refill of Carvedilol  to get her to her appointment. Pt thanked me for calling her.

## 2024-06-14 ENCOUNTER — Ambulatory Visit: Admitting: Cardiology
